# Patient Record
Sex: Male | Born: 1950 | Race: White | Hispanic: No | Marital: Single | Smoking: Former smoker
Health system: Southern US, Community
[De-identification: ages and names within clinical notes are randomized; demographics above are authoritative.]

## PROBLEM LIST (undated history)

## (undated) HISTORY — PX: WISDOM TOOTH EXTRACTION: SHX21

---

## 2015-04-24 ENCOUNTER — Ambulatory Visit (INDEPENDENT_AMBULATORY_CARE_PROVIDER_SITE_OTHER): Payer: Medicare Other

## 2015-04-24 ENCOUNTER — Ambulatory Visit (INDEPENDENT_AMBULATORY_CARE_PROVIDER_SITE_OTHER): Payer: Medicare Other | Admitting: Physician Assistant

## 2015-04-24 ENCOUNTER — Inpatient Hospital Stay (HOSPITAL_COMMUNITY)
Admission: EM | Admit: 2015-04-24 | Discharge: 2015-05-28 | DRG: 215 | Disposition: E | Payer: Medicare Other | Attending: Cardiology | Admitting: Cardiology

## 2015-04-24 ENCOUNTER — Encounter (HOSPITAL_COMMUNITY): Payer: Self-pay | Admitting: *Deleted

## 2015-04-24 ENCOUNTER — Encounter: Payer: Self-pay | Admitting: Physician Assistant

## 2015-04-24 ENCOUNTER — Emergency Department (HOSPITAL_COMMUNITY): Payer: Medicare Other

## 2015-04-24 VITALS — BP 121/79 | HR 73 | Ht 76.0 in | Wt 205.0 lb

## 2015-04-24 DIAGNOSIS — E039 Hypothyroidism, unspecified: Secondary | ICD-10-CM | POA: Diagnosis present

## 2015-04-24 DIAGNOSIS — R002 Palpitations: Secondary | ICD-10-CM

## 2015-04-24 DIAGNOSIS — E872 Acidosis, unspecified: Secondary | ICD-10-CM

## 2015-04-24 DIAGNOSIS — J9690 Respiratory failure, unspecified, unspecified whether with hypoxia or hypercapnia: Secondary | ICD-10-CM

## 2015-04-24 DIAGNOSIS — J9691 Respiratory failure, unspecified with hypoxia: Secondary | ICD-10-CM | POA: Diagnosis present

## 2015-04-24 DIAGNOSIS — Z452 Encounter for adjustment and management of vascular access device: Secondary | ICD-10-CM

## 2015-04-24 DIAGNOSIS — D649 Anemia, unspecified: Secondary | ICD-10-CM | POA: Diagnosis not present

## 2015-04-24 DIAGNOSIS — I480 Paroxysmal atrial fibrillation: Secondary | ICD-10-CM

## 2015-04-24 DIAGNOSIS — I5021 Acute systolic (congestive) heart failure: Secondary | ICD-10-CM | POA: Diagnosis not present

## 2015-04-24 DIAGNOSIS — K56609 Unspecified intestinal obstruction, unspecified as to partial versus complete obstruction: Secondary | ICD-10-CM

## 2015-04-24 DIAGNOSIS — E871 Hypo-osmolality and hyponatremia: Secondary | ICD-10-CM | POA: Diagnosis not present

## 2015-04-24 DIAGNOSIS — I248 Other forms of acute ischemic heart disease: Secondary | ICD-10-CM | POA: Diagnosis present

## 2015-04-24 DIAGNOSIS — Z87891 Personal history of nicotine dependence: Secondary | ICD-10-CM

## 2015-04-24 DIAGNOSIS — N189 Chronic kidney disease, unspecified: Secondary | ICD-10-CM | POA: Diagnosis present

## 2015-04-24 DIAGNOSIS — I4891 Unspecified atrial fibrillation: Secondary | ICD-10-CM | POA: Diagnosis present

## 2015-04-24 DIAGNOSIS — I481 Persistent atrial fibrillation: Secondary | ICD-10-CM

## 2015-04-24 DIAGNOSIS — Z66 Do not resuscitate: Secondary | ICD-10-CM | POA: Diagnosis not present

## 2015-04-24 DIAGNOSIS — Z79899 Other long term (current) drug therapy: Secondary | ICD-10-CM

## 2015-04-24 DIAGNOSIS — R52 Pain, unspecified: Secondary | ICD-10-CM

## 2015-04-24 DIAGNOSIS — J154 Pneumonia due to other streptococci: Secondary | ICD-10-CM | POA: Diagnosis not present

## 2015-04-24 DIAGNOSIS — Z4659 Encounter for fitting and adjustment of other gastrointestinal appliance and device: Secondary | ICD-10-CM

## 2015-04-24 DIAGNOSIS — I071 Rheumatic tricuspid insufficiency: Secondary | ICD-10-CM | POA: Diagnosis present

## 2015-04-24 DIAGNOSIS — R570 Cardiogenic shock: Secondary | ICD-10-CM | POA: Diagnosis present

## 2015-04-24 DIAGNOSIS — Z7901 Long term (current) use of anticoagulants: Secondary | ICD-10-CM

## 2015-04-24 DIAGNOSIS — J9811 Atelectasis: Secondary | ICD-10-CM | POA: Diagnosis present

## 2015-04-24 DIAGNOSIS — N170 Acute kidney failure with tubular necrosis: Secondary | ICD-10-CM | POA: Diagnosis present

## 2015-04-24 DIAGNOSIS — K7290 Hepatic failure, unspecified without coma: Secondary | ICD-10-CM

## 2015-04-24 DIAGNOSIS — Z9289 Personal history of other medical treatment: Secondary | ICD-10-CM

## 2015-04-24 DIAGNOSIS — A419 Sepsis, unspecified organism: Secondary | ICD-10-CM | POA: Diagnosis not present

## 2015-04-24 DIAGNOSIS — R739 Hyperglycemia, unspecified: Secondary | ICD-10-CM | POA: Diagnosis present

## 2015-04-24 DIAGNOSIS — R140 Abdominal distension (gaseous): Secondary | ICD-10-CM

## 2015-04-24 DIAGNOSIS — K7200 Acute and subacute hepatic failure without coma: Secondary | ICD-10-CM | POA: Diagnosis not present

## 2015-04-24 DIAGNOSIS — I4819 Other persistent atrial fibrillation: Secondary | ICD-10-CM

## 2015-04-24 DIAGNOSIS — I509 Heart failure, unspecified: Secondary | ICD-10-CM

## 2015-04-24 DIAGNOSIS — J9601 Acute respiratory failure with hypoxia: Secondary | ICD-10-CM

## 2015-04-24 DIAGNOSIS — K567 Ileus, unspecified: Secondary | ICD-10-CM | POA: Diagnosis not present

## 2015-04-24 DIAGNOSIS — E0781 Sick-euthyroid syndrome: Secondary | ICD-10-CM | POA: Diagnosis present

## 2015-04-24 DIAGNOSIS — R34 Anuria and oliguria: Secondary | ICD-10-CM | POA: Diagnosis not present

## 2015-04-24 DIAGNOSIS — N179 Acute kidney failure, unspecified: Secondary | ICD-10-CM

## 2015-04-24 DIAGNOSIS — I4901 Ventricular fibrillation: Secondary | ICD-10-CM | POA: Diagnosis not present

## 2015-04-24 DIAGNOSIS — Z8673 Personal history of transient ischemic attack (TIA), and cerebral infarction without residual deficits: Secondary | ICD-10-CM

## 2015-04-24 DIAGNOSIS — Z01818 Encounter for other preprocedural examination: Secondary | ICD-10-CM

## 2015-04-24 DIAGNOSIS — E8809 Other disorders of plasma-protein metabolism, not elsewhere classified: Secondary | ICD-10-CM | POA: Diagnosis present

## 2015-04-24 DIAGNOSIS — I130 Hypertensive heart and chronic kidney disease with heart failure and stage 1 through stage 4 chronic kidney disease, or unspecified chronic kidney disease: Secondary | ICD-10-CM | POA: Diagnosis present

## 2015-04-24 DIAGNOSIS — I340 Nonrheumatic mitral (valve) insufficiency: Secondary | ICD-10-CM | POA: Diagnosis present

## 2015-04-24 DIAGNOSIS — R0602 Shortness of breath: Secondary | ICD-10-CM | POA: Diagnosis not present

## 2015-04-24 DIAGNOSIS — R1310 Dysphagia, unspecified: Secondary | ICD-10-CM | POA: Diagnosis not present

## 2015-04-24 DIAGNOSIS — Z515 Encounter for palliative care: Secondary | ICD-10-CM | POA: Diagnosis not present

## 2015-04-24 DIAGNOSIS — D696 Thrombocytopenia, unspecified: Secondary | ICD-10-CM | POA: Diagnosis not present

## 2015-04-24 DIAGNOSIS — G9341 Metabolic encephalopathy: Secondary | ICD-10-CM | POA: Diagnosis not present

## 2015-04-24 DIAGNOSIS — I429 Cardiomyopathy, unspecified: Secondary | ICD-10-CM | POA: Diagnosis present

## 2015-04-24 LAB — CBC WITH DIFFERENTIAL/PLATELET
BASOS ABS: 0 10*3/uL (ref 0.0–0.1)
BASOS PCT: 0 % (ref 0–1)
Eosinophils Absolute: 0.1 10*3/uL (ref 0.0–0.7)
Eosinophils Relative: 1 % (ref 0–5)
HCT: 49.8 % (ref 39.0–52.0)
HEMOGLOBIN: 17.4 g/dL — AB (ref 13.0–17.0)
Lymphocytes Relative: 11 % — ABNORMAL LOW (ref 12–46)
Lymphs Abs: 1.2 10*3/uL (ref 0.7–4.0)
MCH: 32.7 pg (ref 26.0–34.0)
MCHC: 34.9 g/dL (ref 30.0–36.0)
MCV: 93.6 fL (ref 78.0–100.0)
MONOS PCT: 7 % (ref 3–12)
MPV: 10.9 fL (ref 8.6–12.4)
Monocytes Absolute: 0.8 10*3/uL (ref 0.1–1.0)
NEUTROS ABS: 8.8 10*3/uL — AB (ref 1.7–7.7)
NEUTROS PCT: 81 % — AB (ref 43–77)
Platelets: 268 10*3/uL (ref 150–400)
RBC: 5.32 MIL/uL (ref 4.22–5.81)
RDW: 14.5 % (ref 11.5–15.5)
WBC: 10.9 10*3/uL — ABNORMAL HIGH (ref 4.0–10.5)

## 2015-04-24 LAB — CBC
HCT: 52.6 % — ABNORMAL HIGH (ref 39.0–52.0)
Hemoglobin: 18.7 g/dL — ABNORMAL HIGH (ref 13.0–17.0)
MCH: 33 pg (ref 26.0–34.0)
MCHC: 35.6 g/dL (ref 30.0–36.0)
MCV: 92.8 fL (ref 78.0–100.0)
PLATELETS: 260 10*3/uL (ref 150–400)
RBC: 5.67 MIL/uL (ref 4.22–5.81)
RDW: 13.8 % (ref 11.5–15.5)
WBC: 12 10*3/uL — ABNORMAL HIGH (ref 4.0–10.5)

## 2015-04-24 LAB — COMPLETE METABOLIC PANEL WITH GFR
ALT: 84 U/L — AB (ref 9–46)
AST: 44 U/L — ABNORMAL HIGH (ref 10–35)
Albumin: 3.9 g/dL (ref 3.6–5.1)
Alkaline Phosphatase: 130 U/L — ABNORMAL HIGH (ref 40–115)
BILIRUBIN TOTAL: 2.1 mg/dL — AB (ref 0.2–1.2)
BUN: 34 mg/dL — ABNORMAL HIGH (ref 7–25)
CO2: 20 mmol/L (ref 20–31)
Calcium: 9.5 mg/dL (ref 8.6–10.3)
Chloride: 101 mmol/L (ref 98–110)
Creat: 1.73 mg/dL — ABNORMAL HIGH (ref 0.70–1.25)
GFR, EST NON AFRICAN AMERICAN: 41 mL/min — AB (ref >=60)
GFR, Est African American: 47 mL/min — ABNORMAL LOW (ref >=60)
GLUCOSE: 146 mg/dL — AB (ref 65–99)
Potassium: 4.6 mmol/L (ref 3.5–5.3)
SODIUM: 136 mmol/L (ref 135–146)
TOTAL PROTEIN: 7.2 g/dL (ref 6.1–8.1)

## 2015-04-24 LAB — I-STAT TROPONIN, ED: TROPONIN I, POC: 0.08 ng/mL (ref 0.00–0.08)

## 2015-04-24 LAB — BASIC METABOLIC PANEL
Anion gap: 17 — ABNORMAL HIGH (ref 5–15)
BUN: 42 mg/dL — AB (ref 6–20)
CALCIUM: 9.2 mg/dL (ref 8.9–10.3)
CO2: 16 mmol/L — AB (ref 22–32)
CREATININE: 2.21 mg/dL — AB (ref 0.61–1.24)
Chloride: 101 mmol/L (ref 101–111)
GFR calc non Af Amer: 30 mL/min — ABNORMAL LOW (ref >60)
GFR, EST AFRICAN AMERICAN: 34 mL/min — AB (ref >60)
GLUCOSE: 229 mg/dL — AB (ref 65–99)
Potassium: 5.1 mmol/L (ref 3.5–5.1)
Sodium: 134 mmol/L — ABNORMAL LOW (ref 135–145)

## 2015-04-24 LAB — BRAIN NATRIURETIC PEPTIDE: BRAIN NATRIURETIC PEPTIDE: 549.1 pg/mL — AB (ref <100)

## 2015-04-24 LAB — CK TOTAL AND CKMB (NOT AT ARMC)
CK, MB: 11.2 ng/mL — ABNORMAL HIGH (ref 0.0–5.0)
RELATIVE INDEX: 3 (ref 0.0–4.0)
Total CK: 372 U/L — ABNORMAL HIGH (ref 7–232)

## 2015-04-24 LAB — TROPONIN I: TROPONIN I: 0.06 ng/mL — AB (ref <=0.05)

## 2015-04-24 LAB — TSH: TSH: 6 m[IU]/L — AB (ref 0.40–4.50)

## 2015-04-24 MED ORDER — METOPROLOL TARTRATE 50 MG PO TABS: 50.0000 mg | ORAL_TABLET | Freq: Two times a day (BID) | ORAL | Status: AC

## 2015-04-24 MED ORDER — FUROSEMIDE 10 MG/ML IJ SOLN
80.0000 mg | Freq: Once | INTRAMUSCULAR | Status: AC
  Administered 2015-04-25: 80 mg via INTRAVENOUS
  Filled 2015-04-24: qty 8

## 2015-04-24 MED ORDER — APIXABAN 5 MG PO TABS: 5.0000 mg | ORAL_TABLET | Freq: Two times a day (BID) | ORAL | Status: AC

## 2015-04-24 NOTE — ED Provider Notes (Addendum)
CSN: [id number]     Arrival date & time 05/10/15  2207 History   By signing my name below, I, Wesley Tyler, attest that this documentation has been prepared under the direction and in the presence of Wesley Crease, MD.  Electronically Signed: Arlan Tyler, ED Scribe. 05/10/2015. 11:33 PM.   Chief Complaint  Patient presents with  . Shortness of Breath  . Abnormal Lab   The history is provided by the patient. No language interpreter was used.    HPI Comments: Wesley Tyler is a 65 y.o. male with a PMHx of stroke, HTN, and heart attack who presents to the Emergency Department complaining of constant, ongoing shortness of breath x 2.5 weeks; worsened in last few days. Shortness of breath is exacerbated when flat and with exertion. No alleviating factors at this time. Pt also reports new onset lower extremity edema. No OTC/prescribed medications or home remedies attempted prior to arrival. No recent fever, chills, nausea, vomiting, diarrhea, or chest pain. Mr. Wesley Tyler was evaluated at [facility] earlier today and was advised to come to the Emergency Department for further evaluation after abnormal labs came back.  PCP: Wesley Gaw, PA-C    History reviewed. No pertinent past medical history. History reviewed. No pertinent past surgical history. Family History  Problem Relation Age of Onset  . Stroke Mother   . ALS Father   . Heart attack Maternal Aunt   . Hypertension Maternal Aunt    Social History  Substance Use Topics  . Smoking status: Former Games developer  . Smokeless tobacco: None  . Alcohol Use: 4.2 - 8.4 oz/week    7-14 Cans of beer per week     Comment: pt states 1-2 beers per night    Review of Systems  Constitutional: Negative for fever and chills.  Respiratory: Positive for shortness of breath.   Cardiovascular: Positive for leg swelling. Negative for chest pain.  Gastrointestinal: Negative for nausea, vomiting and abdominal pain.  Neurological:  Negative for numbness.  Psychiatric/Behavioral: Negative for confusion.  All other systems reviewed and are negative.     Allergies  Review of patient's allergies indicates no known allergies.  Home Medications   Prior to Admission medications   Medication Sig Start Date End Date Taking? Authorizing Provider  apixaban (ELIQUIS) 5 MG TABS tablet Take 1 tablet (5 mg total) by mouth 2 (two) times daily. May 10, 2015  Yes Jade L Breeback, PA-C  metoprolol (LOPRESSOR) 50 MG tablet Take 1 tablet (50 mg total) by mouth 2 (two) times daily. 05/10/15  Yes Jade L Breeback, PA-C  Multiple Vitamins-Minerals (CENTRUM ADULTS PO) Take 1 tablet by mouth daily.   Yes Historical Provider, MD  Multiple Vitamins-Minerals (EMERGEN-C IMMUNE PO) Take 1 each by mouth daily.   Yes Historical Provider, MD   Triage Vitals: BP 104/76 mmHg  Pulse 101  Temp(Src) 99.3 F (37.4 C) (Core (Comment))  Resp 24  SpO2 96%   Physical Exam  Constitutional: He is oriented to person, place, and time. He appears well-developed and well-nourished. No distress.  HENT:  Head: Normocephalic and atraumatic.  Right Ear: Hearing normal.  Left Ear: Hearing normal.  Nose: Nose normal.  Mouth/Throat: Oropharynx is clear and moist and mucous membranes are normal.  Eyes: Conjunctivae and EOM are normal. Pupils are equal, round, and reactive to light.  Neck: Normal range of motion. Neck supple. JVD present.  Cardiovascular: S1 normal and S2 normal.  An irregular rhythm present. Exam reveals no gallop and no friction rub.  No murmur heard. Pulmonary/Chest: No respiratory distress. He exhibits no tenderness.  Breath sounds are diminished with bilateral crackles at the bases   Abdominal: Soft. Normal appearance and bowel sounds are normal. There is no hepatosplenomegaly. There is no tenderness. There is no rebound, no guarding, no tenderness at McBurney's point and negative Murphy's sign. No hernia.  Musculoskeletal: Normal range of  motion. He exhibits edema.  3+ lower extremity edema noted   Neurological: He is alert and oriented to person, place, and time. He has normal strength. No cranial nerve deficit or sensory deficit. Coordination normal. GCS eye subscore is 4. GCS verbal subscore is 5. GCS motor subscore is 6.  Skin: Skin is warm, dry and intact. No rash noted. No cyanosis.  Psychiatric: He has a normal mood and affect. His speech is normal and behavior is normal. Thought content normal.  Nursing note and vitals reviewed.   ED Course  .Central Line Date/Time: 04/09/2015 5:45 AM Performed by: Wesley CreasePOLLINA, CHRISTOPHER J Authorized by: Wesley CreasePOLLINA, CHRISTOPHER J Consent: Verbal consent obtained. Written consent obtained. Risks and benefits: risks, benefits and alternatives were discussed Consent given by: patient Patient understanding: patient states understanding of the procedure being performed Patient consent: the patient's understanding of the procedure matches consent given Procedure consent: procedure consent matches procedure scheduled Relevant documents: relevant documents present and verified Test results: test results available and properly labeled Site marked: the operative site was marked Imaging studies: imaging studies available Required items: required blood products, implants, devices, and special equipment available Patient identity confirmed: verbally with patient and hospital-assigned identification number Time out: Immediately prior to procedure a "time out" was called to verify the correct patient, procedure, equipment, support staff and site/side marked as required. Indications: vascular access and central pressure monitoring Anesthesia: local infiltration Local anesthetic: lidocaine 1% without epinephrine Anesthetic total: 3 ml Patient sedated: no Preparation: skin prepped with 2% chlorhexidine Skin prep agent dried: skin prep agent completely dried prior to procedure Sterile barriers: all  five maximum sterile barriers used - cap, mask, sterile gown, sterile gloves, and large sterile sheet Hand hygiene: hand hygiene performed prior to central venous catheter insertion Location details: right internal jugular Patient position: flat Catheter type: triple lumen Catheter size: 7.5 Fr Pre-procedure: landmarks identified Ultrasound guidance: yes Sterile ultrasound techniques: sterile gel and sterile probe covers were used Number of attempts: 1 Successful placement: yes Post-procedure: line sutured and dressing applied Assessment: blood return through all ports,  free fluid flow,  no pneumothorax on x-ray and placement verified by x-ray Patient tolerance: Patient tolerated the procedure well with no immediate complications   (including critical care time)  DIAGNOSTIC STUDIES: Oxygen Saturation is 100% on Oxygen, Normal by my interpretation.    COORDINATION OF CARE: 11:28 PM- Will order EKG, blood work, and CXR. Discussed treatment plan with pt at bedside and pt agreed to plan.     Labs Review Labs Reviewed  BASIC METABOLIC PANEL - Abnormal; Notable for the following:    Sodium 134 (*)    CO2 16 (*)    Glucose, Bld 229 (*)    BUN 42 (*)    Creatinine, Ser 2.21 (*)    GFR calc non Af Amer 30 (*)    GFR calc Af Amer 34 (*)    Anion gap 17 (*)    All other components within normal limits  CBC - Abnormal; Notable for the following:    WBC 12.0 (*)    Hemoglobin 18.7 (*)    HCT  52.6 (*)    All other components within normal limits  HEPATIC FUNCTION PANEL - Abnormal; Notable for the following:    AST 189 (*)    ALT 151 (*)    Alkaline Phosphatase 186 (*)    Total Bilirubin 3.9 (*)    Bilirubin, Direct 1.4 (*)    Indirect Bilirubin 2.5 (*)    All other components within normal limits  URINALYSIS, ROUTINE W REFLEX MICROSCOPIC (NOT AT [facility]) - Abnormal; Notable for the following:    Color, Urine AMBER (*)    APPearance CLOUDY (*)    Hgb urine dipstick LARGE (*)     Bilirubin Urine MODERATE (*)    Protein, ur 100 (*)    Leukocytes, UA SMALL (*)    All other components within normal limits  BRAIN NATRIURETIC PEPTIDE - Abnormal; Notable for the following:    B Natriuretic Peptide 612.8 (*)    All other components within normal limits  URINE MICROSCOPIC-ADD ON - Abnormal; Notable for the following:    Squamous Epithelial / LPF 0-5 (*)    Bacteria, UA MANY (*)    Casts HYALINE CASTS (*)    All other components within normal limits  BLOOD GAS, ARTERIAL - Abnormal; Notable for the following:    pH, Arterial 7.459 (*)    pCO2 arterial 17.1 (*)    pO2, Arterial 457 (*)    Bicarbonate 12.1 (*)    Acid-base deficit 8.9 (*)    All other components within normal limits  TROPONIN I - Abnormal; Notable for the following:    Troponin I 0.10 (*)    All other components within normal limits  LACTIC ACID, PLASMA - Abnormal; Notable for the following:    Lactic Acid, Venous 2.7 (*)    All other components within normal limits  I-STAT CG4 LACTIC ACID, ED - Abnormal; Notable for the following:    Lactic Acid, Venous 4.32 (*)    All other components within normal limits  I-STAT CG4 LACTIC ACID, ED - Abnormal; Notable for the following:    Lactic Acid, Venous 4.53 (*)    All other components within normal limits  CBG MONITORING, ED - Abnormal; Notable for the following:    Glucose-Capillary 146 (*)    All other components within normal limits  PROCALCITONIN  T4, FREE  TROPONIN I  LACTIC ACID, PLASMA  HEMOGLOBIN A1C  HEPARIN LEVEL (UNFRACTIONATED)  APTT  CARBOXYHEMOGLOBIN  I-STAT TROPOININ, ED    Imaging Review Dg Chest 2 View  04/12/2015  CLINICAL DATA:  Shortness of breath, decreased oxygen saturation, atrial fibrillation. Former smoker. EXAM: CHEST  2 VIEW COMPARISON:  None in PACs FINDINGS: The lungs are adequately inflated. There small bilateral pleural effusions. The pulmonary interstitial markings are increased. The heart is top-normal in size. The  central pulmonary vascularity is prominent. There is mild loss of height of the body of approximately T8. IMPRESSION: Small bilateral pleural effusions with mild interstitial prominence suggests low-grade CHF. Chronic interstitial prominence could also be secondary to the patient's smoking history or to early interstitial pneumonia. Without previous studies it is difficult to be more precise. Electronically Signed   By: David  [place] M.D.   On: 04/05/2015 16:56   Dg Chest Portable 1 View  04/27/2015  CLINICAL DATA:  Central line placement.  Initial encounter. EXAM: PORTABLE CHEST 1 VIEW COMPARISON:  Chest radiograph 03/27/2015 FINDINGS: The patient's right IJ line is seen ending about the distal SVC. A small right pleural effusion is noted,  with associated right basilar airspace opacity likely reflecting atelectasis. There is mild elevation of the left hemidiaphragm. No pneumothorax is seen. The cardiomediastinal silhouette is borderline enlarged. No acute osseous abnormalities are identified. IMPRESSION: 1. Right IJ line noted ending about the distal SVC. 2. Small right pleural effusion, with associated right basilar airspace opacity likely reflecting atelectasis. Mild elevation of the left hemidiaphragm. 3. Borderline cardiomegaly. Electronically Signed   By: Roanna Raider M.D.   On: 2015-05-04 05:51   Dg Chest Port 1 View  04/02/2015  CLINICAL DATA:  Acute onset of hypotension and hypothermia. Atrial fibrillation. Initial encounter. EXAM: PORTABLE CHEST 1 VIEW COMPARISON:  Chest radiograph performed earlier today at 4:58 p.m. FINDINGS: The lungs are well-aerated. Mild bibasilar opacities likely reflect atelectasis. There is no evidence of pleural effusion or pneumothorax. The cardiomediastinal silhouette is borderline normal in size. No acute osseous abnormalities are seen. IMPRESSION: Mild bibasilar airspace opacities likely reflect atelectasis. Lungs otherwise clear. Electronically Signed   By: Roanna Raider M.D.   On: 04/01/2015 23:51   I have personally reviewed and evaluated these images and lab results as part of my medical decision-making.   EKG Interpretation   Date/Time:  Tuesday April 24 2015 22:25:02 EDT Ventricular Rate:  89 PR Interval:    QRS Duration: 177 QT Interval:  477 QTC Calculation: 580 R Axis:   -50 Text Interpretation:  Atrial fibrillation Left bundle branch block  Baseline wander in lead(s) V3 No previous tracing Confirmed by POLLINA   MD, CHRISTOPHER [phone]) on May 04, 2015 12:45:03 AM      MDM   Final diagnoses:  Acute congestive heart failure, unspecified congestive heart failure type (HCC)  AKI (acute kidney injury) (HCC)  Persistent atrial fibrillation (HCC)  Acute respiratory failure with hypoxia (HCC)  Lactic acidosis   Patient referred to the ER from urgent care. Patient reports that he has been experiencing heart palpitations and shortness of breath for 2-1/2 weeks. He has noticed progressive swelling of his legs over this period of time. He does not have any cardiac history. He visited urgent care and was found to be in atrial fibrillation with rapid ventricular response. He was started on Lopressor and Eliquis, arrangements were made for him to follow with cardiology. Patient had blood work drawn at the urgent care, however, and results showed elevation of troponin. Patient was called at home and told to go to the ER.  Upon arrival, patient appears acutely ill. He is dyspneic and exhibiting signs of significant right heart failure. He was administered IV Lasix upon initial evaluation.  Bloodwork reveals anion gap acidosis. Lactate returned at 4.3. He has an elevated BNP. There is elevated LFTs and acute kidney injury as well.  Discussed with Dr. Arsenio Loader, critical care. Patient will be seen in the ER for further management, but we discussed initiating pressures. We agreed on dobutamine because the patient appears to be exhibiting acute congestive  heart failure. A central line was placed by myself without difficulty.  CRITICAL CARE Performed by: Wesley Tyler   Total critical care time: 60 minutes  Critical care time was exclusive of separately billable procedures and treating other patients.  Critical care was necessary to treat or prevent imminent or life-threatening deterioration.  Critical care was time spent personally by me on the following activities: development of treatment plan with patient and/or surrogate as well as nursing, discussions with consultants, evaluation of patient's response to treatment, examination of patient, obtaining history from patient or surrogate, ordering and  performing treatments and interventions, ordering and review of laboratory studies, ordering and review of radiographic studies, pulse oximetry and re-evaluation of patient's condition.    I personally performed the services described in this documentation, which was scribed in my presence. The recorded information has been reviewed and is accurate.   Wesley Crease, MD 04/06/2015 [id number]  Wesley Crease, MD 04/12/2015 [phone]

## 2015-04-24 NOTE — ED Notes (Signed)
Pt reports that he has been short of breath x 2 weeks that has been getting progressively worse; pt states that the shortness of breath is worse with exertion; pt states that he has not been seen by a physician in years and [facility]; pt states that the MD called and advised that pt needed to be seen tonight for abnormal blood work

## 2015-04-24 NOTE — Patient Instructions (Signed)

## 2015-04-24 NOTE — Progress Notes (Signed)
Subjective:    Patient ID: Wesley Tyler, male    DOB: 06/27/1950, 65 y.o.   MRN: [id number]  HPI Pt is a 65 yo male who presents to the clinic to establish care. He comes with his cousin who made him come today.   He denies any past medication history or taking any medications.   .. Family History  Problem Relation Age of Onset  . Stroke Mother   . ALS Father   . Heart attack Maternal Aunt   . Hypertension Maternal Aunt    . Social History   Social History  . Marital Status: Single    Spouse Name: N/A  . Number of Children: N/A  . Years of Education: N/A   Occupational History  . Not on file.   Social History Main Topics  . Smoking status: Former Games developermoker  . Smokeless tobacco: Not on file  . Alcohol Use: No  . Drug Use: No  . Sexual Activity: No   Other Topics Concern  . Not on file   Social History Narrative  . No narrative on file   He does not smoke and has been quit since 1977.  Pt has not seen health care provider since 20's. Pt reports 1 plus years of occasional palpations that are off and on. He reports they have progressively gotten worse over the past 6 months. For the past 2 weeks his heart has been continually beating "fast and hard". He usually can eat some cashews and helps but nothing has helped for the past 2 weeks. He is very SOB and notice that his ankles are swelling more. He has a cough that is mildly productive. No wheezing. Not taken anything to make better. No fever, body aches or chills. No CP.    Review of Systems    see HPI. Objective:   Physical Exam  Constitutional: He is oriented to person, place, and time.  Pale, diaphoretic, labored breathing.   HENT:  Head: Normocephalic and atraumatic.  Right Ear: External ear normal.  Left Ear: External ear normal.  Nose: Nose normal.  Mouth/Throat: Oropharynx is clear and moist. No oropharyngeal exudate.  Eyes: Conjunctivae are normal. Right eye exhibits no discharge. Left eye exhibits  no discharge.  Neck: Normal range of motion. Neck supple. No JVD present. No tracheal deviation present. No thyromegaly present.  Cardiovascular:  Tachycardia irregular irregular rhythm EKG confirms Atrial fibrillation.  Pulmonary/Chest: No stridor.  Decreased effort.  No wheezing or rhonchi. Base of right lung heard sparse crackles.  Lymphadenopathy:    He has no cervical adenopathy.  Neurological: He is alert and oriented to person, place, and time.  Skin: Skin is dry.  2 + pitting edema lower bilateral extremity.   Psychiatric: He has a normal mood and affect. His behavior is normal.          Assessment & Plan:  Paroxysmal atrial fibrillation likely converted to persistent atrial fibrillation in the past 2 weeks-  EKG confirmed irregular tachycardic HR at 132. QRS widening.  Pulse ox 94 percent. Started on metoprolol 50mg  bid.  Eliquis 5mg  bid. Coupon card given. Stat labs to check TSH, CBC, CMP, BNP, cardiac enzymes, Troponin. STAT CXR to rule out any complication factor such as pneumonia.  Pt has Cardiologist appt in [place] at 9am with Dr. Cliffton AstersWhite.  Current CHADs score is low at 1 but many variable we do not know since has not seen doctor since 20's.  Follow up with myself in 4 weeks.  Discussed with patient need to also consider lipid checking for better management of chronic conditions and risk assessment.

## 2015-04-25 ENCOUNTER — Encounter (HOSPITAL_COMMUNITY): Payer: Self-pay | Admitting: Cardiology

## 2015-04-25 ENCOUNTER — Inpatient Hospital Stay (HOSPITAL_COMMUNITY): Payer: Medicare Other

## 2015-04-25 ENCOUNTER — Other Ambulatory Visit (HOSPITAL_COMMUNITY): Payer: Medicare Other

## 2015-04-25 ENCOUNTER — Encounter (HOSPITAL_COMMUNITY): Admission: EM | Disposition: E | Payer: Self-pay | Source: Home / Self Care | Attending: Pulmonary Disease

## 2015-04-25 DIAGNOSIS — I509 Heart failure, unspecified: Secondary | ICD-10-CM

## 2015-04-25 DIAGNOSIS — R079 Chest pain, unspecified: Secondary | ICD-10-CM | POA: Diagnosis not present

## 2015-04-25 DIAGNOSIS — A419 Sepsis, unspecified organism: Secondary | ICD-10-CM | POA: Diagnosis not present

## 2015-04-25 DIAGNOSIS — Z515 Encounter for palliative care: Secondary | ICD-10-CM | POA: Diagnosis not present

## 2015-04-25 DIAGNOSIS — E8729 Other acidosis: Secondary | ICD-10-CM | POA: Insufficient documentation

## 2015-04-25 DIAGNOSIS — I5021 Acute systolic (congestive) heart failure: Secondary | ICD-10-CM | POA: Diagnosis present

## 2015-04-25 DIAGNOSIS — R0602 Shortness of breath: Secondary | ICD-10-CM | POA: Diagnosis present

## 2015-04-25 DIAGNOSIS — I482 Chronic atrial fibrillation: Secondary | ICD-10-CM | POA: Diagnosis not present

## 2015-04-25 DIAGNOSIS — E8809 Other disorders of plasma-protein metabolism, not elsewhere classified: Secondary | ICD-10-CM | POA: Diagnosis present

## 2015-04-25 DIAGNOSIS — N189 Chronic kidney disease, unspecified: Secondary | ICD-10-CM | POA: Diagnosis present

## 2015-04-25 DIAGNOSIS — E0781 Sick-euthyroid syndrome: Secondary | ICD-10-CM | POA: Diagnosis present

## 2015-04-25 DIAGNOSIS — E872 Acidosis: Secondary | ICD-10-CM

## 2015-04-25 DIAGNOSIS — I071 Rheumatic tricuspid insufficiency: Secondary | ICD-10-CM | POA: Diagnosis present

## 2015-04-25 DIAGNOSIS — J810 Acute pulmonary edema: Secondary | ICD-10-CM | POA: Diagnosis not present

## 2015-04-25 DIAGNOSIS — Z79899 Other long term (current) drug therapy: Secondary | ICD-10-CM | POA: Diagnosis not present

## 2015-04-25 DIAGNOSIS — G9341 Metabolic encephalopathy: Secondary | ICD-10-CM | POA: Diagnosis not present

## 2015-04-25 DIAGNOSIS — N179 Acute kidney failure, unspecified: Secondary | ICD-10-CM | POA: Insufficient documentation

## 2015-04-25 DIAGNOSIS — R1310 Dysphagia, unspecified: Secondary | ICD-10-CM | POA: Diagnosis not present

## 2015-04-25 DIAGNOSIS — J9601 Acute respiratory failure with hypoxia: Secondary | ICD-10-CM | POA: Diagnosis not present

## 2015-04-25 DIAGNOSIS — D696 Thrombocytopenia, unspecified: Secondary | ICD-10-CM | POA: Diagnosis not present

## 2015-04-25 DIAGNOSIS — I481 Persistent atrial fibrillation: Secondary | ICD-10-CM | POA: Diagnosis not present

## 2015-04-25 DIAGNOSIS — I4891 Unspecified atrial fibrillation: Secondary | ICD-10-CM | POA: Diagnosis present

## 2015-04-25 DIAGNOSIS — R34 Anuria and oliguria: Secondary | ICD-10-CM | POA: Diagnosis not present

## 2015-04-25 DIAGNOSIS — K7200 Acute and subacute hepatic failure without coma: Secondary | ICD-10-CM | POA: Diagnosis not present

## 2015-04-25 DIAGNOSIS — E039 Hypothyroidism, unspecified: Secondary | ICD-10-CM | POA: Diagnosis present

## 2015-04-25 DIAGNOSIS — I351 Nonrheumatic aortic (valve) insufficiency: Secondary | ICD-10-CM | POA: Diagnosis not present

## 2015-04-25 DIAGNOSIS — I480 Paroxysmal atrial fibrillation: Secondary | ICD-10-CM | POA: Diagnosis not present

## 2015-04-25 DIAGNOSIS — J154 Pneumonia due to other streptococci: Secondary | ICD-10-CM | POA: Diagnosis not present

## 2015-04-25 DIAGNOSIS — I248 Other forms of acute ischemic heart disease: Secondary | ICD-10-CM | POA: Diagnosis present

## 2015-04-25 DIAGNOSIS — D649 Anemia, unspecified: Secondary | ICD-10-CM | POA: Diagnosis not present

## 2015-04-25 DIAGNOSIS — I130 Hypertensive heart and chronic kidney disease with heart failure and stage 1 through stage 4 chronic kidney disease, or unspecified chronic kidney disease: Secondary | ICD-10-CM | POA: Diagnosis present

## 2015-04-25 DIAGNOSIS — R739 Hyperglycemia, unspecified: Secondary | ICD-10-CM | POA: Diagnosis present

## 2015-04-25 DIAGNOSIS — N170 Acute kidney failure with tubular necrosis: Secondary | ICD-10-CM | POA: Diagnosis present

## 2015-04-25 DIAGNOSIS — K567 Ileus, unspecified: Secondary | ICD-10-CM | POA: Diagnosis not present

## 2015-04-25 DIAGNOSIS — I361 Nonrheumatic tricuspid (valve) insufficiency: Secondary | ICD-10-CM | POA: Diagnosis not present

## 2015-04-25 DIAGNOSIS — G9340 Encephalopathy, unspecified: Secondary | ICD-10-CM | POA: Diagnosis not present

## 2015-04-25 DIAGNOSIS — R570 Cardiogenic shock: Secondary | ICD-10-CM

## 2015-04-25 DIAGNOSIS — I429 Cardiomyopathy, unspecified: Secondary | ICD-10-CM | POA: Diagnosis present

## 2015-04-25 DIAGNOSIS — Z8673 Personal history of transient ischemic attack (TIA), and cerebral infarction without residual deficits: Secondary | ICD-10-CM | POA: Diagnosis not present

## 2015-04-25 DIAGNOSIS — Z66 Do not resuscitate: Secondary | ICD-10-CM | POA: Diagnosis not present

## 2015-04-25 DIAGNOSIS — Z87891 Personal history of nicotine dependence: Secondary | ICD-10-CM | POA: Diagnosis not present

## 2015-04-25 DIAGNOSIS — I4901 Ventricular fibrillation: Secondary | ICD-10-CM | POA: Diagnosis not present

## 2015-04-25 DIAGNOSIS — J9691 Respiratory failure, unspecified with hypoxia: Secondary | ICD-10-CM | POA: Diagnosis present

## 2015-04-25 DIAGNOSIS — Z7901 Long term (current) use of anticoagulants: Secondary | ICD-10-CM | POA: Diagnosis not present

## 2015-04-25 DIAGNOSIS — J9811 Atelectasis: Secondary | ICD-10-CM | POA: Diagnosis present

## 2015-04-25 DIAGNOSIS — E871 Hypo-osmolality and hyponatremia: Secondary | ICD-10-CM | POA: Diagnosis not present

## 2015-04-25 DIAGNOSIS — I340 Nonrheumatic mitral (valve) insufficiency: Secondary | ICD-10-CM | POA: Diagnosis present

## 2015-04-25 HISTORY — PX: CARDIAC CATHETERIZATION: SHX172

## 2015-04-25 LAB — URINALYSIS, ROUTINE W REFLEX MICROSCOPIC
GLUCOSE, UA: NEGATIVE mg/dL
KETONES UR: NEGATIVE mg/dL
Nitrite: NEGATIVE
PROTEIN: 100 mg/dL — AB
Specific Gravity, Urine: 1.021 (ref 1.005–1.030)
pH: 5 (ref 5.0–8.0)

## 2015-04-25 LAB — POCT I-STAT 3, VENOUS BLOOD GAS (G3P V)
ACID-BASE DEFICIT: 6 mmol/L — AB (ref 0.0–2.0)
Acid-base deficit: 6 mmol/L — ABNORMAL HIGH (ref 0.0–2.0)
BICARBONATE: 19.9 meq/L — AB (ref 20.0–24.0)
BICARBONATE: 20 meq/L (ref 20.0–24.0)
O2 SAT: 65 %
O2 Saturation: 63 %
PCO2 VEN: 41 mmHg — AB (ref 45.0–50.0)
PO2 VEN: 37 mmHg (ref 31.0–45.0)
TCO2: 21 mmol/L (ref 0–100)
TCO2: 21 mmol/L (ref 0–100)
pCO2, Ven: 40.7 mmHg — ABNORMAL LOW (ref 45.0–50.0)
pH, Ven: 7.295 (ref 7.250–7.300)
pH, Ven: 7.299 (ref 7.250–7.300)
pO2, Ven: 36 mmHg (ref 31.0–45.0)

## 2015-04-25 LAB — CBC WITH DIFFERENTIAL/PLATELET
BASOS ABS: 0 10*3/uL (ref 0.0–0.1)
BASOS PCT: 0 %
Basophils Absolute: 0 10*3/uL (ref 0.0–0.1)
Basophils Relative: 0 %
EOS ABS: 0 10*3/uL (ref 0.0–0.7)
Eosinophils Absolute: 0 10*3/uL (ref 0.0–0.7)
Eosinophils Relative: 0 %
Eosinophils Relative: 0 %
HCT: 49.4 % (ref 39.0–52.0)
HCT: 44.7 % (ref 39.0–52.0)
HEMOGLOBIN: 17.2 g/dL — AB (ref 13.0–17.0)
Hemoglobin: 15.1 g/dL (ref 13.0–17.0)
LYMPHS ABS: 1.2 10*3/uL (ref 0.7–4.0)
LYMPHS PCT: 7 %
Lymphocytes Relative: 9 %
Lymphs Abs: 1.1 10*3/uL (ref 0.7–4.0)
MCH: 32.6 pg (ref 26.0–34.0)
MCH: 31.4 pg (ref 26.0–34.0)
MCHC: 34.8 g/dL (ref 30.0–36.0)
MCHC: 33.8 g/dL (ref 30.0–36.0)
MCV: 93.6 fL (ref 78.0–100.0)
MCV: 92.9 fL (ref 78.0–100.0)
MONO ABS: 0.8 10*3/uL (ref 0.1–1.0)
MONOS PCT: 6 %
Monocytes Absolute: 1.8 10*3/uL — ABNORMAL HIGH (ref 0.1–1.0)
Monocytes Relative: 11 %
NEUTROS PCT: 82 %
Neutro Abs: 13.1 10*3/uL — ABNORMAL HIGH (ref 1.7–7.7)
Neutro Abs: 10.6 10*3/uL — ABNORMAL HIGH (ref 1.7–7.7)
Neutrophils Relative %: 85 %
PLATELETS: 262 10*3/uL (ref 150–400)
PLATELETS: 217 10*3/uL (ref 150–400)
RBC: 5.28 MIL/uL (ref 4.22–5.81)
RBC: 4.81 MIL/uL (ref 4.22–5.81)
RDW: 13.8 % (ref 11.5–15.5)
RDW: 13.4 % (ref 11.5–15.5)
WBC: 16.1 10*3/uL — AB (ref 4.0–10.5)
WBC: 12.6 10*3/uL — ABNORMAL HIGH (ref 4.0–10.5)

## 2015-04-25 LAB — COMPREHENSIVE METABOLIC PANEL
ALBUMIN: 3 g/dL — AB (ref 3.5–5.0)
ALK PHOS: 136 U/L — AB (ref 38–126)
ALT: 765 U/L — ABNORMAL HIGH (ref 17–63)
ALT: 721 U/L — AB (ref 17–63)
AST: 1012 U/L — ABNORMAL HIGH (ref 15–41)
AST: 1177 U/L — ABNORMAL HIGH (ref 15–41)
Albumin: 3.3 g/dL — ABNORMAL LOW (ref 3.5–5.0)
Alkaline Phosphatase: 156 U/L — ABNORMAL HIGH (ref 38–126)
Anion gap: 13 (ref 5–15)
Anion gap: 15 (ref 5–15)
BILIRUBIN TOTAL: 2.4 mg/dL — AB (ref 0.3–1.2)
BILIRUBIN TOTAL: 2.5 mg/dL — AB (ref 0.3–1.2)
BUN: 50 mg/dL — ABNORMAL HIGH (ref 6–20)
BUN: 55 mg/dL — ABNORMAL HIGH (ref 6–20)
CALCIUM: 8.4 mg/dL — AB (ref 8.9–10.3)
CHLORIDE: 104 mmol/L (ref 101–111)
CO2: 19 mmol/L — ABNORMAL LOW (ref 22–32)
CO2: 18 mmol/L — AB (ref 22–32)
CREATININE: 2.81 mg/dL — AB (ref 0.61–1.24)
CREATININE: 2.93 mg/dL — AB (ref 0.61–1.24)
Calcium: 9 mg/dL (ref 8.9–10.3)
Chloride: 103 mmol/L (ref 101–111)
GFR calc non Af Amer: 21 mL/min — ABNORMAL LOW (ref >60)
GFR, EST AFRICAN AMERICAN: 26 mL/min — AB (ref >60)
GFR, EST AFRICAN AMERICAN: 24 mL/min — AB (ref >60)
GFR, EST NON AFRICAN AMERICAN: 22 mL/min — AB (ref >60)
GLUCOSE: 147 mg/dL — AB (ref 65–99)
Glucose, Bld: 142 mg/dL — ABNORMAL HIGH (ref 65–99)
POTASSIUM: 5.2 mmol/L — AB (ref 3.5–5.1)
Potassium: 4.6 mmol/L (ref 3.5–5.1)
SODIUM: 136 mmol/L (ref 135–145)
Sodium: 136 mmol/L (ref 135–145)
TOTAL PROTEIN: 6.3 g/dL — AB (ref 6.5–8.1)
TOTAL PROTEIN: 5.8 g/dL — AB (ref 6.5–8.1)

## 2015-04-25 LAB — I-STAT CG4 LACTIC ACID, ED
LACTIC ACID, VENOUS: 4.32 mmol/L — AB (ref 0.5–2.0)
Lactic Acid, Venous: 4.53 mmol/L (ref 0.5–2.0)

## 2015-04-25 LAB — HEPATIC FUNCTION PANEL
ALT: 151 U/L — ABNORMAL HIGH (ref 17–63)
AST: 189 U/L — ABNORMAL HIGH (ref 15–41)
Albumin: 4 g/dL (ref 3.5–5.0)
Alkaline Phosphatase: 186 U/L — ABNORMAL HIGH (ref 38–126)
BILIRUBIN DIRECT: 1.4 mg/dL — AB (ref 0.1–0.5)
BILIRUBIN TOTAL: 3.9 mg/dL — AB (ref 0.3–1.2)
Indirect Bilirubin: 2.5 mg/dL — ABNORMAL HIGH (ref 0.3–0.9)
Total Protein: 7.9 g/dL (ref 6.5–8.1)

## 2015-04-25 LAB — ECHOCARDIOGRAM LIMITED
HEIGHTINCHES: 76 in
Weight: 3559.11 oz

## 2015-04-25 LAB — BLOOD GAS, ARTERIAL
Acid-base deficit: 8.9 mmol/L — ABNORMAL HIGH (ref 0.0–2.0)
BICARBONATE: 12.1 meq/L — AB (ref 20.0–24.0)
FIO2: 1
O2 Content: 15 L/min
O2 SAT: 99.8 %
Patient temperature: 97.5
TCO2: 10 mmol/L (ref 0–100)
pCO2 arterial: 17.1 mmHg — CL (ref 35.0–45.0)
pH, Arterial: 7.459 — ABNORMAL HIGH (ref 7.350–7.450)
pO2, Arterial: 457 mmHg — ABNORMAL HIGH (ref 80.0–100.0)

## 2015-04-25 LAB — CARBOXYHEMOGLOBIN
CARBOXYHEMOGLOBIN: 0.7 % (ref 0.5–1.5)
CARBOXYHEMOGLOBIN: 0.6 % (ref 0.5–1.5)
METHEMOGLOBIN: 0.9 % (ref 0.0–1.5)
Methemoglobin: 0.8 % (ref 0.0–1.5)
O2 SAT: 35.7 %
O2 Saturation: 77.5 %
TOTAL HEMOGLOBIN: 16.2 g/dL (ref 13.5–18.0)
Total hemoglobin: 17.4 g/dL (ref 13.5–18.0)

## 2015-04-25 LAB — GLUCOSE, CAPILLARY: Glucose-Capillary: 133 mg/dL — ABNORMAL HIGH (ref 65–99)

## 2015-04-25 LAB — CBG MONITORING, ED
GLUCOSE-CAPILLARY: 146 mg/dL — AB (ref 65–99)
GLUCOSE-CAPILLARY: 149 mg/dL — AB (ref 65–99)

## 2015-04-25 LAB — PROCALCITONIN: Procalcitonin: 0.16 ng/mL

## 2015-04-25 LAB — POCT ACTIVATED CLOTTING TIME
ACTIVATED CLOTTING TIME: 219 s
ACTIVATED CLOTTING TIME: 142 s
Activated Clotting Time: 204 seconds
Activated Clotting Time: 157 seconds

## 2015-04-25 LAB — URINE MICROSCOPIC-ADD ON

## 2015-04-25 LAB — MAGNESIUM: MAGNESIUM: 2.8 mg/dL — AB (ref 1.7–2.4)

## 2015-04-25 LAB — TRIGLYCERIDES: Triglycerides: 13 mg/dL (ref <150)

## 2015-04-25 LAB — BRAIN NATRIURETIC PEPTIDE: B Natriuretic Peptide: 612.8 pg/mL — ABNORMAL HIGH (ref 0.0–100.0)

## 2015-04-25 LAB — T4, FREE: Free T4: 1.17 ng/dL — ABNORMAL HIGH (ref 0.61–1.12)

## 2015-04-25 LAB — MRSA PCR SCREENING: MRSA by PCR: NEGATIVE

## 2015-04-25 LAB — PHOSPHORUS: Phosphorus: 6.4 mg/dL — ABNORMAL HIGH (ref 2.5–4.6)

## 2015-04-25 LAB — TROPONIN I: Troponin I: 0.1 ng/mL — ABNORMAL HIGH (ref <0.031)

## 2015-04-25 LAB — LACTIC ACID, PLASMA: Lactic Acid, Venous: 2.7 mmol/L (ref 0.5–2.0)

## 2015-04-25 SURGERY — RIGHT HEART CATH

## 2015-04-25 MED ORDER — HEPARIN SODIUM (PORCINE) 1000 UNIT/ML IJ SOLN
INTRAMUSCULAR | Status: DC | PRN
  Administered 2015-04-25: 3000 [IU] via INTRAVENOUS
  Administered 2015-04-25: 6000 [IU] via INTRAVENOUS

## 2015-04-25 MED ORDER — AMIODARONE HCL IN DEXTROSE 360-4.14 MG/200ML-% IV SOLN
60.0000 mg/h | INTRAVENOUS | Status: AC
  Administered 2015-04-25: 60 mg/h via INTRAVENOUS

## 2015-04-25 MED ORDER — HEPARIN (PORCINE) IN NACL 2-0.9 UNIT/ML-% IJ SOLN
INTRAMUSCULAR | Status: AC
  Filled 2015-04-25: qty 500

## 2015-04-25 MED ORDER — METOPROLOL TARTRATE 1 MG/ML IV SOLN: 2.5000 mg | INTRAVENOUS | Status: DC | PRN

## 2015-04-25 MED ORDER — DOBUTAMINE IN D5W 4-5 MG/ML-% IV SOLN: 1.0000 ug/kg/min | INTRAVENOUS | Status: DC

## 2015-04-25 MED ORDER — SODIUM CHLORIDE 0.9 % IV SOLN: INTRAVENOUS | Status: DC

## 2015-04-25 MED ORDER — SODIUM CHLORIDE 0.9% FLUSH: 3.0000 mL | Freq: Two times a day (BID) | INTRAVENOUS | Status: DC

## 2015-04-25 MED ORDER — SODIUM CHLORIDE 0.9% FLUSH
3.0000 mL | Freq: Two times a day (BID) | INTRAVENOUS | Status: DC
  Administered 2015-04-29 – 2015-05-02 (×2): 3 mL via INTRAVENOUS

## 2015-04-25 MED ORDER — MIDAZOLAM HCL 2 MG/2ML IJ SOLN
INTRAMUSCULAR | Status: AC
  Filled 2015-04-25: qty 2

## 2015-04-25 MED ORDER — ONDANSETRON HCL 4 MG/2ML IJ SOLN: 4.0000 mg | Freq: Four times a day (QID) | INTRAMUSCULAR | Status: DC | PRN

## 2015-04-25 MED ORDER — LIDOCAINE HCL (PF) 1 % IJ SOLN
INTRAMUSCULAR | Status: DC | PRN
  Administered 2015-04-25: 5 mL
  Administered 2015-04-25 (×2): 20 mL

## 2015-04-25 MED ORDER — DOBUTAMINE IN D5W 4-5 MG/ML-% IV SOLN
2.5000 ug/kg/min | INTRAVENOUS | Status: DC
  Administered 2015-04-25: 2.5 ug/kg/min via INTRAVENOUS
  Filled 2015-04-25: qty 250

## 2015-04-25 MED ORDER — LIDOCAINE HCL (PF) 1 % IJ SOLN
INTRAMUSCULAR | Status: AC
  Filled 2015-04-25: qty 30

## 2015-04-25 MED ORDER — PANTOPRAZOLE SODIUM 40 MG IV SOLR
40.0000 mg | INTRAVENOUS | Status: DC
  Administered 2015-04-25 – 2015-04-29 (×5): 40 mg via INTRAVENOUS
  Filled 2015-04-25 (×5): qty 40

## 2015-04-25 MED ORDER — FENTANYL CITRATE (PF) 100 MCG/2ML IJ SOLN
INTRAMUSCULAR | Status: DC | PRN
  Administered 2015-04-25: 25 ug via INTRAVENOUS

## 2015-04-25 MED ORDER — SODIUM CHLORIDE 0.9 % IV SOLN
25.0000 ug/h | INTRAVENOUS | Status: DC
  Administered 2015-04-25: 25 ug/h via INTRAVENOUS
  Administered 2015-04-26: 150 ug/h via INTRAVENOUS
  Filled 2015-04-25 (×4): qty 50

## 2015-04-25 MED ORDER — MIDAZOLAM HCL 2 MG/2ML IJ SOLN
1.0000 mg | INTRAMUSCULAR | Status: DC | PRN
Start: 2015-04-25 — End: 2015-04-28
  Administered 2015-04-25: 2 mg via INTRAVENOUS
  Filled 2015-04-25: qty 2

## 2015-04-25 MED ORDER — IOPAMIDOL (ISOVUE-370) INJECTION 76%
INTRAVENOUS | Status: DC | PRN
  Administered 2015-04-25: 25 mL via INTRA_ARTERIAL

## 2015-04-25 MED ORDER — DEXTROSE 5 % IV SOLN
0.0000 ug/min | INTRAVENOUS | Status: DC
  Administered 2015-04-25: 20 ug/min via INTRAVENOUS
  Administered 2015-04-25: 10 ug/min via INTRAVENOUS
  Administered 2015-04-26: 25 ug/min via INTRAVENOUS
  Administered 2015-04-26: 50 ug/min via INTRAVENOUS
  Administered 2015-04-27: 44 ug/min via INTRAVENOUS
  Administered 2015-04-27: 42 ug/min via INTRAVENOUS
  Administered 2015-04-27: 46 ug/min via INTRAVENOUS
  Administered 2015-04-28 (×2): 45 ug/min via INTRAVENOUS
  Administered 2015-04-28: 38 ug/min via INTRAVENOUS
  Administered 2015-04-29: 36 ug/min via INTRAVENOUS
  Administered 2015-04-29: 28 ug/min via INTRAVENOUS
  Administered 2015-04-29: 27 ug/min via INTRAVENOUS
  Administered 2015-04-30 – 2015-05-01 (×2): 20 ug/min via INTRAVENOUS
  Administered 2015-05-01: 14 ug/min via INTRAVENOUS
  Administered 2015-05-03: 18 ug/min via INTRAVENOUS
  Administered 2015-05-03: 13 ug/min via INTRAVENOUS
  Administered 2015-05-04: 16 ug/min via INTRAVENOUS
  Administered 2015-05-05: 11 ug/min via INTRAVENOUS
  Administered 2015-05-06: 9 ug/min via INTRAVENOUS
  Filled 2015-04-25 (×27): qty 16

## 2015-04-25 MED ORDER — SODIUM CHLORIDE 0.9 % IV SOLN: 250.0000 mL | INTRAVENOUS | Status: DC | PRN

## 2015-04-25 MED ORDER — HEPARIN (PORCINE) IN NACL 100-0.45 UNIT/ML-% IJ SOLN
1600.0000 [IU]/h | INTRAMUSCULAR | Status: DC
  Administered 2015-04-25: 1600 [IU]/h via INTRAVENOUS
  Filled 2015-04-25: qty 250

## 2015-04-25 MED ORDER — PHENYLEPHRINE HCL 10 MG/ML IJ SOLN
30.0000 ug/min | INTRAMUSCULAR | Status: DC
  Administered 2015-04-25: 30 ug/min via INTRAVENOUS
  Filled 2015-04-25: qty 1

## 2015-04-25 MED ORDER — FUROSEMIDE 10 MG/ML IJ SOLN
80.0000 mg | Freq: Three times a day (TID) | INTRAMUSCULAR | Status: DC
  Administered 2015-04-25: 80 mg via INTRAVENOUS
  Filled 2015-04-25 (×2): qty 8

## 2015-04-25 MED ORDER — AMIODARONE HCL IN DEXTROSE 360-4.14 MG/200ML-% IV SOLN
30.0000 mg/h | INTRAVENOUS | Status: DC
  Administered 2015-04-26 – 2015-05-03 (×16): 30 mg/h via INTRAVENOUS
  Filled 2015-04-25 (×19): qty 200

## 2015-04-25 MED ORDER — MILRINONE IN DEXTROSE 20 MG/100ML IV SOLN
0.2500 ug/kg/min | INTRAVENOUS | Status: DC
  Administered 2015-04-25: 0.25 ug/kg/min via INTRAVENOUS
  Administered 2015-04-27 – 2015-04-29 (×3): 0.125 ug/kg/min via INTRAVENOUS
  Administered 2015-04-30 – 2015-05-04 (×8): 0.25 ug/kg/min via INTRAVENOUS
  Filled 2015-04-25 (×14): qty 100

## 2015-04-25 MED ORDER — SODIUM CHLORIDE 0.9% FLUSH
3.0000 mL | Freq: Two times a day (BID) | INTRAVENOUS | Status: DC
  Administered 2015-04-25 – 2015-04-27 (×3): 3 mL via INTRAVENOUS

## 2015-04-25 MED ORDER — SODIUM CHLORIDE 0.9 % IV SOLN
250.0000 mL | INTRAVENOUS | Status: DC | PRN
  Administered 2015-04-25: 10 mL via INTRAVENOUS
  Administered 2015-05-03: 250 mL via INTRAVENOUS

## 2015-04-25 MED ORDER — FUROSEMIDE 10 MG/ML IJ SOLN
40.0000 mg | Freq: Two times a day (BID) | INTRAMUSCULAR | Status: DC
  Administered 2015-04-25: 40 mg via INTRAVENOUS
  Filled 2015-04-25: qty 4

## 2015-04-25 MED ORDER — FENTANYL CITRATE (PF) 100 MCG/2ML IJ SOLN
INTRAMUSCULAR | Status: AC
  Administered 2015-04-25: 200 ug
  Filled 2015-04-25: qty 4

## 2015-04-25 MED ORDER — HEPARIN SODIUM (PORCINE) 1000 UNIT/ML IJ SOLN
INTRAMUSCULAR | Status: AC
  Filled 2015-04-25: qty 1

## 2015-04-25 MED ORDER — AMIODARONE LOAD VIA INFUSION
150.0000 mg | Freq: Once | INTRAVENOUS | Status: AC
  Administered 2015-04-25: 150 mg via INTRAVENOUS
  Filled 2015-04-25: qty 83.34

## 2015-04-25 MED ORDER — LEVOTHYROXINE SODIUM 100 MCG IV SOLR
12.5000 ug | Freq: Every day | INTRAVENOUS | Status: DC
  Filled 2015-04-25: qty 5

## 2015-04-25 MED ORDER — PROPOFOL 1000 MG/100ML IV EMUL
0.0000 ug/kg/min | INTRAVENOUS | Status: DC
  Administered 2015-04-25: 10 ug/kg/min via INTRAVENOUS
  Administered 2015-04-26: 8 ug/kg/min via INTRAVENOUS
  Administered 2015-04-27 – 2015-04-28 (×2): 5 ug/kg/min via INTRAVENOUS
  Filled 2015-04-25 (×3): qty 100

## 2015-04-25 MED ORDER — HEPARIN SODIUM (PORCINE) 5000 UNIT/ML IJ SOLN: 5000.0000 [IU] | Freq: Three times a day (TID) | INTRAMUSCULAR | Status: DC

## 2015-04-25 MED ORDER — ACETAMINOPHEN 325 MG PO TABS: 650.0000 mg | ORAL_TABLET | ORAL | Status: DC | PRN

## 2015-04-25 MED ORDER — MIDAZOLAM HCL 2 MG/2ML IJ SOLN
INTRAMUSCULAR | Status: DC | PRN
  Administered 2015-04-25: 4 mg
  Administered 2015-04-25: 1 mg via INTRAVENOUS

## 2015-04-25 MED ORDER — HEPARIN (PORCINE) IN NACL 100-0.45 UNIT/ML-% IJ SOLN
200.0000 [IU]/h | INTRAMUSCULAR | Status: DC
  Administered 2015-04-25: 100 [IU]/h via INTRAVENOUS
  Administered 2015-04-26: 1700 [IU]/h via INTRAVENOUS
  Administered 2015-04-27: 1600 [IU]/h via INTRAVENOUS
  Administered 2015-04-28: 1000 [IU]/h via INTRAVENOUS
  Administered 2015-04-29: 700 [IU]/h via INTRAVENOUS
  Administered 2015-05-01: 900 [IU]/h via INTRAVENOUS
  Administered 2015-05-02: 800 [IU]/h via INTRAVENOUS
  Filled 2015-04-25 (×7): qty 250

## 2015-04-25 MED ORDER — SODIUM CHLORIDE 0.9 % IV SOLN
INTRAVENOUS | Status: DC
  Administered 2015-04-25: 08:00:00 via INTRAVENOUS

## 2015-04-25 MED ORDER — DOBUTAMINE IN D5W 4-5 MG/ML-% IV SOLN
INTRAVENOUS | Status: AC
  Administered 2015-04-25: 2.5 ug/kg/min via INTRAVENOUS
  Filled 2015-04-25: qty 250

## 2015-04-25 MED ORDER — SODIUM CHLORIDE 0.9% FLUSH: 3.0000 mL | INTRAVENOUS | Status: DC | PRN

## 2015-04-25 MED ORDER — FENTANYL CITRATE (PF) 100 MCG/2ML IJ SOLN
INTRAMUSCULAR | Status: AC
  Filled 2015-04-25: qty 2

## 2015-04-25 MED ORDER — PROPOFOL 1000 MG/100ML IV EMUL
INTRAVENOUS | Status: AC
  Filled 2015-04-25: qty 100

## 2015-04-25 MED ORDER — MIDAZOLAM HCL 2 MG/2ML IJ SOLN
INTRAMUSCULAR | Status: AC
  Administered 2015-04-25: 4 mg
  Filled 2015-04-25: qty 4

## 2015-04-25 MED ORDER — HEPARIN SODIUM (PORCINE) 5000 UNIT/ML IJ SOLN
50000.0000 [IU] | INTRAVENOUS | Status: DC
  Administered 2015-04-25 – 2015-04-30 (×2): 50000 [IU]
  Filled 2015-04-25 (×5): qty 10

## 2015-04-25 MED ORDER — INSULIN ASPART 100 UNIT/ML ~~LOC~~ SOLN
0.0000 [IU] | SUBCUTANEOUS | Status: DC
  Administered 2015-04-25: 2 [IU] via SUBCUTANEOUS
  Administered 2015-04-25: 3 [IU] via SUBCUTANEOUS
  Administered 2015-04-26 – 2015-04-28 (×15): 2 [IU] via SUBCUTANEOUS
  Administered 2015-04-29 (×2): 3 [IU] via SUBCUTANEOUS
  Administered 2015-04-29: 2 [IU] via SUBCUTANEOUS
  Administered 2015-04-29: 5 [IU] via SUBCUTANEOUS
  Administered 2015-04-29: 3 [IU] via SUBCUTANEOUS
  Administered 2015-04-30 (×2): 2 [IU] via SUBCUTANEOUS
  Administered 2015-04-30: 3 [IU] via SUBCUTANEOUS
  Administered 2015-04-30 (×2): 2 [IU] via SUBCUTANEOUS
  Administered 2015-05-01: 3 [IU] via SUBCUTANEOUS
  Administered 2015-05-01: 2 [IU] via SUBCUTANEOUS
  Administered 2015-05-01 – 2015-05-02 (×5): 3 [IU] via SUBCUTANEOUS
  Administered 2015-05-02 – 2015-05-03 (×6): 2 [IU] via SUBCUTANEOUS
  Administered 2015-05-03: 3 [IU] via SUBCUTANEOUS
  Administered 2015-05-03: 2 [IU] via SUBCUTANEOUS
  Administered 2015-05-03 (×2): 3 [IU] via SUBCUTANEOUS
  Administered 2015-05-04: 2 [IU] via SUBCUTANEOUS
  Administered 2015-05-04: 5 [IU] via SUBCUTANEOUS
  Administered 2015-05-05: 2 [IU] via SUBCUTANEOUS
  Administered 2015-05-05: 3 [IU] via SUBCUTANEOUS
  Administered 2015-05-05: 2 [IU] via SUBCUTANEOUS
  Administered 2015-05-05: 3 [IU] via SUBCUTANEOUS
  Administered 2015-05-06: 5 [IU] via SUBCUTANEOUS
  Administered 2015-05-06 – 2015-05-07 (×2): 3 [IU] via SUBCUTANEOUS
  Administered 2015-05-07: 2 [IU] via SUBCUTANEOUS
  Filled 2015-04-25: qty 1

## 2015-04-25 SURGICAL SUPPLY — 15 items
CATH CROSS OVER TEMPO 5F (CATHETERS) ×3 IMPLANT
CATH INFINITI 5FR ANG PIGTAIL (CATHETERS) ×3 IMPLANT
CATH STRAIGHT 5FR 65CM (CATHETERS) ×6 IMPLANT
CATH SWAN GANZ VIP 7.5F (CATHETERS) ×3 IMPLANT
KIT HEART LEFT (KITS) ×3 IMPLANT
KIT HEART RIGHT NAMIC (KITS) ×3 IMPLANT
PACK CARDIAC CATHETERIZATION (CUSTOM PROCEDURE TRAY) ×3 IMPLANT
SET IMPELLA CP PUMP (CATHETERS) ×3 IMPLANT
SHEATH PINNACLE 5F 10CM (SHEATH) ×3 IMPLANT
SHEATH PINNACLE 8F 10CM (SHEATH) ×3 IMPLANT
SLEEVE REPOSITIONING LENGTH 30 (MISCELLANEOUS) ×3 IMPLANT
TRANSDUCER W/STOPCOCK (MISCELLANEOUS) ×6 IMPLANT
WIRE EMERALD 3MM-J .025X260CM (WIRE) ×6 IMPLANT
WIRE EMERALD 3MM-J .035X150CM (WIRE) ×3 IMPLANT
WIRE HI TORQ STEELCORE18 300CM (WIRE) ×3 IMPLANT

## 2015-04-25 NOTE — Progress Notes (Signed)
Advanced Heart Failure Rounding Note  PCP: None Primary Cardiologist: None  Reason for consult: New onset/Low output HF.   Subjective:    Pt has no known cardiac history, it has been > 40 years since he has seen an MD so unclear medical history.   Was seen by a PCP earlier this week after he was urged by his family and found to have Afib so started on eliquis and BB. Troponin came back mildly elevated on his labs so was told to come to ED.  Pertinent admission labs include K 4.6, creatinine 1.73, BNP 512.8, Troponin 0.08, Lactic acid 4.53, Pro-calcitonin 0.16, WBC 12.0, Hgb 18.7.  In ED he had worsening mentation. Central line was placed and seen by CCM. Started on Neo for hypotension. Bedside echo demonstrates EF of 10%.   Currently on dobutamine 2.5. Weaning neo.    States he has been feeling more SOB for at least 2 months.  He has progressed to SOB at rest that worsens with minimal activity.   Approx 2 months ago he also had an episode of tachypalpitations and Left sided weakness that spontaneously resolved without medical treatment. His feet have been dusky and cold to the touch for "some time". He is + for orthopnea and bendopnea. Per his cousin, his only remaining family member, he has been more confused over the past 6-8 weeks as well. He has a distant history of smoking (used a pipe in college), and actively drinks 2-3 beers per night x the past several years. Denies history of surgery, blood transfusions, IV drug use.  States his Maternal Aunt had an MI when she was 36 and his Father died from ALS complications at 53 (became symptomatic at age 33). He denies any other family history, although outpatient note states that his mother had a stroke.     Objective:   Weight Range: 222 lb 7.1 oz (100.9 kg) Body mass index is 27.09 kg/(m^2).   Vital Signs:   Temp:  [93.7 F (34.3 C)-99.3 F (37.4 C)] 98.2 F (36.8 C) (03/29 0745) Pulse Rate:  [80-101] 80 (03/29 0915) Resp:   [13-33] 24 (03/29 0915) BP: (86-129)/(54-99) 108/87 mmHg (03/29 0915) SpO2:  [95 %-100 %] 99 % (03/29 0915) Weight:  [222 lb 7.1 oz (100.9 kg)] 222 lb 7.1 oz (100.9 kg) (03/29 1000)    Weight change: Filed Weights   10-May-2015 1000  Weight: 222 lb 7.1 oz (100.9 kg)    Intake/Output:   Intake/Output Summary (Last 24 hours) at May 10, 2015 1050 Last data filed at 10-May-2015 0641  Gross per 24 hour  Intake      0 ml  Output     50 ml  Net    -50 ml     Physical Exam: General:  Appears older than state age and chronically ill HEENT: Left eyelid droop Neck: supple. JVP to ear. Carotids 2+ bilat; no bruits. No lymphadenopathy or thyromegaly appreciated. Cor: PMI nondisplaced. Irregularly irregular, distant heart sounds, No murmur appreciated, ? S3. Lungs: Basilar crackles Abdomen: soft, nontender, mild distention. No hepatosplenomegaly. No bruits or masses. +BS Extremities: Dusky and cold to touch to mid LLE. 2-3+ edema into thighs. Unable to palpate pedal pulses. Neuro: alert & orientedx3, cranial nerves grossly intact. moves all 4 extremities w/o difficulty. Affect flat  Telemetry: Reviewed, Afib 80-90s  Labs: CBC  Recent Labs  04/02/2015 1630 03/28/2015 2245  WBC 10.9* 12.0*  NEUTROABS 8.8*  --   HGB 17.4* 18.7*  HCT 49.8 52.6*  MCV 93.6 92.8  PLT 268 260   Basic Metabolic Panel  Recent Labs  August 09, 2015 1630 August 09, 2015 2245  NA 136 134*  K 4.6 5.1  CL 101 101  CO2 20 16*  GLUCOSE 146* 229*  BUN 34* 42*  CREATININE 1.73* 2.21*  CALCIUM 9.5 9.2   Liver Function Tests  Recent Labs  August 09, 2015 1630 August 09, 2015 2338  AST 44* 189*  ALT 84* 151*  ALKPHOS 130* 186*  BILITOT 2.1* 3.9*  PROT 7.2 7.9  ALBUMIN 3.9 4.0   No results for input(s): LIPASE, AMYLASE in the last 72 hours. Cardiac Enzymes  Recent Labs  August 09, 2015 1630 04/02/2015 0550  CKTOTAL 372*  --   CKMB 11.2*  --   TROPONINI 0.06* 0.10*    BNP: BNP (last 3 results)  Recent Labs  August 09, 2015 2338  BNP  612.8*    ProBNP (last 3 results) No results for input(s): PROBNP in the last 8760 hours.   D-Dimer No results for input(s): DDIMER in the last 72 hours. Hemoglobin A1C No results for input(s): HGBA1C in the last 72 hours. Fasting Lipid Panel No results for input(s): CHOL, HDL, LDLCALC, TRIG, CHOLHDL, LDLDIRECT in the last 72 hours. Thyroid Function Tests  Recent Labs  August 09, 2015 1630  TSH 6.00*    Other results:     Imaging/Studies:  Dg Chest 2 View  05/04/2015  CLINICAL DATA:  Shortness of breath, decreased oxygen saturation, atrial fibrillation. Former smoker. EXAM: CHEST  2 VIEW COMPARISON:  None in PACs FINDINGS: The lungs are adequately inflated. There small bilateral pleural effusions. The pulmonary interstitial markings are increased. The heart is top-normal in size. The central pulmonary vascularity is prominent. There is mild loss of height of the body of approximately T8. IMPRESSION: Small bilateral pleural effusions with mild interstitial prominence suggests low-grade CHF. Chronic interstitial prominence could also be secondary to the patient's smoking history or to early interstitial pneumonia. Without previous studies it is difficult to be more precise. Electronically Signed   By: David  SwazilandJordan M.D.   On: 004/07/2015 16:56   Dg Chest Portable 1 View  04/04/2015  CLINICAL DATA:  Central line placement.  Initial encounter. EXAM: PORTABLE CHEST 1 VIEW COMPARISON:  Chest radiograph 03/27/2015 FINDINGS: The patient's right IJ line is seen ending about the distal SVC. A small right pleural effusion is noted, with associated right basilar airspace opacity likely reflecting atelectasis. There is mild elevation of the left hemidiaphragm. No pneumothorax is seen. The cardiomediastinal silhouette is borderline enlarged. No acute osseous abnormalities are identified. IMPRESSION: 1. Right IJ line noted ending about the distal SVC. 2. Small right pleural effusion, with associated right  basilar airspace opacity likely reflecting atelectasis. Mild elevation of the left hemidiaphragm. 3. Borderline cardiomegaly. Electronically Signed   By: Roanna RaiderJeffery  Chang M.D.   On: 04/04/2015 05:51   Dg Chest Port 1 View  05/04/2015  CLINICAL DATA:  Acute onset of hypotension and hypothermia. Atrial fibrillation. Initial encounter. EXAM: PORTABLE CHEST 1 VIEW COMPARISON:  Chest radiograph performed earlier today at 4:58 p.m. FINDINGS: The lungs are well-aerated. Mild bibasilar opacities likely reflect atelectasis. There is no evidence of pleural effusion or pneumothorax. The cardiomediastinal silhouette is borderline normal in size. No acute osseous abnormalities are seen. IMPRESSION: Mild bibasilar airspace opacities likely reflect atelectasis. Lungs otherwise clear. Electronically Signed   By: Roanna RaiderJeffery  Chang M.D.   On: 004/07/2015 23:51    Latest Echo  Latest Cath   Medications:     Scheduled Medications: . insulin  aspart  0-15 Units Subcutaneous 6 times per day    Infusions: . sodium chloride Stopped (2015/05/17 1041)  . DOBUTamine 2.5 mcg/kg/min (05/17/15 1045)  . heparin 1,600 Units/hr (05-17-15 1610)  . phenylephrine (NEO-SYNEPHRINE) Adult infusion 10 mcg/min (05-17-2015 1020)    PRN Medications: sodium chloride   Assessment/Plan   Hensley Treat is a 65 y.o. male sent to [facility] after PCP labs came back abnormal with mildly elevated troponin.  Was found to be in florid HF and transferred to [facility] for further work up. He has no known medical history, as he has not seen an MD in > 40 years.  1. Acute systolic HF - Bedside Echo EF ~96%. - He is tenuous on dobutamine 2.5 mcg/kg/min. Will need diuresis, will decide on after RHC.  - Stop heparin. For urgent RHC NOW. May need mechanical support - Will need formal echo and LHC eventually.  2. Afib - Unclear chronicity, may have contributed to his CM - Will eventually need long-term anticoagulation. Hold for now with upcoming procedure.  -  This patients CHA2DS2-VASc Score is likely at least 3 but unclear with unknown history.  He has at least CHF and with ?TIA would be at least 3.  Suspect PAD (+1) with poor flow to feet.  3. ?TIA - From patient history sounds like he may have had a TIA several months ago that affected his left side with no lasting deficits.  4. AKI - Will need to follow closely with diuresis. Unclear baseline with no follow-up.  5. Elevated Blood Sugar - Elevated on BMET. Have placed order for HgbA1C in am.  6. UTI - Proteinuria with many bacteria. UCx will be sent. 7. Elevated TSH - Mild tsh elevation with slightly elevated free T4. Will need follow up.   Length of Stay: 0  Graciella Freer PA-C 2015-05-17, 10:50 AM  Advanced Heart Failure Team Pager [phone] (M-F; 7a - 4p)  Please contact [facility] for night-coverage after hours (4p -7a ) and weekends on [email or web address]  Patient seen with PA, agree with the above note.  1. Acute systolic CHF/cardiogenic shock: Steadily worsening dyspnea over the last month, no clear trigger.  Markedly worse yesterday, came to ER.  EF 10% on bedside echo.  Noted to be in atrial fibrillation and hypotensive, started initially on phenylephrine but has been transitioned over to dobutamine.  HR steady in 90s currently.  No chest pain, slight troponin elevation is likely explained by cardiogenic shock/volume overload.  Ddx for cardiomyopathy = coronary disease (?chronic) versus myocarditis versus tachy-mediated with atrial fibrillation of uncertain duration.  - [facility].  He is currently on dobutamine 2.5, will decide on Impella placement based on numbers.  Will leave swan in place.  - Will need diuresis, will decide dosing based on RHC.  - Will need eventual coronary angiography.  Not ACS so not urgent/emergent. Would like improved creatinine prior to giving contrast.  2. AKI: Suspect cardiorenal/related to low output HF and  hypotension.  Awaiting repeat BMET.  3. Atrial fibrillation: Of uncertain duration.  Noted at outpatient appointment earlier this week.  Currently controlled HR despite dobutamine. Of note, possible TIA several months ago.  Will need anticoagulation, likely start with heparin after cath.  4. Endo: Elevated TSH and mildly elevated free T4.  ?Sick euthyroid. Will follow.   45 minutes critical care time.   Marca Ancona 05/17/2015 11:41 AM

## 2015-04-25 NOTE — Progress Notes (Signed)
  Echocardiogram Echocardiogram Transesophageal has been performed.  Delcie RochENNINGTON, Federico Maiorino 04/02/2015, 6:21 PM

## 2015-04-25 NOTE — Progress Notes (Signed)
Echocardiogram 2D Echocardiogram has been performed.  Nolon RodBrown, Tony 04/15/2015, 2:31 PM

## 2015-04-25 NOTE — ED Notes (Signed)
[facility] here to pick up patient at this time, CBG 149, on-coming nurse aware that I did not give insulin for the CBG of 149

## 2015-04-25 NOTE — Progress Notes (Signed)
Chaplain called to sit with cousin of patient in waiting room.  Cherly HensenCousins are last of family..  very close.  She is very concerned about patient.  Prayer and spiritual conversation.  Presence.  Rev. Audie BoxJan Hill [phone]

## 2015-04-25 NOTE — ED Notes (Signed)
Writer notified EDP Pollina of abnormal I-stat lactic lab result.

## 2015-04-25 NOTE — Progress Notes (Signed)
Tube advance 2cm per MD order. Tube is 25cm lip. RN aware and at bedside

## 2015-04-25 NOTE — Progress Notes (Signed)
Called back by bedside RN, patient is decompensating from a respiratory standpoint.  I had a discussion with the patient.  After discussion, he is amenable to intubation short term only and does not wish for CPR but cardioversion is ok.  He is clearly in hypoxemic respiratory failure.  Will begin levo prior to intubation as I anticipate BP will deteriorate once intubated.  Discussed with Dr. Clarise CruzBenshimon, will order another echo, change dobutamine to milrinone, CXR and ABG post intubation and sedation orders entered.  The patient is critically ill with multiple organ systems failure and requires high complexity decision making for assessment and support, frequent evaluation and titration of therapies, application of advanced monitoring technologies and extensive interpretation of multiple databases.   Critical Care Time devoted to patient care services described in this note is  45  Minutes. This time reflects time of care of this signee Dr Koren BoundWesam Sadye Kiernan. This critical care time does not reflect procedure time, or teaching time or supervisory time of PA/NP/Med student/Med Resident etc but could involve care discussion time.  Alyson ReedyWesam G. Ziquan Fidel, M.D. [facility]. Pager: [phone]. After hours pager: [phone].

## 2015-04-25 NOTE — ED Notes (Signed)
First attempt made to call report, nurse unavailable.

## 2015-04-25 NOTE — Progress Notes (Signed)
Progressive agitation.  Will add diprivan.  Coralyn HellingVineet Danie Hannig, MD [facility] 2015/03/03, 8:45 PM Pager:  [phone] After 3pm call: [phone]

## 2015-04-25 NOTE — ED Notes (Signed)
Writer notified EDP of abnormal I-stat lactic result 

## 2015-04-25 NOTE — Progress Notes (Signed)
Orthopedic Tech Progress Note Patient Details:  Wesley DuffelJoseph Tyler 03/20/1950 [id number]  Ortho Devices Type of Ortho Device: Knee Immobilizer Ortho Device/Splint Location: delivered knii immobilizer to rn Ortho Device/Splint Interventions: Rich BraveOrdered   Aayat Hajjar J 04/24/2015, 7:53 PM

## 2015-04-25 NOTE — CV Procedure (Signed)
    TRANSESOPHAGEAL ECHOCARDIOGRAM   NAME:  Pamala DuffelJoseph Stonesifer   MRN: [id number] DOB:  08/19/1950   ADMIT DATE: January 22, 2016  INDICATIONS: Abnormal chest wall echo    PROCEDURE:   TEE done emergently due to shock. Emergent consent implied.   COMPLICATIONS:    There were no immediate complications.  FINDINGS:  LEFT VENTRICLE: EF = 5-10%. Diffuse severe HK. Impella catheter well positioned  RIGHT VENTRICLE: Dilated severe HK  LEFT ATRIUM: Dilated. No clot  LEFT ATRIAL APPENDAGE: No thrombus.   RIGHT ATRIUM: Dilated.   AORTIC VALVE:  Trileaflet. Moderate AI due to Impella  MITRAL VALVE:    Normal. Moderate MR  TRICUSPID VALVE: Normal. Severe TR  PULMONIC VALVE: Grossly normal.  INTERATRIAL SEPTUM: No PFO or ASD. Bows right to left  PERICARDIUM: No effusion  DESCENDING AORTA: Mild plaque   Multiple episodes of spontaneous bubbles on the left side but we could not reproduce it reliably with injection of saline through any of the IV ports. No ASD or VSD identified. Suspect peripheral shunt process.  Bensimhon, Daniel,MD 6:28 PM

## 2015-04-25 NOTE — H&P (Signed)
PULMONARY / CRITICAL CARE MEDICINE   Name: Aubra Pappalardo MRN: [id number] DOB: 06/28/1950    ADMISSION DATE:  05/04/15 CONSULTATION DATE:  04/03/2015  REFERRING MD:  EDP  CHIEF COMPLAINT:  SOB  HISTORY OF PRESENT ILLNESS:  Daylin Gruszka is a 65 y.o. male with no known PMH.  He had not seen a health care provider since his 7's and on 05-04-15, his cousin made him go to an urgent care center to establish care and for further evaluation of occasional palpitations that pt had been experiencing on and off.  Palpitations had apparently worsened over the past 6 months and for the past 2 weeks, his heart had been continually beating "fast and hard".  He had not had any exacerbating or alleviating factors.  He has had associated severe SOB that is worse with exertion, increase in LE edema, PND and orthopnea.  Per cousin, pt sleeps in a chair sitting upright.   He has also had mild intermittent cough that was non productive.  Denies any fevers/chills/sweats, headaches, lightheadedness, chest pain, N/V/D, abd pain, myalgias.  At urgent care he was found to be in A.fib with RVR and was treated with lopressor and eliquis.  Lab work showed elevated BNP and troponin so pt was instructed to come to ED for further evaluation.  In ED, he had AKI with SCr 1.79 > 2.21 (unknown baseline), lactic acidosis (lactate 4.3), transaminitis, TSH 6.  PCCM was called for admission.  PAST MEDICAL HISTORY :  He  has no past medical history on file.  PAST SURGICAL HISTORY: He  has no past surgical history on file.  No Known Allergies  No current facility-administered medications on file prior to encounter.   Current Outpatient Prescriptions on File Prior to Encounter  Medication Sig  . apixaban (ELIQUIS) 5 MG TABS tablet Take 1 tablet (5 mg total) by mouth 2 (two) times daily.  . metoprolol (LOPRESSOR) 50 MG tablet Take 1 tablet (50 mg total) by mouth 2 (two) times daily.    FAMILY HISTORY:  His has no family  status information on file.   SOCIAL HISTORY: He  reports that he has quit smoking. He does not have any smokeless tobacco history on file. He reports that he drinks about 4.2 - 8.4 oz of alcohol per week. He reports that he does not use illicit drugs.  REVIEW OF SYSTEMS:   All negative; except for those that are bolded, which indicate positives.  Constitutional: weight loss, weight gain, night sweats, fevers, chills, fatigue, weakness.  HEENT: headaches, sore throat, sneezing, nasal congestion, post nasal drip, difficulty swallowing, tooth/dental problems, visual complaints, visual changes, ear aches. Neuro: difficulty with speech, weakness, numbness, ataxia. CV:  chest pain, orthopnea, PND, swelling in lower extremities, dizziness, palpitations, syncope.  Resp: dry cough, hemoptysis, dyspnea, wheezing. GI  heartburn, indigestion, abdominal pain, nausea, vomiting, diarrhea, constipation, change in bowel habits, loss of appetite, hematemesis, melena, hematochezia.  GU: dysuria, change in color of urine, urgency or frequency, flank pain, hematuria. MSK: joint pain or swelling, decreased range of motion. Psych: change in mood or affect, depression, anxiety, suicidal ideations, homicidal ideations. Skin: rash, itching, bruising.   SUBJECTIVE:  Denies chest pain.  SOB improved, heart rate improved.  VITAL SIGNS: BP 102/73 mmHg  Pulse 95  Temp(Src) 93.7 F (34.3 C) (Core (Comment))  Resp 23  SpO2 95%  HEMODYNAMICS:    VENTILATOR SETTINGS:    INTAKE / OUTPUT:    PHYSICAL EXAMINATION: General: Caucasian male, in NAD. Neuro: A&O  x 3, non-focal.  HEENT: [place]/AT. PERRL, sclerae anicteric. Cardiovascular: IRIR, no M/R/G.  Lungs: Respirations even and unlabored.  CTA bilaterally, No W/R/R. Abdomen: BS x 4, soft, NT/ND.  Musculoskeletal: No gross deformities, 3+ edema to knees. Skin: Intact, cool, no rashes.  LABS:  BMET  Recent Labs Lab 05/05/15 1630 05/05/15 2245  NA 136  134*  K 4.6 5.1  CL 101 101  CO2 20 16*  BUN 34* 42*  CREATININE 1.73* 2.21*  GLUCOSE 146* 229*    Electrolytes  Recent Labs Lab 05-05-2015 1630 05-05-2015 2245  CALCIUM 9.5 9.2    CBC  Recent Labs Lab 05-May-2015 1630 05-May-2015 2245  WBC 10.9* 12.0*  HGB 17.4* 18.7*  HCT 49.8 52.6*  PLT 268 260    Coag's No results for input(s): APTT, INR in the last 168 hours.  Sepsis Markers  Recent Labs Lab 04/12/2015 0146 04/17/2015 0357  LATICACIDVEN 4.32* 4.53*    ABG  Recent Labs Lab 04/19/2015 0146  PHART 7.459*  PCO2ART 17.1*  PO2ART 457*    Liver Enzymes  Recent Labs Lab May 05, 2015 1630 05-05-2015 2338  AST 44* 189*  ALT 84* 151*  ALKPHOS 130* 186*  BILITOT 2.1* 3.9*  ALBUMIN 3.9 4.0    Cardiac Enzymes  Recent Labs Lab 05/05/15 1630  TROPONINI 0.06*    Glucose No results for input(s): GLUCAP in the last 168 hours.  Imaging Dg Chest 2 View  05-05-15  CLINICAL DATA:  Shortness of breath, decreased oxygen saturation, atrial fibrillation. Former smoker. EXAM: CHEST  2 VIEW COMPARISON:  None in PACs FINDINGS: The lungs are adequately inflated. There small bilateral pleural effusions. The pulmonary interstitial markings are increased. The heart is top-normal in size. The central pulmonary vascularity is prominent. There is mild loss of height of the body of approximately T8. IMPRESSION: Small bilateral pleural effusions with mild interstitial prominence suggests low-grade CHF. Chronic interstitial prominence could also be secondary to the patient's smoking history or to early interstitial pneumonia. Without previous studies it is difficult to be more precise. Electronically Signed   By: David  [place] M.D.   On: May 05, 2015 16:56   Dg Chest Port 1 View  May 05, 2015  CLINICAL DATA:  Acute onset of hypotension and hypothermia. Atrial fibrillation. Initial encounter. EXAM: PORTABLE CHEST 1 VIEW COMPARISON:  Chest radiograph performed earlier today at 4:58 p.m. FINDINGS:  The lungs are well-aerated. Mild bibasilar opacities likely reflect atelectasis. There is no evidence of pleural effusion or pneumothorax. The cardiomediastinal silhouette is borderline normal in size. No acute osseous abnormalities are seen. IMPRESSION: Mild bibasilar airspace opacities likely reflect atelectasis. Lungs otherwise clear. Electronically Signed   By: Roanna Raider M.D.   On: 05-05-2015 23:51     STUDIES:  CXR 03/28 > atx.  CULTURES: Blood 03/29 > Urine 03/29 > Sputum 03/29 >  ANTIBIOTICS: None.  SIGNIFICANT EVENTS: 03/29 > admitted with A.fib, AKI, AGMA, transaminitis, hypothyroidism.   LINES/TUBES: CVL pending by EDP 03/29 >  DISCUSSION: 65 y.o. M with unknown PMH, brought to [facility] ED 03/29 after he was found to have A.fib with RVR at urgent care earlier in the day.  In ED, he was found to have AKI, AGMA, transaminitis, and hypothyroidism.  PCCM was asked to admit.  ASSESSMENT / PLAN:  PULMONARY A: Acute hypoxic respiratory failure + dyspnea - likely related to A.fib. Atelectasis. Former smoker. P:   Continue supplemental O2 as needed to maintain SpO2 > 92%. Would perform ambulatory desaturation study prior to discharge to assess  home O2 needs. Incentive spirometry. CXR intermittently.  CARDIOVASCULAR A:  Intermittent hypotension - question sepsis but no hx to support. ? Cardiogenic from possible decompensated heart failure. A.fib with RVR - rate now controlled.  Was started on lopressor and eliquis at urgent care. Troponin leak - suspect demand ischemia. Elevated BNP + SOB + increasing LE edema - likely has undiagnosed heart failure. P:  Neo-synephrine as needed to maintain MAP > 65. Assess CVP, co-ox (may need additional inotropic support). Heparin gtt for now. Lopressor PRN. Trend troponin, lactate. Assess echo. Cardiology consulted, appreciate the assistance.  RENAL A:   AKI - unknown baseline. AGMA - lactate. P:   NS @ 50. BMP in  AM.  GASTROINTESTINAL A:   Transaminitis - ? Hepatic congestion from undiagnosed heart failure. Nutrition. P:   LFT's in AM. NPO.  HEMATOLOGIC A:   Hemoconcentration. VTE Prophylaxis. P:  SCD's / Heparin gtt. CBC in AM.  INFECTIOUS A:   Leukocytosis - likely acute phase reactant, no indication of infection. P:   Follow cultures as above. Assess PCT - if high, add empiric abx.  ENDOCRINE A:   Hyperglycemia - no hx DM. Hypothyroidism.  P:   SSI. Synthroid, start at 25 mg daily for now (convert to IV formulation). Assess Hgb A1c, TSH.  NEUROLOGIC A:   No acute issues. P:   No interventions required.   Family updated: Cousin updated at bedside.  Interdisciplinary Family Meeting v Palliative Care Meeting:  Due by: 05/01/15.  CC time: 40 minutes.  Rutherford Guysahul Desai, PA - [facility] [place] [facility] Pager: [phone]  or [phone]  Attending Note:  65 year old male with CHF and EF of 10-15% who presents urgent care with SOB.  Labs were drawn and patient was discharged.  Called back and asked to go to the ED when BNP came back severely elevated and his lactic acid level was 4.5.  In the ED, patient was noted to be very hypotensive and was taken to the cath lab for mechanical assist device placement and swan placement.  Patient was also placed on dobutamine and amiodarone drip via cards.  On exam, diffuse crackles.  I reviewed CXR myself, diffuse pulmonary edema noted.  Patient c/o some SOB but not getting tired.  Will hold in the ICU.  Maintain current pressor need.  Impella in place.  Monitor closely for airway protection.  The patient is critically ill with multiple organ systems failure and requires high complexity decision making for assessment and support, frequent evaluation and titration of therapies, application of advanced monitoring technologies and extensive interpretation of multiple databases.   Critical Care Time devoted to  patient care services described in this note is  35  Minutes. This time reflects time of care of this signee Dr Koren BoundWesam Gaetana Kawahara. This critical care time does not reflect procedure time, or teaching time or supervisory time of PA/NP/Med student/Med Resident etc but could involve care discussion time.  Alyson ReedyWesam G. Edword Cu, M.D. [facility]. Pager: [phone]. After hours pager: [phone].

## 2015-04-25 NOTE — Progress Notes (Signed)
ANTICOAGULATION CONSULT NOTE - Initial Consult    Currently on purge solution for Impella pump, running at 11.4 ml/hr per RN (= 570 units of heparin per hour).    ACT low at 142s.    Per protocol, increase total heparin provision by 100 units/hr >> start heparin gtt at 100 units/hr (heparin infusion + purge solution will provide 670 units of heparin per hour).  F/U with next ACT   Kennice Finnie D. Laney Potashang, PharmD, BCPS Pager:  [phone] 04/04/2015, 6:42 PM

## 2015-04-25 NOTE — Progress Notes (Signed)
eLink Physician-Brief Progress Note Patient Name: Wesley DuffelJoseph Tyler DOB: 06/25/1950 MRN: [id number]   Date of Service  04/27/2015  HPI/Events of Note  Post intubation check  eICU Interventions  ABG pending, advance ETT by 2 cm. PPI prophylaxis ordered.     Intervention Category Intermediate Interventions: Other:  [name] 04/06/2015, 8:13 PM

## 2015-04-25 NOTE — ED Notes (Signed)
Cardiologist states pts EF 10%. [facility]

## 2015-04-25 NOTE — Care Management Note (Signed)
Case Management Note  Patient Details  Name: Wesley DuffelJoseph Tyler MRN: [id number] Date of Birth: 05/07/1950  Subjective/Objective:       Adm w fld overload, resp distress             Action/Plan: lives w cousin   Expected Discharge Date:   (unknown)               Expected Discharge Plan:  Home w Home Health Services  In-House Referral:     Discharge planning Services     Post Acute Care Choice:    Choice offered to:     DME Arranged:    DME Agency:     HH Arranged:    HH Agency:     Status of Service:     Medicare Important Message Given:    Date Medicare IM Given:    Medicare IM give by:    Date Additional Medicare IM Given:    Additional Medicare Important Message give by:     If discussed at Long Length of Stay Meetings, dates discussed:    Additional Comments: ur review done  Hanley HaysDowell, Klyn Kroening T, RN 04/10/2015, 10:11 AM

## 2015-04-25 NOTE — Progress Notes (Signed)
ANTICOAGULATION CONSULT NOTE - Initial Consult  Pharmacy Consult for Heparin Indication: atrial fibrillation--bridge PTA apixaban  No Known Allergies  Patient Measurements:   Heparin Dosing Weight:   Vital Signs: Temp: 93.7 F (34.3 C) (03/29 0237) Temp Source: Core (Comment) (03/29 0459) BP: 111/85 mmHg (03/29 0459) Pulse Rate: 87 (03/29 0459)  Labs:  Recent Labs  04/12/2015 1630 04/15/2015 2245  HGB 17.4* 18.7*  HCT 49.8 52.6*  PLT 268 260  CREATININE 1.73* 2.21*  CKTOTAL 372*  --   CKMB 11.2*  --   TROPONINI 0.06*  --     Estimated Creatinine Clearance: 40.9 mL/min (by C-G formula based on Cr of 2.21).   Medical History: History reviewed. No pertinent past medical history.  Medications:   (Not in a hospital admission) Infusions:  . sodium chloride    . sodium chloride    . DOBUTamine    . heparin      Assessment: Patient with afib on chronic apixaban.  MD wants heparin for bridging while off aprixaban.  Last dose of 5mg  noted to be 3/28 at 1900.  PTT ordered with Heparin level until both correlate due to possible drug-lab interaction between oral anticoagulant (rivaroxaban, edoxaban, or apixaban) and anti-Xa level (aka heparin level)   Goal of Therapy:  Heparin level 0.3-0.7 units/ml aPTT 66-102 seconds Monitor platelets by anticoagulation protocol: Yes   Plan:  Heparin drip at 1600 units/hr Daily CBC Heparin level/PTT at 1500   [address]Myleka Moncure Jr, Jacquenette ShoneJulian Crowford 2016/01/05,5:39 AM

## 2015-04-25 NOTE — Procedures (Signed)
Intubation Procedure Note Wesley Tyler [id number] 10/25/1950  Procedure: Intubation Indications: Respiratory insufficiency  Procedure Details Consent: Risks of procedure as well as the alternatives and risks of each were explained to the (patient/caregiver).  Consent for procedure obtained. Time Out: Verified patient identification, verified procedure, site/side was marked, verified correct patient position, special equipment/implants available, medications/allergies/relevent history reviewed, required imaging and test results available.  Performed  Maximum sterile technique was used including gloves, hand hygiene and mask.  MAC    Evaluation Hemodynamic Status: BP stable throughout; O2 sats: stable throughout Patient's Current Condition: stable Complications: No apparent complications Patient did tolerate procedure well. Chest X-ray ordered to verify placement.  CXR: pending.   Wesley Tyler,Wesley Tyler 04/27/2015

## 2015-04-25 NOTE — Consult Note (Signed)
CARDIOLOGY CONSULT NOTE  Patient ID: Wesley DuffelJoseph Tyler MRN: [id number] DOB/AGE: 66/07/1950 66 y.o.  Admit date: 04/05/2015 Primary Physician None Primary Cardiologist None Chief Complaint  SOB Requesting  Dr. Blinda LeatherwoodPollina  HPI:  The patient has no past cardiac history.  He does not see MDs.  He has no past medical diagnosis.  He lives with his only remaining family member who is his cousin.  [place]  He reports that his SOB started about two weeks ago.  He had increasing DOE progressing to SOB at rest.  He has had to sleep while up in a chair recently.  He reports increasing lower extremity edema and a 10 lb weight gain over several days.   He was finally encouraged by his cousin to see her PCP.  He was found to have atrial fib and started on Eliquis and beta blocker two days ago and took one day of the beta blocker and one dose of the anticoagulant.  He was told to come to the ED when his troponin came back mildly elevated.   He denies any fever or chills.  He has had no chest pain.  He denies any palpitations. His cousin reports mildly worse mentation.  In the ED he had worsening mentation. A central line was placed and he was seen by CCM. Neo was started for falling blood pressure.   Bedside echo demonstrates an EF of 10%.    His troponin was 0.01.  BNP 612.  Lactic acid 4.32.    He has an elevated blood sugar.     PMH:  None  Past Surgical History  Procedure Laterality Date  . Wisdom tooth extraction      No Known Allergies  (Not in a hospital admission) Family History  Problem Relation Age of Onset  . Stroke Mother   . ALS Father   . Heart attack Maternal Aunt   . Hypertension Maternal Aunt     Social History   Social History  . Marital Status: Single    Spouse Name: N/A  . Number of Children: N/A  . Years of Education: N/A   Occupational History  . [employer] Retiredl    Social History Main Topics  . Smoking status: Former Games developermoker    . Smokeless tobacco: Never Used  . Alcohol Use: 4.2 - 8.4 oz/week    7-14 Cans of beer per week     Comment: pt states 1-2 beers per night  . Drug Use: No  . Sexual Activity: No   Other Topics Concern  . Not on file   Social History Narrative   Lives with cousin.   [place]      ROS:    As stated in the HPI and negative for all other systems.  Physical Exam: Blood pressure 104/76, pulse 101, temperature 99.3 F (37.4 C), temperature source Core (Comment), resp. rate 24, SpO2 96 %.  GENERAL:  Ill appearing HEENT:  Pupils equal round and reactive, fundi not visualized, oral mucosa unremarkable NECK:  Positive jugular venous distention to the jaw at 45 degrees, waveform within normal limits, carotid upstroke brisk and symmetric, no bruits, no thyromegaly LYMPHATICS:  No cervical, inguinal adenopathy LUNGS:  Clear to auscultation bilaterally BACK:  No CVA tenderness CHEST:  Unremarkable HEART:  PMI not displaced or sustained,S1 and S2 within normal limits, no S3, no S4, no clicks, no rubs, no murmurs, distant heart sounds.  ABD:  Flat, positive bowel sounds normal in frequency in pitch, no bruits, no rebound, no guarding, no midline pulsatile mass, positive hepatomegaly, no splenomegaly EXT:  2 plus pulses upper and absent DP/PT bilateral.  Blue mottled extremities.  Severe lower extremity edema, no clubbing SKIN:  No rashes no nodules NEURO:  Cranial nerves II through XII grossly intact, motor grossly intact throughout PSYCH:  Cognitively intact, oriented to person place and time, speech is slow.    Labs: Lab Results  Component Value Date   BUN 42* 05/21/2015   Lab Results  Component Value Date   CREATININE 2.21* 05-21-15   Lab Results  Component Value Date   NA 134* 05-21-2015   K 5.1 May 21, 2015   CL 101 May 21, 2015   CO2 16* May 21, 2015   Lab Results  Component Value Date   TROPONINI 0.10* 04/08/2015   Lab Results  Component Value  Date   WBC 12.0* 2015-05-21   HGB 18.7* May 21, 2015   HCT 52.6* 2015-05-21   MCV 92.8 2015-05-21   PLT 260 May 21, 2015   No results found for: CHOL, HDL, LDLCALC, LDLDIRECT, TRIG, CHOLHDL Lab Results  Component Value Date   ALT 151* 05/21/15   AST 189* 05-21-2015   ALKPHOS 186* 21-May-2015   BILITOT 3.9* 05-21-2015      Radiology:   CXR: The patient's right IJ line is seen ending about the distal SVC.  A small right pleural effusion is noted, with associated right basilar airspace opacity likely reflecting atelectasis. There is mild elevation of the left hemidiaphragm. No pneumothorax is seen.  The cardiomediastinal silhouette is borderline enlarged. No acute osseous abnormalities are identified.   EKG:  Atrial fib, rate 83, LBBB  ASSESSMENT AND PLAN:   CARDIOGENIC SHOCK:  New diagnosis in a patient without past medical history.  No recent viral prodrome.  No episode consistent with acute ischemia.  Drinks a few beers per week verified by his cousin.  He will be transferred to [facility] and I have contacted the advanced heart failure service.  Need to wean the Neo.  Check coox on transfer.  Inotropic agents if he will tolerate.  May need urgent advanced mechanical support if he continues to decline.  Check echocardiogram this AM.    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  ELEVATED LIVER ENZYMES:  Secondary to hepatic congestion.     UTI:  Proteinuria with many bacteria.  Check urine culture.    ELEVATED BLOOD SUGAR:  New finding.  Check A1C.      ELEVATED TSH:  Mild.  T4 ordered.  IV levothyroxine was ordered by CCM.  I will hold this as TSH was only 6.        SignedRollene Rotunda 04/15/2015, 7:25 AM

## 2015-04-25 NOTE — Progress Notes (Addendum)
  Patient deteriorated with worsening respiratory status and poor cardiac output on swan.  Very mottled and cool peripherally.  Dobutamine switched to milrinone due to SVR > 3,000.   Transthoracic echo done and showed adequate position of the Impella catheter but sitting posteriorly. I rotated it slightly to try to et it more anterior.  On echo there was severe LV dysfunction EF 5-10% and severe RV dysfunction. There was spontaneous appearance of bubbles in the LV and aorta and I was concerned for VSD.   Due to ongoing respiratory distress patient intubated emergently by Dr. Molli KnockYacoub. Developed profound hypotension and norepi started.   One stabilized TEE performed this showed multiple episodes of spontaneous bubbles on the left side but we could not reproduce it reliably with injection of saline through any of the IV ports. No ASD or VSD identified.   Will continue supportive care and diuresis. Prognosis quite poor.   Total critical care time > 60 minutes not including TEE time.   Bensimhon, Daniel,MD 6:22 PM

## 2015-04-25 NOTE — Progress Notes (Addendum)
ANTICOAGULATION CONSULT NOTE - Follow Up Consult  Pharmacy Consult for heparin  Indication: s/p impella pump/afib  No Known Allergies  Patient Measurements: Height: 6\' 4"  (193 cm) Weight: 222 lb 7.1 oz (100.9 kg) IBW/kg (Calculated) : 86.8   Vital Signs: Temp: 98.2 F (36.8 C) (03/29 0745) Temp Source: Core (Comment) (03/29 0625) BP: 106/78 mmHg (03/29 1402) Pulse Rate: 108 (03/29 1402)  Labs:  Recent Labs  04/02/2015 1630 04/23/2015 2245 12/08/2015 0550 12/08/2015 1051  HGB 17.4* 18.7*  --  17.2*  HCT 49.8 52.6*  --  49.4  PLT 268 260  --  262  CREATININE 1.73* 2.21*  --  2.81*  CKTOTAL 372*  --   --   --   CKMB 11.2*  --   --   --   TROPONINI 0.06*  --  0.10*  --     Estimated Creatinine Clearance: 32.2 mL/min (by C-G formula based on Cr of 2.81).   Medications:  Scheduled:  . amiodarone  150 mg Intravenous Once  . furosemide  40 mg Intravenous BID  . insulin aspart  0-15 Units Subcutaneous 6 times per day  . sodium chloride flush  3 mL Intravenous Q12H  . sodium chloride flush  3 mL Intravenous Q12H    Assessment: 65 yo male with cardiogenic shock s/p RHC. Cardiac index= 1.3 and he is now on an impella pump and to start a heparin purge solution with ACT monitoring  -purge rate 13.668ml/hr= 690 units/hr -initial ACT 1 hour after the purge was started: 157  Goal of Therapy:  ACT= 160-180 Monitor platelets by anticoagulation protocol: Yes   Plan:  -Will follow ACTs  -Begin systemic IV heparin when first ACT < 160 -Will recheck the ACT in hr and begin heparin if remains < 160 --Will follow ACTs and purge rates  Harland GermanAndrew Fitzroy Mikami, Pharm D 03-Oct-2015 3:05 PM

## 2015-04-25 NOTE — Addendum Note (Signed)
Addended by: Collie SiadICHARDSON, See Beharry M on: 04/21/2015 01:26 PM   Modules accepted: Orders

## 2015-04-25 NOTE — Procedures (Signed)
OGT Insertion By MD  NGT placed under direct laryngoscopy and verified audibly.  Alyson ReedyWesam G. [name], M.D. [facility]. Pager: [phone]. After hours pager: [phone].

## 2015-04-25 NOTE — Procedures (Signed)
Arterial Catheter Insertion Procedure Note Wesley Tyler [id number] 07/30/1950  Procedure: Insertion of Arterial Catheter  Indications: Blood pressure monitoring  Procedure Details Consent: Unable to obtain consent because of emergent medical necessity. Time Out: Verified patient identification, verified procedure, site/side was marked, verified correct patient position, special equipment/implants available, medications/allergies/relevent history reviewed, required imaging and test results available.  Performed  Maximum sterile technique was used including antiseptics, cap, gloves, gown, hand hygiene, mask and sheet. Skin prep: Chlorhexidine; local anesthetic administered 20 gauge catheter was inserted into right radial artery using the Seldinger technique.  Evaluation Blood flow good; BP tracing good. Complications: No apparent complications.   Wesley Tyler, Wesley Tyler 04/18/2015

## 2015-04-26 ENCOUNTER — Inpatient Hospital Stay (HOSPITAL_COMMUNITY): Payer: Medicare Other

## 2015-04-26 LAB — POCT ACTIVATED CLOTTING TIME
ACTIVATED CLOTTING TIME: 131 s
ACTIVATED CLOTTING TIME: 132 s
ACTIVATED CLOTTING TIME: 137 s
ACTIVATED CLOTTING TIME: 137 s
ACTIVATED CLOTTING TIME: 137 s
ACTIVATED CLOTTING TIME: 137 s
ACTIVATED CLOTTING TIME: 142 s
ACTIVATED CLOTTING TIME: 147 s
ACTIVATED CLOTTING TIME: 147 s
ACTIVATED CLOTTING TIME: 157 s
ACTIVATED CLOTTING TIME: 157 s
Activated Clotting Time: 131 seconds
Activated Clotting Time: 136 seconds
Activated Clotting Time: 142 seconds
Activated Clotting Time: 136 seconds
Activated Clotting Time: 147 seconds
Activated Clotting Time: 147 seconds
Activated Clotting Time: 147 seconds
Activated Clotting Time: 167 seconds
Activated Clotting Time: 173 seconds
Activated Clotting Time: 173 seconds

## 2015-04-26 LAB — CK: Total CK: 1041 U/L — ABNORMAL HIGH (ref 49–397)

## 2015-04-26 LAB — CBC
HEMATOCRIT: 40.9 % (ref 39.0–52.0)
HEMATOCRIT: 41.2 % (ref 39.0–52.0)
HEMOGLOBIN: 14.2 g/dL (ref 13.0–17.0)
HEMOGLOBIN: 14.3 g/dL (ref 13.0–17.0)
MCH: 31.3 pg (ref 26.0–34.0)
MCH: 31.4 pg (ref 26.0–34.0)
MCHC: 34.7 g/dL (ref 30.0–36.0)
MCHC: 34.7 g/dL (ref 30.0–36.0)
MCV: 90.1 fL (ref 78.0–100.0)
MCV: 90.5 fL (ref 78.0–100.0)
Platelets: 207 10*3/uL (ref 150–400)
Platelets: 209 10*3/uL (ref 150–400)
RBC: 4.54 MIL/uL (ref 4.22–5.81)
RBC: 4.55 MIL/uL (ref 4.22–5.81)
RDW: 13.1 % (ref 11.5–15.5)
RDW: 13.1 % (ref 11.5–15.5)
WBC: 17.8 10*3/uL — AB (ref 4.0–10.5)
WBC: 24.3 10*3/uL — ABNORMAL HIGH (ref 4.0–10.5)

## 2015-04-26 LAB — LIPID PANEL
CHOL/HDL RATIO: 3.9 ratio
Cholesterol: 101 mg/dL (ref 0–200)
HDL: 26 mg/dL — ABNORMAL LOW (ref >40)
LDL CALC: 65 mg/dL (ref 0–99)
Triglycerides: 52 mg/dL (ref <150)
VLDL: 10 mg/dL (ref 0–40)

## 2015-04-26 LAB — PHOSPHORUS: PHOSPHORUS: 5.1 mg/dL — AB (ref 2.5–4.6)

## 2015-04-26 LAB — COMPREHENSIVE METABOLIC PANEL
ALK PHOS: 114 U/L (ref 38–126)
ALT: 601 U/L — AB (ref 17–63)
AST: 1472 U/L — ABNORMAL HIGH (ref 15–41)
Albumin: 2.7 g/dL — ABNORMAL LOW (ref 3.5–5.0)
Anion gap: 13 (ref 5–15)
BILIRUBIN TOTAL: 5.4 mg/dL — AB (ref 0.3–1.2)
BUN: 69 mg/dL — ABNORMAL HIGH (ref 6–20)
CALCIUM: 8.1 mg/dL — AB (ref 8.9–10.3)
CO2: 17 mmol/L — AB (ref 22–32)
CREATININE: 3.56 mg/dL — AB (ref 0.61–1.24)
Chloride: 104 mmol/L (ref 101–111)
GFR, EST AFRICAN AMERICAN: 19 mL/min — AB (ref >60)
GFR, EST NON AFRICAN AMERICAN: 17 mL/min — AB (ref >60)
Glucose, Bld: 117 mg/dL — ABNORMAL HIGH (ref 65–99)
Potassium: 4 mmol/L (ref 3.5–5.1)
Sodium: 134 mmol/L — ABNORMAL LOW (ref 135–145)
TOTAL PROTEIN: 4.9 g/dL — AB (ref 6.5–8.1)

## 2015-04-26 LAB — POCT I-STAT 3, ART BLOOD GAS (G3+)
ACID-BASE DEFICIT: 2 mmol/L (ref 0.0–2.0)
BICARBONATE: 21.7 meq/L (ref 20.0–24.0)
BICARBONATE: 23.3 meq/L (ref 20.0–24.0)
O2 SAT: 100 %
O2 Saturation: 100 %
PCO2 ART: 33.2 mmHg — AB (ref 35.0–45.0)
PH ART: 7.412 (ref 7.350–7.450)
PO2 ART: 201 mmHg — AB (ref 80.0–100.0)
TCO2: 23 mmol/L (ref 0–100)
TCO2: 24 mmol/L (ref 0–100)
pCO2 arterial: 33.9 mmHg — ABNORMAL LOW (ref 35.0–45.0)
pH, Arterial: 7.453 — ABNORMAL HIGH (ref 7.350–7.450)
pO2, Arterial: 475 mmHg — ABNORMAL HIGH (ref 80.0–100.0)

## 2015-04-26 LAB — GLUCOSE, CAPILLARY
GLUCOSE-CAPILLARY: 114 mg/dL — AB (ref 65–99)
GLUCOSE-CAPILLARY: 136 mg/dL — AB (ref 65–99)
GLUCOSE-CAPILLARY: 144 mg/dL — AB (ref 65–99)
GLUCOSE-CAPILLARY: 141 mg/dL — AB (ref 65–99)
Glucose-Capillary: 117 mg/dL — ABNORMAL HIGH (ref 65–99)
Glucose-Capillary: 143 mg/dL — ABNORMAL HIGH (ref 65–99)
Glucose-Capillary: 162 mg/dL — ABNORMAL HIGH (ref 65–99)

## 2015-04-26 LAB — RENAL FUNCTION PANEL
ALBUMIN: 2.5 g/dL — AB (ref 3.5–5.0)
Anion gap: 18 — ABNORMAL HIGH (ref 5–15)
BUN: 77 mg/dL — AB (ref 6–20)
CHLORIDE: 103 mmol/L (ref 101–111)
CO2: 12 mmol/L — ABNORMAL LOW (ref 22–32)
CREATININE: 4.47 mg/dL — AB (ref 0.61–1.24)
Calcium: 7.9 mg/dL — ABNORMAL LOW (ref 8.9–10.3)
GFR, EST AFRICAN AMERICAN: 15 mL/min — AB (ref >60)
GFR, EST NON AFRICAN AMERICAN: 13 mL/min — AB (ref >60)
Glucose, Bld: 140 mg/dL — ABNORMAL HIGH (ref 65–99)
PHOSPHORUS: 5.2 mg/dL — AB (ref 2.5–4.6)
POTASSIUM: 5.1 mmol/L (ref 3.5–5.1)
Sodium: 133 mmol/L — ABNORMAL LOW (ref 135–145)

## 2015-04-26 LAB — DIFFERENTIAL
Basophils Absolute: 0.1 10*3/uL (ref 0.0–0.1)
Basophils Relative: 1 %
Eosinophils Absolute: 0.1 10*3/uL (ref 0.0–0.7)
Eosinophils Relative: 1 %
LYMPHS ABS: 1.3 10*3/uL (ref 0.7–4.0)
LYMPHS PCT: 5 %
MONO ABS: 2.6 10*3/uL — AB (ref 0.1–1.0)
MONOS PCT: 11 %
NEUTROS ABS: 20.1 10*3/uL — AB (ref 1.7–7.7)
Neutrophils Relative %: 82 %

## 2015-04-26 LAB — LACTATE DEHYDROGENASE: LDH: 2267 U/L — AB (ref 98–192)

## 2015-04-26 LAB — CARBOXYHEMOGLOBIN
Carboxyhemoglobin: 1.1 % (ref 0.5–1.5)
Methemoglobin: 1.3 % (ref 0.0–1.5)
O2 SAT: 76.9 %
TOTAL HEMOGLOBIN: 14.1 g/dL (ref 13.5–18.0)

## 2015-04-26 LAB — LACTIC ACID, PLASMA: LACTIC ACID, VENOUS: 3 mmol/L — AB (ref 0.5–2.0)

## 2015-04-26 LAB — HEMOGLOBIN A1C
Hgb A1c MFr Bld: 6.6 % — ABNORMAL HIGH (ref 4.8–5.6)
Mean Plasma Glucose: 143 mg/dL

## 2015-04-26 LAB — SODIUM, URINE, RANDOM: SODIUM UR: 64 mmol/L

## 2015-04-26 LAB — MAGNESIUM: MAGNESIUM: 2.5 mg/dL — AB (ref 1.7–2.4)

## 2015-04-26 LAB — CREATININE, URINE, RANDOM: Creatinine, Urine: 122.93 mg/dL

## 2015-04-26 MED ORDER — PRO-STAT SUGAR FREE PO LIQD
30.0000 mL | Freq: Three times a day (TID) | ORAL | Status: DC
  Administered 2015-04-28 – 2015-05-01 (×9): 30 mL
  Filled 2015-04-26 (×10): qty 30

## 2015-04-26 MED ORDER — HEPARIN SODIUM (PORCINE) 1000 UNIT/ML DIALYSIS
1000.0000 [IU] | INTRAMUSCULAR | Status: DC | PRN
Start: 2015-04-26 — End: 2015-05-07
  Filled 2015-04-26: qty 4
  Filled 2015-04-26: qty 6
  Filled 2015-04-26: qty 4

## 2015-04-26 MED ORDER — ANTISEPTIC ORAL RINSE SOLUTION (CORINZ)
7.0000 mL | OROMUCOSAL | Status: DC
  Administered 2015-04-26 – 2015-05-03 (×74): 7 mL via OROMUCOSAL

## 2015-04-26 MED ORDER — PIPERACILLIN-TAZOBACTAM 3.375 G IVPB
3.3750 g | Freq: Once | INTRAVENOUS | Status: AC
  Administered 2015-04-26: 3.375 g via INTRAVENOUS
  Filled 2015-04-26: qty 50

## 2015-04-26 MED ORDER — VITAL 1.5 CAL PO LIQD
1000.0000 mL | ORAL | Status: DC
  Administered 2015-04-28 – 2015-04-30 (×5): 1000 mL
  Filled 2015-04-26 (×9): qty 1000

## 2015-04-26 MED ORDER — CHLORHEXIDINE GLUCONATE 0.12% ORAL RINSE (MEDLINE KIT)
15.0000 mL | Freq: Two times a day (BID) | OROMUCOSAL | Status: DC
  Administered 2015-04-26 – 2015-05-03 (×15): 15 mL via OROMUCOSAL

## 2015-04-26 MED ORDER — "THROMBI-PAD 3""X3"" EX PADS"
1.0000 | MEDICATED_PAD | Freq: Once | CUTANEOUS | Status: AC
  Administered 2015-04-26: 1 via TOPICAL
  Filled 2015-04-26: qty 1

## 2015-04-26 MED ORDER — SODIUM CHLORIDE 0.9% FLUSH
10.0000 mL | Freq: Two times a day (BID) | INTRAVENOUS | Status: DC
  Administered 2015-04-26 – 2015-05-06 (×18): 10 mL

## 2015-04-26 MED ORDER — VITAL HIGH PROTEIN PO LIQD: 1000.0000 mL | ORAL | Status: DC

## 2015-04-26 MED ORDER — SODIUM CHLORIDE 0.9% FLUSH
10.0000 mL | INTRAVENOUS | Status: DC | PRN
  Administered 2015-04-29: 10 mL
  Filled 2015-04-26: qty 40

## 2015-04-26 MED ORDER — SODIUM CHLORIDE 0.9 % IV SOLN
0.0100 [IU]/min | INTRAVENOUS | Status: DC
  Administered 2015-04-26 – 2015-04-29 (×5): 0.03 [IU]/min via INTRAVENOUS
  Filled 2015-04-26 (×6): qty 2

## 2015-04-26 MED ORDER — HEPARIN 1000 UNIT/ML FOR PERITONEAL DIALYSIS
4000.0000 [IU] | Freq: Once | INTRAMUSCULAR | Status: DC
  Filled 2015-04-26 (×2): qty 4

## 2015-04-26 MED ORDER — PIPERACILLIN-TAZOBACTAM 3.375 G IVPB
3.3750 g | Freq: Four times a day (QID) | INTRAVENOUS | Status: DC
  Administered 2015-04-26 – 2015-04-30 (×14): 3.375 g via INTRAVENOUS
  Filled 2015-04-26 (×17): qty 50

## 2015-04-26 MED ORDER — PRISMASOL BGK 4/2.5 32-4-2.5 MEQ/L IV SOLN
INTRAVENOUS | Status: DC
  Administered 2015-04-26 – 2015-05-07 (×69): via INTRAVENOUS_CENTRAL
  Filled 2015-04-26 (×95): qty 5000

## 2015-04-26 MED ORDER — PRISMASOL BGK 4/2.5 32-4-2.5 MEQ/L IV SOLN
INTRAVENOUS | Status: DC
  Administered 2015-04-26 – 2015-05-06 (×20): via INTRAVENOUS_CENTRAL
  Filled 2015-04-26 (×27): qty 5000

## 2015-04-26 MED ORDER — VANCOMYCIN HCL IN DEXTROSE 1-5 GM/200ML-% IV SOLN
1000.0000 mg | INTRAVENOUS | Status: DC
Start: 2015-04-27 — End: 2015-04-30
  Administered 2015-04-27 – 2015-04-29 (×3): 1000 mg via INTRAVENOUS
  Filled 2015-04-26 (×4): qty 200

## 2015-04-26 MED ORDER — SODIUM CHLORIDE 0.9 % FOR CRRT
INTRAVENOUS_CENTRAL | Status: DC | PRN
  Filled 2015-04-26: qty 1000

## 2015-04-26 MED ORDER — SODIUM CHLORIDE 0.9 % IJ SOLN: 250.0000 [IU]/h | INTRAMUSCULAR | Status: DC

## 2015-04-26 MED ORDER — EPINEPHRINE HCL 1 MG/ML IJ SOLN
0.5000 ug/min | INTRAVENOUS | Status: DC
  Administered 2015-04-26: 2 ug/min via INTRAVENOUS
  Filled 2015-04-26: qty 4

## 2015-04-26 MED ORDER — HEPARIN SODIUM (PORCINE) 1000 UNIT/ML IJ SOLN
4000.0000 [IU] | Freq: Once | INTRAMUSCULAR | Status: AC
  Administered 2015-04-26: 3000 [IU] via INTRAVENOUS

## 2015-04-26 MED ORDER — HEPARIN BOLUS VIA INFUSION (CRRT)
1000.0000 [IU] | INTRAVENOUS | Status: DC | PRN
  Filled 2015-04-26: qty 1000

## 2015-04-26 MED ORDER — EPINEPHRINE HCL 1 MG/ML IJ SOLN
0.5000 ug/min | INTRAVENOUS | Status: DC
  Filled 2015-04-26: qty 4

## 2015-04-26 MED ORDER — VANCOMYCIN HCL 10 G IV SOLR
2000.0000 mg | Freq: Once | INTRAVENOUS | Status: AC
  Administered 2015-04-26: 2000 mg via INTRAVENOUS
  Filled 2015-04-26: qty 2000

## 2015-04-26 MED ORDER — PRISMASOL BGK 4/2.5 32-4-2.5 MEQ/L IV SOLN
INTRAVENOUS | Status: DC
  Administered 2015-04-26 – 2015-05-07 (×35): via INTRAVENOUS_CENTRAL
  Filled 2015-04-26 (×40): qty 5000

## 2015-04-26 MED FILL — Heparin Sodium (Porcine) 2 Unit/ML in Sodium Chloride 0.9%: INTRAMUSCULAR | Qty: 500 | Status: AC

## 2015-04-26 NOTE — Progress Notes (Signed)
This RN observed pt's  Arterial pressure are ~7030mmhg lower than the cuff pressure and impella pressures. Arterial line flushed and rezeroed to verify proper placement with no change. Dr. Gala RomneyBensimhon made aware. Verbal orders given to treat off cuff pressures and impella pressures. Will continue to monitor and assess pt closely.

## 2015-04-26 NOTE — Progress Notes (Signed)
Nutrition Brief Note  RD consulted to begin enteral feeds. Ordered Adult Tube Feeding Protocol. Ordered Vital 1.5 according to tubefeeding recs in previous note. Consult RD for any other needs.  Thanks  Dionne AnoWilliam M. Sereen Schaff, MS, RD LDN After Hours/Weekend Pager [phone]

## 2015-04-26 NOTE — Progress Notes (Signed)
PULMONARY / CRITICAL CARE MEDICINE   Name: Wesley Tyler Mirkin MRN: [id number] DOB: 11/11/1950    ADMISSION DATE:  04/16/2015 CONSULTATION DATE:  04/09/2015  REFERRING MD:  EDP  CHIEF COMPLAINT:  SOB  HISTORY OF PRESENT ILLNESS:  Wesley Tyler Mcmahan is a 65 y.o. male with no known PMH.  He had not seen a health care provider since his 6520's and on 04/15/2015, his cousin made him go to an urgent care center to establish care and for further evaluation of occasional palpitations that pt had been experiencing on and off.  Palpitations had apparently worsened over the past 6 months and for the past 2 weeks, his heart had been continually beating "fast and hard".  He had not had any exacerbating or alleviating factors.  He has had associated severe SOB that is worse with exertion, increase in LE edema, PND and orthopnea.  Per cousin, pt sleeps in a chair sitting upright.   He has also had mild intermittent cough that was non productive.  Denies any fevers/chills/sweats, headaches, lightheadedness, chest pain, N/V/D, abd pain, myalgias.  At urgent care he was found to be in A.fib with RVR and was treated with lopressor and eliquis.  Lab work showed elevated BNP and troponin so pt was instructed to come to ED for further evaluation.  In ED, he had AKI with SCr 1.79 > 2.21 (unknown baseline), lactic acidosis (lactate 4.3), transaminitis, TSH 6.  PCCM was called for admission.   SUBJECTIVE:  Intubated for resp distress on 3/29.  On impella. On milrinone and levophed.  BP trending down this am >> started on vaso,epi drip. With low grade fever and dec SVR. abx started. Dec in u.o.   VITAL SIGNS: BP 111/84 mmHg  Pulse 115  Temp(Src) 100.2 F (37.9 C) (Core (Comment))  Resp 18  Ht 6\' 4"  (1.93 m)  Wt 212 lb 4.9 oz (96.3 kg)  BMI 25.85 kg/m2  SpO2 98%  HEMODYNAMICS: PAP: (30-107)/(16-87) 35/23 mmHg CVP:  [8 mmHg-26 mmHg] 10 mmHg PCWP:  [14 mmHg-32 mmHg] 16 mmHg CO:  [2 L/min-6.7 L/min] 6.7 L/min CI:  [0.8  L/min/m2-2.9 L/min/m2] 2.9 L/min/m2  VENTILATOR SETTINGS: Vent Mode:  [-] PRVC FiO2 (%):  [40 %-100 %] 40 % Set Rate:  [18 bmp] 18 bmp Vt Set:  [500 mL-620 mL] 620 mL PEEP:  [10 cmH20] 10 cmH20 Plateau Pressure:  [17 cmH20-20 cmH20] 20 cmH20  INTAKE / OUTPUT: I/O last 3 completed shifts: In: 1941.2 [I.V.:1716.2; Other:225] Out: 570 [Urine:570]  PHYSICAL EXAMINATION: General: Caucasian male, in NAD. Neuro: A&O x 3, CN grossly intact HEENT: [place]/AT. PERRL, sclerae anicteric. Cardiovascular: IRIR, no M/R/G.  Lungs: Respirations even and unlabored. Bibasilar crackles  Abdomen: BS x 4, soft, NT/ND.  Musculoskeletal: No gross deformities, 3+ edema to knees. Skin: Intact, cool, no rashes.  LABS:  BMET  Recent Labs Lab 04/05/2015 1051 04/08/2015 1530 04/26/15 0400  NA 136 136 134*  K 5.2* 4.6 4.0  CL 104 103 104  CO2 19* 18* 17*  BUN 50* 55* 69*  CREATININE 2.81* 2.93* 3.56*  GLUCOSE 142* 147* 117*    Electrolytes  Recent Labs Lab 04/04/2015 1051 04/12/2015 1530 04/26/15 0400  CALCIUM 9.0 8.4* 8.1*  MG 2.8*  --  2.5*  PHOS 6.4*  --  5.1*    CBC  Recent Labs Lab 04/27/2015 1051 04/13/2015 1530 04/26/15 0400  WBC 16.1* 12.6* 17.8*  HGB 17.2* 15.1 14.2  HCT 49.4 44.7 40.9  PLT 262 217 207    Coag's  No results for input(s): APTT, INR in the last 168 hours.  Sepsis Markers  Recent Labs Lab 05-03-2015 0146 05/03/15 0357 05-03-2015 0550  LATICACIDVEN 4.32* 4.53* 2.7*  PROCALCITON  --   --  0.16    ABG  Recent Labs Lab May 03, 2015 0146 May 03, 2015 2018 04/26/15 0405  PHART 7.459* 7.412 7.453*  PCO2ART 17.1* 33.9* 33.2*  PO2ART 457* 475.0* 201.0*    Liver Enzymes  Recent Labs Lab 05/03/15 1051 05/03/2015 1530 04/26/15 0400  AST 1012* 1177* 1472*  ALT 765* 721* 601*  ALKPHOS 156* 136* 114  BILITOT 2.4* 2.5* 5.4*  ALBUMIN 3.3* 3.0* 2.7*    Cardiac Enzymes  Recent Labs Lab 04/07/2015 1630 05-03-15 0550  TROPONINI 0.06* 0.10*    Glucose  Recent  Labs Lab 2015-05-03 0858 2015-05-03 1634 May 03, 2015 2141 04/26/15 0019 04/26/15 0411 04/26/15 0757  GLUCAP 149* 133* 162* 143* 117* 114*    Imaging Dg Chest Port 1 View  04/26/2015  CLINICAL DATA:  65 year old male with respiratory failure. Cardiogenic shock. Initial encounter. EXAM: PORTABLE CHEST 1 VIEW COMPARISON:  05-03-2015 and earlier. FINDINGS: Portable AP semi upright view at 0434 hours. Endotracheal tube tip in good position just below the clavicles. Stable right IJ approach Swan-Ganz catheter, tip at the right lower lobe pulmonary artery at the hilum. Impella device re- demonstrated over the cardiac apex. Pacer or resuscitation pads in place. Multiple EKG leads and wires overlie the chest. Stable cardiac size and mediastinal contours. Stable lung volumes in ventilation. Mild veiling opacity at both lung bases. Stable pulmonary vascularity without overt edema. No pneumothorax. No air bronchograms identified. IMPRESSION: 1.  Stable lines and tubes. 2. Stable ventilation with suspected bilateral pleural effusions, no overt edema. Electronically Signed   By: Odessa Fleming M.D.   On: 04/26/2015 08:29   Dg Chest Port 1 View  05-03-15  CLINICAL DATA:  Intubated EXAM: PORTABLE CHEST 1 VIEW COMPARISON:  Chest radiograph from earlier today. FINDINGS: Right internal jugular Swan-Ganz catheter terminates over the distal right pulmonary artery. Inferior approach Impella device terminates over the left ventricle. Endotracheal tube tip is 8 cm above the carina at the thoracic inlet. Stable cardiomediastinal silhouette with top-normal heart size. No pneumothorax. No pleural effusion. Mild pulmonary edema appears slightly decreased. IMPRESSION: 1. Endotracheal tube tip is 8 cm above the carina at the thoracic inlet, consider advancing 2-3 cm. 2. Mild pulmonary edema, slightly decreased. Electronically Signed   By: Delbert Phenix M.D.   On: May 03, 2015 19:31   Dg Chest Port 1 View  2015-05-03  CLINICAL DATA:   Swan-Ganz catheter placement, status post right heart catheterization EXAM: PORTABLE CHEST 1 VIEW COMPARISON:  2015/05/03 FINDINGS: Borderline cardiomegaly again noted. Elevation of the left hemidiaphragm again noted. Stable small right pleural effusion with bibasilar atelectasis. There is right IJ Swan-Ganz catheter with tip in the region of right main pulmonary artery. No pneumothorax. IMPRESSION: Right IJ Swan-Ganz catheter with tip in the region of right main pulmonary artery. Persistent small right pleural effusion with right basilar atelectasis. No pneumothorax. Electronically Signed   By: Natasha Mead M.D.   On: 03-May-2015 15:30     STUDIES:  CXR 03/28 > pulm edema TEE (3/29) EF 5-10%, diffuse hypokinesis, mod AI and mod MR, severe TR  CULTURES: Blood 03/29 > Urine 03/29 > Sputum 03/29 >  ANTIBIOTICS: None.  SIGNIFICANT EVENTS: 03/29 > admitted with A.fib, AKI, AGMA, transaminitis, hypothyroidism. Pt intubated for resp distress.EF 5-10%  LINES/TUBES: CVL pending by EDP 03/29 >  DISCUSSION: 64  y.o. M with unknown PMH, brought to [facility] ED 03/29 after he was found to have A.fib with RVR at urgent care earlier in the day.  In ED, he was found to have AKI, AGMA, transaminitis, and hypothyroidism.  PCCM was asked to admit.  ASSESSMENT / PLAN:  PULMONARY A: Acute hypoxic respiratory failure 2/2 acute CHF exacerbation/pulm edema; R/O HCAP Former smoker. P:   Cont vent support  See cards section Needs diuresis See ID section  CARDIOVASCULAR A:  Cardiogenic shock 2/2 Acute CHF exacerbation, EF 5-10%, CAD (diffuse hypokinesis) S/P Impella (3/29) A.fib with RVR   P:  Cardiology and CHF team following. Appreciate input. Pt with swan catheter. PCWP 16 Cont milrinone, levophed drip, vasopressin, epi drip Agree with holding off on diuresis 2/2 higher creat, PCWP 16, better CI Heparin gtt, amiodarone drip  RENAL A:   AKI - unknown baseline. Worsening renal fxn. AKI 2/2 ATN,  cardio renal syndrome AGMA - lactate.  P:   Holding off on diuresis as creat is higher May need CVVH Renal consulted  GASTROINTESTINAL A:   Transaminitis  2/2 Hepatic congestion fromCHF P:   LFT's in AM Start TF  HEMATOLOGIC A:   Hemoconcentration. VTE Prophylaxis. P:  SCD's / Heparin gtt. CBC in AM.  INFECTIOUS A:   Leukocytosis with fever, dec SVR P:   Follow cultures as above. Vanc/zosyn   ENDOCRINE A:   Hyperglycemia - no hx DM. Hypothyroidism.  P:   SSI. Synthroid - cont Assess Hgb A1c, TSH.  NEUROLOGIC A:   No acute issues. P:   On propofol and fentanyl drips  Code status (per RN and cards/CHF ) pt is no CPR.   Family updated: No family at bedside  Interdisciplinary Family Meeting v Palliative Care Meeting:  Due by: 05/01/15.  CC time: 35 minutes critical care time spent with this pt today.    Pollie Meyer, MD 04/26/2015, 12:11 PM [facility] Pager [phone] After 3 pm or if no answer, call [phone]

## 2015-04-26 NOTE — Progress Notes (Addendum)
Pharmacy Antibiotic Note  Pamala DuffelJoseph Wyke is a 65 y.o. male admitted on 04/23/2015 with cardiogenic shock requiring an impella. He is febrile this morning with leukocytosis. UA dirty. Pharmacy has been consulted for vancomycin and zosyn dosing. His renal function is also worsening and he is now not making any urine. Renal consulted and will likely start CRRT today.  Plan: 1) Vancomycin 2000mg  x 1 2) Zosyn 3.375g x 1 3) Follow up renal plan, CRRT orders to enter maintenance doses  Addendum: CRRT orders placed. Will schedule vancomycin 1000mg  IV q24 (to start 3/31) and zosyn 3.375g IV q6.  Height: 6\' 4"  (193 cm) Weight: 212 lb 4.9 oz (96.3 kg) IBW/kg (Calculated) : 86.8  Temp (24hrs), Avg:99.1 F (37.3 C), Min:97.5 F (36.4 C), Max:100.2 F (37.9 C)   Recent Labs Lab 04/02/2015 1630 04/07/2015 2245 04/07/2015 0146 04/13/2015 0357 04/15/2015 0550 04/24/2015 1051 04/24/2015 1530 04/26/15 0400  WBC 10.9* 12.0*  --   --   --  16.1* 12.6* 17.8*  CREATININE 1.73* 2.21*  --   --   --  2.81* 2.93* 3.56*  LATICACIDVEN  --   --  4.32* 4.53* 2.7*  --   --   --     Estimated Creatinine Clearance: 25.4 mL/min (by C-G formula based on Cr of 3.56).    No Known Allergies  Antimicrobials this admission: 3/30 Vancomycin >> 3/30 Zosyn >>  Dose adjustments this admission: n/a  Microbiology results: 3/30 urine >> 3/30 blood x2 >>  Thank you for allowing pharmacy to be a part of this patient's care.  Fredrik RiggerMarkle, Jennavieve Arrick Sue 04/26/2015 12:30 PM

## 2015-04-26 NOTE — Procedures (Signed)
Central Venous Catheter Insertion Procedure Note Wesley Tyler [id number] 12/01/1950  Procedure: Insertion of Central Venous Trailysis Catheter Indications: Dialysis  Procedure Details Consent: Risks of procedure as well as the alternatives and risks of each were explained to the (patient/caregiver).  Consent for procedure obtained. Time Out: Verified patient identification, verified procedure, site/side was marked, verified correct patient position, special equipment/implants available, medications/allergies/relevent history reviewed, required imaging and test results available.  Performed  Maximum sterile technique was used including antiseptics, cap, gloves, gown, hand hygiene, mask and sheet. Skin prep: Chlorhexidine; local anesthetic administered A antimicrobial bonded/coated triple lumen catheter was placed in the right femoral vein due to patient being a dialysis patient using the Seldinger technique.  Evaluation Blood flow good Complications: No apparent complications Patient did tolerate procedure well.   Wesley Tyler, Wesley Khokhar MD 04/26/2015, 12:44 PM

## 2015-04-26 NOTE — Progress Notes (Signed)
Advanced Heart Failure Rounding Note  PCP: Recently established with Iran Planas, PA-C - Family medicine (04/04/2015) Primary Cardiologist: New  Subjective:    He became critically worse yesterday with C.I. of 0.84 despite Impella and Dobutamine 1.  Dobutamine switched to milrinone as he was also getting amiodarone.  De-satted into 50% with profound respiratory fatigue and was intubated.   Remains intubated. Now on milrinone 0.375 and levophed 35. Impella at P8.  CVP 10-11. Morning lasix held.  Creatinine up to 3.5 with minimal UOP.  Cardiac output improved.   Remains in atrial fibrillation, HR in 100s on amiodarone.  TEE (3/29):  EF 5-10% diffuse hypokinesis, dilated RV with severely decreased systolic function, moderate AI, moderate MR, severe TR. Spontaneous left to right bubbles but unable to discover source (not ASD or VSD, ?pulmonary AVMs).   Intubated, sedated.   Swan numbers: CVP 11 PA 37/23 PCWP 16 CI 2.76 Co-ox 77%  Objective:   Weight Range: 212 lb 4.9 oz (96.3 kg) Body mass index is 25.85 kg/(m^2).   Vital Signs:   Temp:  [97.5 F (36.4 C)-99.7 F (37.6 C)] 99.7 F (37.6 C) (03/30 0700) Pulse Rate:  [0-113] 88 (03/29 1715) Resp:  [0-41] 18 (03/30 0700) BP: (65-191)/(41-166) 89/77 mmHg (03/30 0700) SpO2:  [0 %-100 %] 97 % (03/30 0700) Arterial Line BP: (93-141)/(61-91) 93/61 mmHg (03/30 0700) FiO2 (%):  [40 %-100 %] 40 % (03/30 0416) Weight:  [212 lb 4.9 oz (96.3 kg)-222 lb 7.1 oz (100.9 kg)] 212 lb 4.9 oz (96.3 kg) (03/30 0500)    Weight change: Filed Weights   04/07/2015 1000 04/26/15 0500  Weight: 222 lb 7.1 oz (100.9 kg) 212 lb 4.9 oz (96.3 kg)    Intake/Output:   Intake/Output Summary (Last 24 hours) at 04/26/15 0719 Last data filed at 04/26/15 0700  Gross per 24 hour  Intake 1942.23 ml  Output    520 ml  Net 1422.23 ml     Physical Exam: General:  Intubated, sedated. HEENT: normal Neck: supple. JVP 9-10. Carotids 2+ bilat; no bruits.  No thyromegaly or lymphadenopathy.  Cor: PMI displaced laterally. Irregular rate & rhythm. +S3 Lungs: Mechanical breath sounds Abdomen: soft, NT, ND, no HSM. No bruits or masses. +BS  Extremities: Dusky and cold to touch to mid LLE. 2-3+ edema into thighs. Unable to palpate pedal pulses. Neuro: Intubated, sedated.  Telemetry: Reviewed, Afib  Labs: CBC  Recent Labs  04/14/2015 1051 04/06/2015 1530 04/26/15 0400  WBC 16.1* 12.6* 17.8*  NEUTROABS 13.1* 10.6*  --   HGB 17.2* 15.1 14.2  HCT 49.4 44.7 40.9  MCV 93.6 92.9 90.1  PLT 262 217 817   Basic Metabolic Panel  Recent Labs  04/15/2015 1051 04/06/2015 1530 04/26/15 0400  NA 136 136 134*  K 5.2* 4.6 4.0  CL 104 103 104  CO2 19* 18* 17*  GLUCOSE 142* 147* 117*  BUN 50* 55* 69*  CREATININE 2.81* 2.93* 3.56*  CALCIUM 9.0 8.4* 8.1*  MG 2.8*  --  2.5*  PHOS 6.4*  --  5.1*   Liver Function Tests  Recent Labs  04/05/2015 1530 04/26/15 0400  AST 1177* 1472*  ALT 721* 601*  ALKPHOS 136* 114  BILITOT 2.5* 5.4*  PROT 5.8* 4.9*  ALBUMIN 3.0* 2.7*   No results for input(s): LIPASE, AMYLASE in the last 72 hours. Cardiac Enzymes  Recent Labs  04/26/2015 1630 04/26/2015 0550  CKTOTAL 372*  --   CKMB 11.2*  --   TROPONINI 0.06*  0.10*    BNP: BNP (last 3 results)  Recent Labs  04/16/2015 2338  BNP 612.8*    ProBNP (last 3 results) No results for input(s): PROBNP in the last 8760 hours.   D-Dimer No results for input(s): DDIMER in the last 72 hours. Hemoglobin A1C  Recent Labs  04/07/2015 0550  HGBA1C 6.6*   Fasting Lipid Panel  Recent Labs  04/26/15 0400  CHOL 101  HDL 26*  LDLCALC 65  TRIG 52  CHOLHDL 3.9   Thyroid Function Tests  Recent Labs  04/07/2015 1630  TSH 6.00*    Other results:     Imaging/Studies:  Dg Chest 2 View  04/10/2015  CLINICAL DATA:  Shortness of breath, decreased oxygen saturation, atrial fibrillation. Former smoker. EXAM: CHEST  2 VIEW COMPARISON:  None in PACs  FINDINGS: The lungs are adequately inflated. There small bilateral pleural effusions. The pulmonary interstitial markings are increased. The heart is top-normal in size. The central pulmonary vascularity is prominent. There is mild loss of height of the body of approximately T8. IMPRESSION: Small bilateral pleural effusions with mild interstitial prominence suggests low-grade CHF. Chronic interstitial prominence could also be secondary to the patient's smoking history or to early interstitial pneumonia. Without previous studies it is difficult to be more precise. Electronically Signed   By: David  Martinique M.D.   On: 04/09/2015 16:56   Dg Chest Port 1 View  03/28/2015  CLINICAL DATA:  Intubated EXAM: PORTABLE CHEST 1 VIEW COMPARISON:  Chest radiograph from earlier today. FINDINGS: Right internal jugular Swan-Ganz catheter terminates over the distal right pulmonary artery. Inferior approach Impella device terminates over the left ventricle. Endotracheal tube tip is 8 cm above the carina at the thoracic inlet. Stable cardiomediastinal silhouette with top-normal heart size. No pneumothorax. No pleural effusion. Mild pulmonary edema appears slightly decreased. IMPRESSION: 1. Endotracheal tube tip is 8 cm above the carina at the thoracic inlet, consider advancing 2-3 cm. 2. Mild pulmonary edema, slightly decreased. Electronically Signed   By: Ilona Sorrel M.D.   On: 04/01/2015 19:31   Dg Chest Port 1 View  04/21/2015  CLINICAL DATA:  Swan-Ganz catheter placement, status post right heart catheterization EXAM: PORTABLE CHEST 1 VIEW COMPARISON:  04/12/2015 FINDINGS: Borderline cardiomegaly again noted. Elevation of the left hemidiaphragm again noted. Stable small right pleural effusion with bibasilar atelectasis. There is right IJ Swan-Ganz catheter with tip in the region of right main pulmonary artery. No pneumothorax. IMPRESSION: Right IJ Swan-Ganz catheter with tip in the region of right main pulmonary artery.  Persistent small right pleural effusion with right basilar atelectasis. No pneumothorax. Electronically Signed   By: Lahoma Crocker M.D.   On: 04/13/2015 15:30   Dg Chest Portable 1 View  04/04/2015  CLINICAL DATA:  Central line placement.  Initial encounter. EXAM: PORTABLE CHEST 1 VIEW COMPARISON:  Chest radiograph 03/27/2015 FINDINGS: The patient's right IJ line is seen ending about the distal SVC. A small right pleural effusion is noted, with associated right basilar airspace opacity likely reflecting atelectasis. There is mild elevation of the left hemidiaphragm. No pneumothorax is seen. The cardiomediastinal silhouette is borderline enlarged. No acute osseous abnormalities are identified. IMPRESSION: 1. Right IJ line noted ending about the distal SVC. 2. Small right pleural effusion, with associated right basilar airspace opacity likely reflecting atelectasis. Mild elevation of the left hemidiaphragm. 3. Borderline cardiomegaly. Electronically Signed   By: Garald Balding M.D.   On: 04/27/2015 05:51   Dg Chest Port 1 View  04/23/2015  CLINICAL DATA:  Acute onset of hypotension and hypothermia. Atrial fibrillation. Initial encounter. EXAM: PORTABLE CHEST 1 VIEW COMPARISON:  Chest radiograph performed earlier today at 4:58 p.m. FINDINGS: The lungs are well-aerated. Mild bibasilar opacities likely reflect atelectasis. There is no evidence of pleural effusion or pneumothorax. The cardiomediastinal silhouette is borderline normal in size. No acute osseous abnormalities are seen. IMPRESSION: Mild bibasilar airspace opacities likely reflect atelectasis. Lungs otherwise clear. Electronically Signed   By: Garald Balding M.D.   On: 04/07/2015 23:51     Latest Echo  Latest Cath   Medications:     Scheduled Medications: . antiseptic oral rinse  7 mL Mouth Rinse 10 times per day  . chlorhexidine gluconate (SAGE KIT)  15 mL Mouth Rinse BID  . furosemide  80 mg Intravenous 3 times per day  . insulin aspart   0-15 Units Subcutaneous 6 times per day  . pantoprazole (PROTONIX) IV  40 mg Intravenous Q24H  . sodium chloride flush  3 mL Intravenous Q12H  . sodium chloride flush  3 mL Intravenous Q12H     Infusions: . sodium chloride Stopped (04/21/2015 1041)  . amiodarone 30 mg/hr (04/26/15 0304)  . fentaNYL infusion INTRAVENOUS 150 mcg/hr (04/26/15 0624)  . impella catheter heparin 50 unit/mL in dextrose 5%    . heparin 1,200 Units/hr (04/26/15 7517)  . milrinone Stopped (03/29/2015 2254)  . norepinephrine (LEVOPHED) Adult infusion 35 mcg/min (04/26/15 0600)  . propofol (DIPRIVAN) infusion 10 mcg/kg/min (04/19/2015 2051)     PRN Medications:  sodium chloride, sodium chloride, sodium chloride, acetaminophen, midazolam, ondansetron (ZOFRAN) IV, sodium chloride flush, sodium chloride flush   Assessment/Plan   Wesley Tyler is a 65 y.o. male sent to [facility] after PCP labs came back abnormal with mildly elevated troponin. Was found to be in florid HF and transferred to [facility] for further work up. He has no known medical history, as he has not seen an MD in > 40 years.  1. Acute systolic CHF/cardiogenic shock: Steadily worsening dyspnea over the last month, no clear trigger. Markedly worse 3/28, came to ER. EF 10% on bedside echo. Noted to be in atrial fibrillation and hypotensive, started initially on phenylephrine but transitioned over to dobutamine. No chest pain, slight troponin elevation is likely explained by cardiogenic shock/volume overload. Ddx for cardiomyopathy = coronary disease (?chronic) versus myocarditis versus tachy-mediated with atrial fibrillation of uncertain duration. Required Impella placement with low cardiac output on 3/29 (CI 1.3 by thermodilution on dobutamine).  He decompensated and was intubated, dobutamine transitioned to milrinone and norepinephrine eventually added with stabilization.  This am, CI 2.76 with co-ox 76%.  PCWP 16.  Poor urine output and creatinine rising.  -  Continue milrinone 0.375 mcg/kg/min and 35 of levophed. Coox 77% this am.  Good BP, should be able to titrate down norepinephrine.  - Hold lasix with PCWP 16 this morning in setting of rising creatinine. Will reassess in afternoon. - Continue Impella support at P2.  Good waveform, no alarms.  - If he recovers, will eventually need coronary angiography.  2. Acute hypoxic respiratory failure - Remains intubated. Settings per CCM 3. AKI: Suspect ATN, cardiorenal/related to low output HF and hypotension. Poor urine output currently, concerned that he is going to require CVVH.  - Renal consult.  4. Atrial fibrillation: On heparin and amiodarone gtt for rate control.  Uncertain duration of atrial fibrillation, cannot rule out tachy-mediated CMP.  5. Endo: Elevated TSH and mildly elevated free T4. ?Sick euthyroid. Will follow.  6. Elevated LFTs: Suspect shock liver.  LFTs rose prior to amiodarone initiation.    Length of Stay: 1  Shirley Friar PA-C 04/26/2015, 7:19 AM  Advanced Heart Failure Team Pager [phone] (M-F; 7a - 4p)  Please contact [facility] for night-coverage after hours (4p -7a ) and weekends on [email or web address]  Patient seen with PA, agree with the above note.  He remains critically ill, intubated on Impella support with milrinone and norepinephrine.  Suspect ATN in setting of cardiogenic shock with rising creatinine.   Good cardiac output this morning.  Can wean norepinephrine as BP tolerates. Continue current Impella support and milrinone.  Hold Lasix with rising creatinine and PCWP 16, reassess in afternoon.    Suspect he will need CVVH. Renal to see this morning.   40 minutes critical care time.   Loralie Champagne 04/26/2015 7:49 AM

## 2015-04-26 NOTE — Consult Note (Signed)
Reason for Consult:AKI Referring Physician: Dr. Reche Dixon Tyler is an 65 y.o. male.  HPI: 65 yr male recently moved here from [place]., apparently some edema there.  2 d ago because of swelling and DOB ,counsin convinced to go to Urgent Care.  Had Afib and given anticoag and meds to control rate.  Call back and told to go to ED because of labs with ^BNP, Trop.  In ED,low bps, resp issues.  Admitted and given inotropes but progressed with low bp , resp distress, so was entub, put on pressors, impellor placed.  Cr on admit 1.7 3/28, progressed to 2.8 on 3/28 and 3.56 today.  Bicarb 17.  Has ^ bili of 5.4, SGOT 1472, SGPT 601.Marland Kitchen  Discussed with cousin (nearest relative).  No known PMH.  No index of past renal function or meds. Review of systems not obtained due to patient factors. History reviewed. No pertinent past medical history.  Past Surgical History  Procedure Laterality Date  . Wisdom tooth extraction    . Cardiac catheterization N/A 04/15/2015    Procedure: Right Heart Cath;  Surgeon: Larey Dresser, MD;  Location: [place] [facility];  Service: Cardiovascular;  Laterality: N/A;    Family History  Problem Relation Age of Onset  . Stroke Mother   . ALS Father   . Heart attack Maternal Aunt   . Hypertension Maternal Aunt     Social History:  reports that he has quit smoking. He has never used smokeless tobacco. He reports that he drinks about 4.2 - 8.4 oz of alcohol per week. He reports that he does not use illicit drugs.  Allergies: No Known Allergies  Medications:  I have reviewed the patient's current medications. Prior to Admission:  Prescriptions prior to admission  Medication Sig Dispense Refill Last Dose  . apixaban (ELIQUIS) 5 MG TABS tablet Take 1 tablet (5 mg total) by mouth 2 (two) times daily. 60 tablet 1 04/14/2015 at 1900  . metoprolol (LOPRESSOR) 50 MG tablet Take 1 tablet (50 mg total) by mouth 2 (two) times daily. 60 tablet 1 03/28/2015 at 1900  . Multiple  Vitamins-Minerals (CENTRUM ADULTS PO) Take 1 tablet by mouth daily.   Past Week at Unknown time  . Multiple Vitamins-Minerals (EMERGEN-C IMMUNE PO) Take 1 each by mouth daily.   Past Week at Unknown time      Results for orders placed or performed during the hospital encounter of 04/15/2015 (from the past 48 hour(s))  Basic metabolic panel     Status: Abnormal   Collection Time: 03/30/2015 10:45 PM  Result Value Ref Range   Sodium 134 (L) 135 - 145 mmol/L   Potassium 5.1 3.5 - 5.1 mmol/L   Chloride 101 101 - 111 mmol/L   CO2 16 (L) 22 - 32 mmol/L   Glucose, Bld 229 (H) 65 - 99 mg/dL   BUN 42 (H) 6 - 20 mg/dL   Creatinine, Ser 2.21 (H) 0.61 - 1.24 mg/dL   Calcium 9.2 8.9 - 10.3 mg/dL   GFR calc non Af Amer 30 (L) >60 mL/min   GFR calc Af Amer 34 (L) >60 mL/min    Comment: (NOTE) The eGFR has been calculated using the CKD EPI equation. This calculation has not been validated in all clinical situations. eGFR's persistently <60 mL/min signify possible Chronic Kidney Disease.    Anion gap 17 (H) 5 - 15  CBC     Status: Abnormal   Collection Time: 04/07/2015 10:45 PM  Result  Value Ref Range   WBC 12.0 (H) 4.0 - 10.5 K/uL   RBC 5.67 4.22 - 5.81 MIL/uL   Hemoglobin 18.7 (H) 13.0 - 17.0 g/dL   HCT 52.6 (H) 39.0 - 52.0 %   MCV 92.8 78.0 - 100.0 fL   MCH 33.0 26.0 - 34.0 pg   MCHC 35.6 30.0 - 36.0 g/dL   RDW 13.8 11.5 - 15.5 %   Platelets 260 150 - 400 K/uL  I-stat troponin, ED (not at [facility])     Status: None   Collection Time: 04/16/2015 10:54 PM  Result Value Ref Range   Troponin i, poc 0.08 0.00 - 0.08 ng/mL   Comment 3            Comment: Due to the release kinetics of cTnI, a negative result within the first hours of the onset of symptoms does not rule out myocardial infarction with certainty. If myocardial infarction is still suspected, repeat the test at appropriate intervals.   Hepatic function panel     Status: Abnormal   Collection Time: 03/29/2015 11:38 PM  Result Value  Ref Range   Total Protein 7.9 6.5 - 8.1 g/dL   Albumin 4.0 3.5 - 5.0 g/dL   AST 189 (H) 15 - 41 U/L   ALT 151 (H) 17 - 63 U/L   Alkaline Phosphatase 186 (H) 38 - 126 U/L   Total Bilirubin 3.9 (H) 0.3 - 1.2 mg/dL   Bilirubin, Direct 1.4 (H) 0.1 - 0.5 mg/dL   Indirect Bilirubin 2.5 (H) 0.3 - 0.9 mg/dL  Brain natriuretic peptide     Status: Abnormal   Collection Time: 04/26/2015 11:38 PM  Result Value Ref Range   B Natriuretic Peptide 612.8 (H) 0.0 - 100.0 pg/mL  Urinalysis, Routine w reflex microscopic (not at [facility])     Status: Abnormal   Collection Time: 04/15/2015 12:24 AM  Result Value Ref Range   Color, Urine AMBER (A) YELLOW    Comment: BIOCHEMICALS MAY BE AFFECTED BY COLOR   APPearance CLOUDY (A) CLEAR   Specific Gravity, Urine 1.021 1.005 - 1.030   pH 5.0 5.0 - 8.0   Glucose, UA NEGATIVE NEGATIVE mg/dL   Hgb urine dipstick LARGE (A) NEGATIVE   Bilirubin Urine MODERATE (A) NEGATIVE   Ketones, ur NEGATIVE NEGATIVE mg/dL   Protein, ur 100 (A) NEGATIVE mg/dL   Nitrite NEGATIVE NEGATIVE   Leukocytes, UA SMALL (A) NEGATIVE  Urine microscopic-add on     Status: Abnormal   Collection Time: 04/07/2015 12:24 AM  Result Value Ref Range   Squamous Epithelial / LPF 0-5 (A) NONE SEEN   WBC, UA 6-30 0 - 5 WBC/hpf   RBC / HPF TOO NUMEROUS TO COUNT 0 - 5 RBC/hpf   Bacteria, UA MANY (A) NONE SEEN   Casts HYALINE CASTS (A) NEGATIVE  Blood gas, arterial ([facility] & [facility] ONLY)     Status: Abnormal   Collection Time: 04/03/2015  1:46 AM  Result Value Ref Range   FIO2 1.00    O2 Content 15.0 L/min   Delivery systems NON-REBREATHER OXYGEN MASK    pH, Arterial 7.459 (H) 7.350 - 7.450   pCO2 arterial 17.1 (LL) 35.0 - 45.0 mmHg    Comment: CRITICAL RESULT CALLED TO, READ BACK BY AND VERIFIED WITH: Riyansh Berkshire, MD AT [phone] ON 04/16/2015 BY DANIEL JONES, RRT, RCP    pO2, Arterial 457 (H) 80.0 - 100.0 mmHg   Bicarbonate 12.1 (L) 20.0 - 24.0 mEq/L   TCO2 10.0  0 - 100 mmol/L   Acid-base deficit 8.9 (H)  0.0 - 2.0 mmol/L   O2 Saturation 99.8 %   Patient temperature 97.5    Drawn by LEFT RADIAL    Sample type ARTERIAL DRAW    Allens test (pass/fail) PASS PASS  I-Stat CG4 Lactic Acid, ED     Status: Abnormal   Collection Time: 04/19/2015  1:46 AM  Result Value Ref Range   Lactic Acid, Venous 4.32 (HH) 0.5 - 2.0 mmol/L   Comment NOTIFIED PHYSICIAN   I-Stat CG4 Lactic Acid, ED     Status: Abnormal   Collection Time: 04/23/2015  3:57 AM  Result Value Ref Range   Lactic Acid, Venous 4.53 (HH) 0.5 - 2.0 mmol/L   Comment NOTIFIED PHYSICIAN   T4, free     Status: Abnormal   Collection Time: 03/29/2015  4:47 AM  Result Value Ref Range   Free T4 1.17 (H) 0.61 - 1.12 ng/dL    Comment: Performed at [facility]  Troponin I     Status: Abnormal   Collection Time: 04/17/2015  5:50 AM  Result Value Ref Range   Troponin I 0.10 (H) <0.031 ng/mL    Comment:        PERSISTENTLY INCREASED TROPONIN VALUES IN THE RANGE OF 0.04-0.49 ng/mL CAN BE SEEN IN:       -UNSTABLE ANGINA       -CONGESTIVE HEART FAILURE       -MYOCARDITIS       -CHEST TRAUMA       -ARRYHTHMIAS       -LATE PRESENTING MYOCARDIAL INFARCTION       -COPD   CLINICAL FOLLOW-UP RECOMMENDED.   Lactic acid, plasma     Status: Abnormal   Collection Time: 04/21/2015  5:50 AM  Result Value Ref Range   Lactic Acid, Venous 2.7 (HH) 0.5 - 2.0 mmol/L    Comment: CRITICAL RESULT CALLED TO, READ BACK BY AND VERIFIED WITH: NEILSON,T. RN AT [phone] 04/16/2015 MULLINS,T   Procalcitonin     Status: None   Collection Time: 04/10/2015  5:50 AM  Result Value Ref Range   Procalcitonin 0.16 ng/mL    Comment:        Interpretation: PCT (Procalcitonin) <= 0.5 ng/mL: Systemic infection (sepsis) is not likely. Local bacterial infection is possible. (NOTE)         ICU PCT Algorithm               Non ICU PCT Algorithm    ----------------------------     ------------------------------         PCT < 0.25 ng/mL                 PCT < 0.1 ng/mL     Stopping  of antibiotics            Stopping of antibiotics       strongly encouraged.               strongly encouraged.    ----------------------------     ------------------------------       PCT level decrease by               PCT < 0.25 ng/mL       >= 80% from peak PCT       OR PCT 0.25 - 0.5 ng/mL          Stopping of antibiotics  encouraged.     Stopping of antibiotics           encouraged.    ----------------------------     ------------------------------       PCT level decrease by              PCT >= 0.25 ng/mL       < 80% from peak PCT        AND PCT >= 0.5 ng/mL            Continuin g antibiotics                                              encouraged.       Continuing antibiotics            encouraged.    ----------------------------     ------------------------------     PCT level increase compared          PCT > 0.5 ng/mL         with peak PCT AND          PCT >= 0.5 ng/mL             Escalation of antibiotics                                          strongly encouraged.      Escalation of antibiotics        strongly encouraged.   Hemoglobin A1c     Status: Abnormal   Collection Time: 04/10/2015  5:50 AM  Result Value Ref Range   Hgb A1c MFr Bld 6.6 (H) 4.8 - 5.6 %    Comment: (NOTE)         Pre-diabetes: 5.7 - 6.4         Diabetes: >6.4         Glycemic control for adults with diabetes: <7.0    Mean Plasma Glucose 143 mg/dL    Comment: (NOTE) Performed At: [facility] [place], [place] [id number] Lindon Romp MD AC:[id number]   CBG monitoring, ED     Status: Abnormal   Collection Time: 04/20/2015  6:03 AM  Result Value Ref Range   Glucose-Capillary 146 (H) 65 - 99 mg/dL  CBG monitoring, ED     Status: Abnormal   Collection Time: 04/03/2015  8:58 AM  Result Value Ref Range   Glucose-Capillary 149 (H) 65 - 99 mg/dL  MRSA PCR Screening     Status: None   Collection Time: 03/29/2015 10:26 AM  Result  Value Ref Range   MRSA by PCR NEGATIVE NEGATIVE    Comment:        The GeneXpert MRSA Assay (FDA approved for NASAL specimens only), is one component of a comprehensive MRSA colonization surveillance program. It is not intended to diagnose MRSA infection nor to guide or monitor treatment for MRSA infections.   Carboxyhemoglobin     Status: None   Collection Time: 04/04/2015 10:30 AM  Result Value Ref Range   Total hemoglobin 17.4 13.5 - 18.0 g/dL   O2 Saturation 35.7 %   Carboxyhemoglobin 0.7 0.5 - 1.5 %   Methemoglobin 0.8 0.0 - 1.5 %  Comprehensive metabolic panel     Status: Abnormal  Collection Time: 04/05/2015 10:51 AM  Result Value Ref Range   Sodium 136 135 - 145 mmol/L   Potassium 5.2 (H) 3.5 - 5.1 mmol/L   Chloride 104 101 - 111 mmol/L   CO2 19 (L) 22 - 32 mmol/L   Glucose, Bld 142 (H) 65 - 99 mg/dL   BUN 50 (H) 6 - 20 mg/dL   Creatinine, Ser 2.81 (H) 0.61 - 1.24 mg/dL   Calcium 9.0 8.9 - 10.3 mg/dL   Total Protein 6.3 (L) 6.5 - 8.1 g/dL   Albumin 3.3 (L) 3.5 - 5.0 g/dL   AST 1012 (H) 15 - 41 U/L   ALT 765 (H) 17 - 63 U/L   Alkaline Phosphatase 156 (H) 38 - 126 U/L   Total Bilirubin 2.4 (H) 0.3 - 1.2 mg/dL   GFR calc non Af Amer 22 (L) >60 mL/min   GFR calc Af Amer 26 (L) >60 mL/min    Comment: (NOTE) The eGFR has been calculated using the CKD EPI equation. This calculation has not been validated in all clinical situations. eGFR's persistently <60 mL/min signify possible Chronic Kidney Disease.    Anion gap 13 5 - 15  CBC with Differential/Platelet     Status: Abnormal   Collection Time: 04/14/2015 10:51 AM  Result Value Ref Range   WBC 16.1 (H) 4.0 - 10.5 K/uL   RBC 5.28 4.22 - 5.81 MIL/uL   Hemoglobin 17.2 (H) 13.0 - 17.0 g/dL   HCT 49.4 39.0 - 52.0 %   MCV 93.6 78.0 - 100.0 fL   MCH 32.6 26.0 - 34.0 pg   MCHC 34.8 30.0 - 36.0 g/dL   RDW 13.8 11.5 - 15.5 %   Platelets 262 150 - 400 K/uL   Neutrophils Relative % 82 %   Neutro Abs 13.1 (H) 1.7 - 7.7 K/uL    Lymphocytes Relative 7 %   Lymphs Abs 1.2 0.7 - 4.0 K/uL   Monocytes Relative 11 %   Monocytes Absolute 1.8 (H) 0.1 - 1.0 K/uL   Eosinophils Relative 0 %   Eosinophils Absolute 0.0 0.0 - 0.7 K/uL   Basophils Relative 0 %   Basophils Absolute 0.0 0.0 - 0.1 K/uL  Magnesium     Status: Abnormal   Collection Time: 04/10/2015 10:51 AM  Result Value Ref Range   Magnesium 2.8 (H) 1.7 - 2.4 mg/dL  Phosphorus     Status: Abnormal   Collection Time: 03/29/2015 10:51 AM  Result Value Ref Range   Phosphorus 6.4 (H) 2.5 - 4.6 mg/dL  I-STAT 3, venous blood gas (G3P V)     Status: Abnormal   Collection Time: 04/04/2015 12:11 PM  Result Value Ref Range   pH, Ven 7.295 7.250 - 7.300   pCO2, Ven 41.0 (L) 45.0 - 50.0 mmHg   pO2, Ven 37.0 31.0 - 45.0 mmHg   Bicarbonate 19.9 (L) 20.0 - 24.0 mEq/L   TCO2 21 0 - 100 mmol/L   O2 Saturation 65.0 %   Acid-base deficit 6.0 (H) 0.0 - 2.0 mmol/L   Patient temperature HIDE    Sample type VENOUS   I-STAT 3, venous blood gas (G3P V)     Status: Abnormal   Collection Time: 04/01/2015 12:11 PM  Result Value Ref Range   pH, Ven 7.299 7.250 - 7.300   pCO2, Ven 40.7 (L) 45.0 - 50.0 mmHg   pO2, Ven 36.0 31.0 - 45.0 mmHg   Bicarbonate 20.0 20.0 - 24.0 mEq/L   TCO2 21 0 -  100 mmol/L   O2 Saturation 63.0 %   Acid-base deficit 6.0 (H) 0.0 - 2.0 mmol/L   Patient temperature HIDE    Sample type VENOUS   POCT Activated clotting time     Status: None   Collection Time: 04/13/2015 12:56 PM  Result Value Ref Range   Activated Clotting Time 204 seconds  POCT Activated clotting time     Status: None   Collection Time: 04/14/2015  1:29 PM  Result Value Ref Range   Activated Clotting Time 219 seconds  Comprehensive metabolic panel     Status: Abnormal   Collection Time: 04/12/2015  3:30 PM  Result Value Ref Range   Sodium 136 135 - 145 mmol/L   Potassium 4.6 3.5 - 5.1 mmol/L    Comment: HEMOLYSIS AT THIS LEVEL MAY AFFECT RESULT   Chloride 103 101 - 111 mmol/L   CO2 18 (L)  22 - 32 mmol/L   Glucose, Bld 147 (H) 65 - 99 mg/dL   BUN 55 (H) 6 - 20 mg/dL   Creatinine, Ser 2.93 (H) 0.61 - 1.24 mg/dL   Calcium 8.4 (L) 8.9 - 10.3 mg/dL   Total Protein 5.8 (L) 6.5 - 8.1 g/dL   Albumin 3.0 (L) 3.5 - 5.0 g/dL   AST 1177 (H) 15 - 41 U/L   ALT 721 (H) 17 - 63 U/L   Alkaline Phosphatase 136 (H) 38 - 126 U/L   Total Bilirubin 2.5 (H) 0.3 - 1.2 mg/dL   GFR calc non Af Amer 21 (L) >60 mL/min   GFR calc Af Amer 24 (L) >60 mL/min    Comment: (NOTE) The eGFR has been calculated using the CKD EPI equation. This calculation has not been validated in all clinical situations. eGFR's persistently <60 mL/min signify possible Chronic Kidney Disease.    Anion gap 15 5 - 15  CBC with Differential/Platelet     Status: Abnormal   Collection Time: 04/17/2015  3:30 PM  Result Value Ref Range   WBC 12.6 (H) 4.0 - 10.5 K/uL   RBC 4.81 4.22 - 5.81 MIL/uL   Hemoglobin 15.1 13.0 - 17.0 g/dL   HCT 44.7 39.0 - 52.0 %   MCV 92.9 78.0 - 100.0 fL   MCH 31.4 26.0 - 34.0 pg   MCHC 33.8 30.0 - 36.0 g/dL   RDW 13.4 11.5 - 15.5 %   Platelets 217 150 - 400 K/uL   Neutrophils Relative % 85 %   Neutro Abs 10.6 (H) 1.7 - 7.7 K/uL   Lymphocytes Relative 9 %   Lymphs Abs 1.1 0.7 - 4.0 K/uL   Monocytes Relative 6 %   Monocytes Absolute 0.8 0.1 - 1.0 K/uL   Eosinophils Relative 0 %   Eosinophils Absolute 0.0 0.0 - 0.7 K/uL   Basophils Relative 0 %   Basophils Absolute 0.0 0.0 - 0.1 K/uL  POCT Activated clotting time     Status: None   Collection Time: 04/19/2015  3:38 PM  Result Value Ref Range   Activated Clotting Time 157 seconds  Glucose, capillary     Status: Abnormal   Collection Time: 04/05/2015  4:34 PM  Result Value Ref Range   Glucose-Capillary 133 (H) 65 - 99 mg/dL   Comment 1 Venous Specimen   POCT Activated clotting time     Status: None   Collection Time: 04/14/2015  6:14 PM  Result Value Ref Range   Activated Clotting Time 142 seconds  Carboxyhemoglobin     Status: None  Collection Time: 03/28/2015  6:24 PM  Result Value Ref Range   Total hemoglobin 16.2 13.5 - 18.0 g/dL   O2 Saturation 77.5 %   Carboxyhemoglobin 0.6 0.5 - 1.5 %   Methemoglobin 0.9 0.0 - 1.5 %  POCT Activated clotting time     Status: None   Collection Time: 04/04/2015  7:56 PM  Result Value Ref Range   Activated Clotting Time 137 seconds  I-STAT 3, arterial blood gas (G3+)     Status: Abnormal   Collection Time: 04/04/2015  8:18 PM  Result Value Ref Range   pH, Arterial 7.412 7.350 - 7.450   pCO2 arterial 33.9 (L) 35.0 - 45.0 mmHg   pO2, Arterial 475.0 (H) 80.0 - 100.0 mmHg   Bicarbonate 21.7 20.0 - 24.0 mEq/L   TCO2 23 0 - 100 mmol/L   O2 Saturation 100.0 %   Acid-base deficit 2.0 0.0 - 2.0 mmol/L   Patient temperature 36.6 C    Sample type ARTERIAL   POCT Activated clotting time     Status: None   Collection Time: 04/19/2015  9:16 PM  Result Value Ref Range   Activated Clotting Time 131 seconds  Glucose, capillary     Status: Abnormal   Collection Time: 04/20/2015  9:41 PM  Result Value Ref Range   Glucose-Capillary 162 (H) 65 - 99 mg/dL  POCT Activated clotting time     Status: None   Collection Time: 04/26/2015 10:16 PM  Result Value Ref Range   Activated Clotting Time 132 seconds  Triglycerides     Status: None   Collection Time: 04/10/2015 10:28 PM  Result Value Ref Range   Triglycerides 13 <150 mg/dL  POCT Activated clotting time     Status: None   Collection Time: 04/04/2015 11:11 PM  Result Value Ref Range   Activated Clotting Time 137 seconds  Glucose, capillary     Status: Abnormal   Collection Time: 04/26/15 12:19 AM  Result Value Ref Range   Glucose-Capillary 143 (H) 65 - 99 mg/dL   Comment 1 Arterial Specimen   POCT Activated clotting time     Status: None   Collection Time: 04/26/15 12:21 AM  Result Value Ref Range   Activated Clotting Time 137 seconds  POCT Activated clotting time     Status: None   Collection Time: 04/26/15  1:17 AM  Result Value Ref Range    Activated Clotting Time 131 seconds  POCT Activated clotting time     Status: None   Collection Time: 04/26/15  2:18 AM  Result Value Ref Range   Activated Clotting Time 137 seconds  POCT Activated clotting time     Status: None   Collection Time: 04/26/15  3:14 AM  Result Value Ref Range   Activated Clotting Time 136 seconds  CBC     Status: Abnormal   Collection Time: 04/26/15  4:00 AM  Result Value Ref Range   WBC 17.8 (H) 4.0 - 10.5 K/uL   RBC 4.54 4.22 - 5.81 MIL/uL   Hemoglobin 14.2 13.0 - 17.0 g/dL   HCT 40.9 39.0 - 52.0 %   MCV 90.1 78.0 - 100.0 fL   MCH 31.3 26.0 - 34.0 pg   MCHC 34.7 30.0 - 36.0 g/dL   RDW 13.1 11.5 - 15.5 %   Platelets 207 150 - 400 K/uL  Lipid panel     Status: Abnormal   Collection Time: 04/26/15  4:00 AM  Result Value Ref Range   Cholesterol 101  0 - 200 mg/dL   Triglycerides 52 <150 mg/dL   HDL 26 (L) >40 mg/dL   Total CHOL/HDL Ratio 3.9 RATIO   VLDL 10 0 - 40 mg/dL   LDL Cholesterol 65 0 - 99 mg/dL    Comment:        Total Cholesterol/HDL:CHD Risk Coronary Heart Disease Risk Table                     Men   Women  1/2 Average Risk   3.4   3.3  Average Risk       5.0   4.4  2 X Average Risk   9.6   7.1  3 X Average Risk  23.4   11.0        Use the calculated Patient Ratio above and the CHD Risk Table to determine the patient's CHD Risk.        ATP III CLASSIFICATION (LDL):  <100     mg/dL   Optimal  100-129  mg/dL   Near or Above                    Optimal  130-159  mg/dL   Borderline  160-189  mg/dL   High  >190     mg/dL   Very High   Comprehensive metabolic panel     Status: Abnormal   Collection Time: 04/26/15  4:00 AM  Result Value Ref Range   Sodium 134 (L) 135 - 145 mmol/L   Potassium 4.0 3.5 - 5.1 mmol/L   Chloride 104 101 - 111 mmol/L   CO2 17 (L) 22 - 32 mmol/L   Glucose, Bld 117 (H) 65 - 99 mg/dL   BUN 69 (H) 6 - 20 mg/dL   Creatinine, Ser 3.56 (H) 0.61 - 1.24 mg/dL   Calcium 8.1 (L) 8.9 - 10.3 mg/dL   Total  Protein 4.9 (L) 6.5 - 8.1 g/dL   Albumin 2.7 (L) 3.5 - 5.0 g/dL   AST 1472 (H) 15 - 41 U/L   ALT 601 (H) 17 - 63 U/L   Alkaline Phosphatase 114 38 - 126 U/L   Total Bilirubin 5.4 (H) 0.3 - 1.2 mg/dL   GFR calc non Af Amer 17 (L) >60 mL/min   GFR calc Af Amer 19 (L) >60 mL/min    Comment: (NOTE) The eGFR has been calculated using the CKD EPI equation. This calculation has not been validated in all clinical situations. eGFR's persistently <60 mL/min signify possible Chronic Kidney Disease.    Anion gap 13 5 - 15  Magnesium     Status: Abnormal   Collection Time: 04/26/15  4:00 AM  Result Value Ref Range   Magnesium 2.5 (H) 1.7 - 2.4 mg/dL  Phosphorus     Status: Abnormal   Collection Time: 04/26/15  4:00 AM  Result Value Ref Range   Phosphorus 5.1 (H) 2.5 - 4.6 mg/dL  Lactate dehydrogenase     Status: Abnormal   Collection Time: 04/26/15  4:00 AM  Result Value Ref Range   LDH 2267 (H) 98 - 192 U/L  I-STAT 3, arterial blood gas (G3+)     Status: Abnormal   Collection Time: 04/26/15  4:05 AM  Result Value Ref Range   pH, Arterial 7.453 (H) 7.350 - 7.450   pCO2 arterial 33.2 (L) 35.0 - 45.0 mmHg   pO2, Arterial 201.0 (H) 80.0 - 100.0 mmHg   Bicarbonate 23.3 20.0 - 24.0 mEq/L  TCO2 24 0 - 100 mmol/L   O2 Saturation 100.0 %   Patient temperature 37.0 C    Sample type ARTERIAL   POCT Activated clotting time     Status: None   Collection Time: 04/26/15  4:10 AM  Result Value Ref Range   Activated Clotting Time 142 seconds  Glucose, capillary     Status: Abnormal   Collection Time: 04/26/15  4:11 AM  Result Value Ref Range   Glucose-Capillary 117 (H) 65 - 99 mg/dL   Comment 1 Arterial Specimen   Carboxyhemoglobin     Status: None   Collection Time: 04/26/15  4:50 AM  Result Value Ref Range   Total hemoglobin 14.1 13.5 - 18.0 g/dL   O2 Saturation 76.9 %   Carboxyhemoglobin 1.1 0.5 - 1.5 %   Methemoglobin 1.3 0.0 - 1.5 %  POCT Activated clotting time     Status: None    Collection Time: 04/26/15  5:13 AM  Result Value Ref Range   Activated Clotting Time 136 seconds  POCT Activated clotting time     Status: None   Collection Time: 04/26/15  6:08 AM  Result Value Ref Range   Activated Clotting Time 142 seconds  POCT Activated clotting time     Status: None   Collection Time: 04/26/15  7:02 AM  Result Value Ref Range   Activated Clotting Time 147 seconds  POCT Activated clotting time     Status: None   Collection Time: 04/26/15  7:53 AM  Result Value Ref Range   Activated Clotting Time 147 seconds  Glucose, capillary     Status: Abnormal   Collection Time: 04/26/15  7:57 AM  Result Value Ref Range   Glucose-Capillary 114 (H) 65 - 99 mg/dL  POCT Activated clotting time     Status: None   Collection Time: 04/26/15  9:06 AM  Result Value Ref Range   Activated Clotting Time 147 seconds  POCT Activated clotting time     Status: None   Collection Time: 04/26/15 10:26 AM  Result Value Ref Range   Activated Clotting Time 147 seconds    Dg Chest 2 View  04/13/2015  CLINICAL DATA:  Shortness of breath, decreased oxygen saturation, atrial fibrillation. Former smoker. EXAM: CHEST  2 VIEW COMPARISON:  None in PACs FINDINGS: The lungs are adequately inflated. There small bilateral pleural effusions. The pulmonary interstitial markings are increased. The heart is top-normal in size. The central pulmonary vascularity is prominent. There is mild loss of height of the body of approximately T8. IMPRESSION: Small bilateral pleural effusions with mild interstitial prominence suggests low-grade CHF. Chronic interstitial prominence could also be secondary to the patient's smoking history or to early interstitial pneumonia. Without previous studies it is difficult to be more precise. Electronically Signed   By: David  Martinique M.D.   On: 04/13/2015 16:56   Dg Chest Port 1 View  04/26/2015  CLINICAL DATA:  66 year old male with respiratory failure. Cardiogenic shock. Initial  encounter. EXAM: PORTABLE CHEST 1 VIEW COMPARISON:  04/01/2015 and earlier. FINDINGS: Portable AP semi upright view at 0434 hours. Endotracheal tube tip in good position just below the clavicles. Stable right IJ approach Swan-Ganz catheter, tip at the right lower lobe pulmonary artery at the hilum. Impella device re- demonstrated over the cardiac apex. Pacer or resuscitation pads in place. Multiple EKG leads and wires overlie the chest. Stable cardiac size and mediastinal contours. Stable lung volumes in ventilation. Mild veiling opacity at both lung bases. Stable  pulmonary vascularity without overt edema. No pneumothorax. No air bronchograms identified. IMPRESSION: 1.  Stable lines and tubes. 2. Stable ventilation with suspected bilateral pleural effusions, no overt edema. Electronically Signed   By: Genevie Ann M.D.   On: 04/26/2015 08:29   Dg Chest Port 1 View  04/09/2015  CLINICAL DATA:  Intubated EXAM: PORTABLE CHEST 1 VIEW COMPARISON:  Chest radiograph from earlier today. FINDINGS: Right internal jugular Swan-Ganz catheter terminates over the distal right pulmonary artery. Inferior approach Impella device terminates over the left ventricle. Endotracheal tube tip is 8 cm above the carina at the thoracic inlet. Stable cardiomediastinal silhouette with top-normal heart size. No pneumothorax. No pleural effusion. Mild pulmonary edema appears slightly decreased. IMPRESSION: 1. Endotracheal tube tip is 8 cm above the carina at the thoracic inlet, consider advancing 2-3 cm. 2. Mild pulmonary edema, slightly decreased. Electronically Signed   By: Ilona Sorrel M.D.   On: 04/01/2015 19:31   Dg Chest Port 1 View  04/27/2015  CLINICAL DATA:  Swan-Ganz catheter placement, status post right heart catheterization EXAM: PORTABLE CHEST 1 VIEW COMPARISON:  04/01/2015 FINDINGS: Borderline cardiomegaly again noted. Elevation of the left hemidiaphragm again noted. Stable small right pleural effusion with bibasilar atelectasis.  There is right IJ Swan-Ganz catheter with tip in the region of right main pulmonary artery. No pneumothorax. IMPRESSION: Right IJ Swan-Ganz catheter with tip in the region of right main pulmonary artery. Persistent small right pleural effusion with right basilar atelectasis. No pneumothorax. Electronically Signed   By: Lahoma Crocker M.D.   On: 04/18/2015 15:30   Dg Chest Portable 1 View  04/01/2015  CLINICAL DATA:  Central line placement.  Initial encounter. EXAM: PORTABLE CHEST 1 VIEW COMPARISON:  Chest radiograph 03/27/2015 FINDINGS: The patient's right IJ line is seen ending about the distal SVC. A small right pleural effusion is noted, with associated right basilar airspace opacity likely reflecting atelectasis. There is mild elevation of the left hemidiaphragm. No pneumothorax is seen. The cardiomediastinal silhouette is borderline enlarged. No acute osseous abnormalities are identified. IMPRESSION: 1. Right IJ line noted ending about the distal SVC. 2. Small right pleural effusion, with associated right basilar airspace opacity likely reflecting atelectasis. Mild elevation of the left hemidiaphragm. 3. Borderline cardiomegaly. Electronically Signed   By: Garald Balding M.D.   On: 04/17/2015 05:51   Dg Chest Port 1 View  04/17/2015  CLINICAL DATA:  Acute onset of hypotension and hypothermia. Atrial fibrillation. Initial encounter. EXAM: PORTABLE CHEST 1 VIEW COMPARISON:  Chest radiograph performed earlier today at 4:58 p.m. FINDINGS: The lungs are well-aerated. Mild bibasilar opacities likely reflect atelectasis. There is no evidence of pleural effusion or pneumothorax. The cardiomediastinal silhouette is borderline normal in size. No acute osseous abnormalities are seen. IMPRESSION: Mild bibasilar airspace opacities likely reflect atelectasis. Lungs otherwise clear. Electronically Signed   By: Garald Balding M.D.   On: 03/28/2015 23:51    ROS Blood pressure 104/75, pulse 115, temperature 99.9 F (37.7  C), temperature source Core (Comment), resp. rate 18, height 6' 4"  (1.93 m), weight 96.3 kg (212 lb 4.9 oz), SpO2 96 %. Physical Exam Physical Examination: General appearance - pale,entub Mental status - opens eyes but no coop Eyes - pupils equal and reactive, extraocular eye movements intact Mouth - mucous membranes moist, pharynx normal without lesions Neck - adenopathy noted PCL Lymphatics - posterior cervical nodes Chest - rales noted bilat, rhonchi noted bilat Heart - impella heard,  Abdomen - no bs, distended, liver down 6 cm  Extremities - pedal edema 3-4 +, blue toes,no DP Skin - blue distally  Assessment/Plan: 1 AKI most likely all hemodynamic with cardiac etiology.  Cause is unclear.  Does have pyuria.  Need to control solute/acid/base. Not candidate for fluid of with low bps, pressors and adequate O2 at this time 2 Systolic heart failure with EF 5-10%  On max support 3 Schock liver 4. Massive fluid retention assumption this is all cardiac  P CRRT, fem cath, check U/S, urine chem  Wesley Tyler L 04/26/2015, 12:57 PM      ,

## 2015-04-26 NOTE — Progress Notes (Signed)
  Patient with progressive hypotension despite max dose norepi, vasopressin and IVF. Started epinephrine drip.   Evidence of hemolysis. Catheter repositioned and pump speed turned down to 7 to try and reduce.   Now febrile. Cultures drawn. Started vanc and zosyn.  Trialysis catheter placed for CVVHD.   Discussed with CCM and Nephrology.  Family updated on condition and poor prognosis.   The patient is critically ill with multiple organ systems failure and requires high complexity decision making for assessment and support, frequent evaluation and titration of therapies, application of advanced monitoring technologies and extensive interpretation of multiple databases.   Critical Care Time devoted to patient care services described in this note is 35 Minutes.

## 2015-04-26 NOTE — Progress Notes (Signed)
RN unable to place OG tube, CCMD aware. CCMD suggested to place cortrak in the morning. Will continue to monitor pt.

## 2015-04-26 NOTE — Progress Notes (Signed)
Initial Nutrition Assessment  DOCUMENTATION CODES:   Not applicable  INTERVENTION:  -Recommend initiate Enteral Feeds via Cortrak tube (when placed) with Vital 1.5 @ 120mL/hr increase by 10 every 4 hours to goal rate of 9455mL/hr -Pro-Stat 30mL TID, each supplement provides 100 calories and 15g protein -Tubefeeding regimen  In addition to propofol provides 2376 calories (106% of needs), 134g protein, 993cc free water  NUTRITION DIAGNOSIS:   Inadequate oral intake related to inability to eat as evidenced by energy intake < or equal to 50% for > or equal to 5 days.  GOAL:   Patient will meet greater than or equal to 90% of their needs  MONITOR:   Labs, I & O's, Skin, Vent status, Weight trends  REASON FOR ASSESSMENT:   Ventilator    ASSESSMENT:   65 y.o. M with unknown PMH, [facility] after he was found to have A.fib with RVR at urgent care earlier in the day. In ED, he was found to have AKI, AGMA, transaminitis, and hypothyroidism. PCCM was asked to admit.  Patient is currently intubated on ventilator support MV: 11 L/min Temp (24hrs), Avg:98.8 F (37.1 C), Min:97.5 F (36.4 C), Max:100.2 F (37.9 C)  Propofol: 4.8 ml/hr  Pt currently suffering from cardiogenic shock. On Impella. De-satted into 50% yesterday, into ARF -> intubated. No family at bedside. He had an OG tube, but it was removed to perform a TEE.Tried to replace but were unable to. Per CCM note, recommends place cortrak tomorrow. His BP is becoming more stable. Likely will not come off propofol today, but its contribution to calories is minimal.  Labs: Na 134, CBG: 114 Medications: Vasopressin drip, Levo Drip, Milrinone drip  Diet Order:  Diet heart healthy/carb modified Room service appropriate?: Yes; Fluid consistency:: Thin  Skin:  Reviewed, no issues  Last BM:  PTA  Height:   Ht Readings from Last 1 Encounters:  08/14/15 6\' 4"  (1.93 m)    Weight:   Wt Readings from Last 1  Encounters:  04/26/15 212 lb 4.9 oz (96.3 kg)    Ideal Body Weight:  91.81 kg  BMI:  Body mass index is 25.85 kg/(m^2).  Estimated Nutritional Needs:   Kcal:  2234  Protein:  115-144 gm  Fluid:  >/= 2L  EDUCATION NEEDS:   No education needs identified at this time  Dionne AnoWilliam M. Francisco Ostrovsky, MS, RD LDN After Hours/Weekend Pager [phone]

## 2015-04-26 NOTE — Progress Notes (Signed)
This RN observed pt's effluent fluid was pink-red tinged when CRRT initiated. Per orders hemo filter changed immediately. Effluent fluid color remained the same. Dr. Darrick Pennaeterding page and made aware. Verbal orders given to continue CRRT. It is expected with pt's condition.

## 2015-04-26 NOTE — Progress Notes (Signed)
eLink Physician-Brief Progress Note Patient Name: Pamala DuffelJoseph Byron DOB: 11/04/1950 MRN: [id number]   Date of Service  04/26/2015  HPI/Events of Note  Called by nurse d/t code status does not agree with what was discussed with the patient and his cousin who agreed to No CPR, no defibrillation. However, did want code drugs. The patient is already on mechanical ventilation.   eICU Interventions  Will amend code status to reflect the above.     Intervention Category Minor Interventions: Routine modifications to care plan (e.g. PRN medications for pain, fever)  Sommer,Steven Eugene 04/26/2015, 7:55 PM

## 2015-04-26 NOTE — Progress Notes (Addendum)
ANTICOAGULATION CONSULT NOTE - Follow Up Consult  Pharmacy Consult for heparin  Indication: s/p impella pump/afib  No Known Allergies  Patient Measurements: Height: _0  (193 cm) Weight: 212 lb 4.9 oz (96.3 kg) IBW/kg (Calculated) : 86.8   Vital Signs: Temp: 99.5 F (37.5 C) (03/30 1615) Temp Source: Core (Comment) (03/30 1100) BP: 66/48 mmHg (03/30 1710) Pulse Rate: 113 (03/30 1710)  Labs:  Recent Labs  04/18/2015 1630  04/04/2015 0550  04/10/2015 1530 04/26/15 0400 04/26/15 1405 04/26/15 1433 04/26/15 1436  HGB 17.4*  < >  --   < > 15.1 14.2 14.3  --   --   HCT 49.8  < >  --   < > 44.7 40.9 41.2  --   --   PLT 268  < >  --   < > 217 207 209  --   --   CREATININE 1.73*  < >  --   < > 2.93* 3.56*  --   --  4.47*  CKTOTAL 372*  --   --   --   --   --   --  1041*  --   CKMB 11.2*  --   --   --   --   --   --   --   --   TROPONINI 0.06*  --  0.10*  --   --   --   --   --   --   < > = values in this interval not displayed.  Estimated Creatinine Clearance: 20.2 mL/min (by C-G formula based on Cr of 4.47).   Medications:  Scheduled:  . antiseptic oral rinse  7 mL Mouth Rinse 10 times per day  . chlorhexidine gluconate (SAGE KIT)  15 mL Mouth Rinse BID  . feeding supplement (PRO-STAT SUGAR FREE 64)  30 mL Per Tube TID  . insulin aspart  0-15 Units Subcutaneous 6 times per day  . pantoprazole (PROTONIX) IV  40 mg Intravenous Q24H  . piperacillin-tazobactam (ZOSYN)  IV  3.375 g Intravenous Once  . piperacillin-tazobactam (ZOSYN)  IV  3.375 g Intravenous 4 times per day  . sodium chloride flush  10-40 mL Intracatheter Q12H  . sodium chloride flush  3 mL Intravenous Q12H  . sodium chloride flush  3 mL Intravenous Q12H  . [START ON 04/27/2015] vancomycin  1,000 mg Intravenous Q24H    Assessment: 65 yo male with cardiogenic shock s/p RHC. Cardiac index= 1.3 and he is now on an impella pump   Systemic heparin gtt restarted Pt is about to being CRRT right now First afternoon  ACT = 157 >> drip increased to 1500 units/hr Second afternoon ACT = 157 >> drip increased to 1600 units/hr Making slow but good progress  Goal of Therapy:  ACT= 160-180 Monitor platelets by anticoagulation protocol: Yes   Plan:  -Increase heparin to 1600 units/hr -Watch levels closely after initiation of CRRT -Will follow ACTs and purge rates    Harvel Quale  04/26/2015 5:45 PM   Addendum -CRRT initiated around 1900 -Pt has had two therapeutic ACTs -Heparin running at 1700 units/hr -Continue to monitor ACTs, after 3 therapeutic levels can move to q4hr ACT  Harvel Quale  04/26/2015 8:55 PM   Addendum -Achieved three therapeutic ACTs with heparin at 1700 units/hr -Continue current rate -Next ACT at 0100  Harvel Quale  04/26/2015 10:36 PM

## 2015-04-26 NOTE — Progress Notes (Signed)
ANTICOAGULATION CONSULT NOTE - Follow Up Consult  Pharmacy Consult for heparin  Indication: s/p impella pump/afib  No Known Allergies  Patient Measurements: Height: 6' 4"  (193 cm) Weight: 212 lb 4.9 oz (96.3 kg) IBW/kg (Calculated) : 86.8   Vital Signs: Temp: 99.7 F (37.6 C) (03/30 1400) Temp Source: Core (Comment) (03/30 1100) BP: 104/79 mmHg (03/30 1400) Pulse Rate: 123 (03/30 1306)  Labs:  Recent Labs  04/06/2015 1630  04/16/2015 0550 04/02/2015 1051 03/31/2015 1530 04/26/15 0400 04/26/15 1405  HGB 17.4*  < >  --  17.2* 15.1 14.2 14.3  HCT 49.8  < >  --  49.4 44.7 40.9 41.2  PLT 268  < >  --  262 217 207 209  CREATININE 1.73*  < >  --  2.81* 2.93* 3.56*  --   CKTOTAL 372*  --   --   --   --   --   --   CKMB 11.2*  --   --   --   --   --   --   TROPONINI 0.06*  --  0.10*  --   --   --   --   < > = values in this interval not displayed.  Estimated Creatinine Clearance: 25.4 mL/min (by C-G formula based on Cr of 3.56).   Medications:  Scheduled:  . antiseptic oral rinse  7 mL Mouth Rinse 10 times per day  . chlorhexidine gluconate (SAGE KIT)  15 mL Mouth Rinse BID  . insulin aspart  0-15 Units Subcutaneous 6 times per day  . pantoprazole (PROTONIX) IV  40 mg Intravenous Q24H  . piperacillin-tazobactam (ZOSYN)  IV  3.375 g Intravenous Once  . piperacillin-tazobactam (ZOSYN)  IV  3.375 g Intravenous 4 times per day  . sodium chloride flush  10-40 mL Intracatheter Q12H  . sodium chloride flush  3 mL Intravenous Q12H  . sodium chloride flush  3 mL Intravenous Q12H  . vancomycin  2,000 mg Intravenous Once  . [START ON 04/27/2015] vancomycin  1,000 mg Intravenous Q24H    Assessment: 65 yo male with cardiogenic shock s/p RHC. Cardiac index= 1.3 and he is now on an impella pump  Systemic Heparin was off for CRRT access and now restarted.  -last ACT= 147 -purge 11.9 (595 units/hr) and systemic at 1300/hr (total ~ 20 units/kg/hr)  Goal of Therapy:  ACT= 160-180 Monitor  platelets by anticoagulation protocol: Yes   Plan:  -Bolus heparin 150 units IV x1 and increase to 1400 units/hr -Will follow ACTs and purge rates  Hildred Laser, Pharm D 04/26/2015 3:19 PM

## 2015-04-27 ENCOUNTER — Inpatient Hospital Stay (HOSPITAL_COMMUNITY): Payer: Medicare Other

## 2015-04-27 LAB — BASIC METABOLIC PANEL
ANION GAP: 11 (ref 5–15)
BUN: 53 mg/dL — ABNORMAL HIGH (ref 6–20)
CALCIUM: 7.5 mg/dL — AB (ref 8.9–10.3)
CO2: 19 mmol/L — ABNORMAL LOW (ref 22–32)
Chloride: 103 mmol/L (ref 101–111)
Creatinine, Ser: 3.57 mg/dL — ABNORMAL HIGH (ref 0.61–1.24)
GFR, EST AFRICAN AMERICAN: 19 mL/min — AB (ref >60)
GFR, EST NON AFRICAN AMERICAN: 17 mL/min — AB (ref >60)
GLUCOSE: 141 mg/dL — AB (ref 65–99)
Potassium: 4.4 mmol/L (ref 3.5–5.1)
SODIUM: 133 mmol/L — AB (ref 135–145)

## 2015-04-27 LAB — CBC
HCT: 37.9 % — ABNORMAL LOW (ref 39.0–52.0)
HCT: 35.3 % — ABNORMAL LOW (ref 39.0–52.0)
HEMOGLOBIN: 12.1 g/dL — AB (ref 13.0–17.0)
Hemoglobin: 13.2 g/dL (ref 13.0–17.0)
MCH: 31.5 pg (ref 26.0–34.0)
MCH: 30.9 pg (ref 26.0–34.0)
MCHC: 34.8 g/dL (ref 30.0–36.0)
MCHC: 34.3 g/dL (ref 30.0–36.0)
MCV: 90.5 fL (ref 78.0–100.0)
MCV: 90.1 fL (ref 78.0–100.0)
PLATELETS: 177 10*3/uL (ref 150–400)
Platelets: 159 10*3/uL (ref 150–400)
RBC: 4.19 MIL/uL — ABNORMAL LOW (ref 4.22–5.81)
RBC: 3.92 MIL/uL — AB (ref 4.22–5.81)
RDW: 13.3 % (ref 11.5–15.5)
RDW: 13.3 % (ref 11.5–15.5)
WBC: 21.8 10*3/uL — AB (ref 4.0–10.5)
WBC: 21.8 10*3/uL — ABNORMAL HIGH (ref 4.0–10.5)

## 2015-04-27 LAB — CARBOXYHEMOGLOBIN
CARBOXYHEMOGLOBIN: 1.9 % — AB (ref 0.5–1.5)
CARBOXYHEMOGLOBIN: 1.5 % (ref 0.5–1.5)
METHEMOGLOBIN: 1.8 % — AB (ref 0.0–1.5)
Methemoglobin: 1.6 % — ABNORMAL HIGH (ref 0.0–1.5)
O2 SAT: 72.4 %
O2 Saturation: 73.4 %
TOTAL HEMOGLOBIN: 12.2 g/dL — AB (ref 13.5–18.0)
Total hemoglobin: 13.7 g/dL (ref 13.5–18.0)

## 2015-04-27 LAB — HEPATIC FUNCTION PANEL
ALT: 451 U/L — ABNORMAL HIGH (ref 17–63)
AST: 942 U/L — AB (ref 15–41)
Albumin: 2.3 g/dL — ABNORMAL LOW (ref 3.5–5.0)
Alkaline Phosphatase: 103 U/L (ref 38–126)
BILIRUBIN DIRECT: 5.2 mg/dL — AB (ref 0.1–0.5)
BILIRUBIN TOTAL: 10.8 mg/dL — AB (ref 0.3–1.2)
Indirect Bilirubin: 5.6 mg/dL — ABNORMAL HIGH (ref 0.3–0.9)
Total Protein: 5.1 g/dL — ABNORMAL LOW (ref 6.5–8.1)

## 2015-04-27 LAB — GLUCOSE, CAPILLARY
GLUCOSE-CAPILLARY: 111 mg/dL — AB (ref 65–99)
GLUCOSE-CAPILLARY: 124 mg/dL — AB (ref 65–99)
GLUCOSE-CAPILLARY: 135 mg/dL — AB (ref 65–99)
Glucose-Capillary: 132 mg/dL — ABNORMAL HIGH (ref 65–99)
Glucose-Capillary: 136 mg/dL — ABNORMAL HIGH (ref 65–99)
Glucose-Capillary: 139 mg/dL — ABNORMAL HIGH (ref 65–99)

## 2015-04-27 LAB — POCT ACTIVATED CLOTTING TIME
ACTIVATED CLOTTING TIME: 178 s
ACTIVATED CLOTTING TIME: 178 s
ACTIVATED CLOTTING TIME: 178 s
ACTIVATED CLOTTING TIME: 173 s
Activated Clotting Time: 183 seconds
Activated Clotting Time: 157 seconds
Activated Clotting Time: 162 seconds
Activated Clotting Time: 178 seconds

## 2015-04-27 LAB — RENAL FUNCTION PANEL
ANION GAP: 13 (ref 5–15)
Albumin: 2.1 g/dL — ABNORMAL LOW (ref 3.5–5.0)
BUN: 38 mg/dL — ABNORMAL HIGH (ref 6–20)
CALCIUM: 7.5 mg/dL — AB (ref 8.9–10.3)
CHLORIDE: 102 mmol/L (ref 101–111)
CO2: 20 mmol/L — AB (ref 22–32)
Creatinine, Ser: 2.81 mg/dL — ABNORMAL HIGH (ref 0.61–1.24)
GFR calc non Af Amer: 22 mL/min — ABNORMAL LOW (ref >60)
GFR, EST AFRICAN AMERICAN: 26 mL/min — AB (ref >60)
Glucose, Bld: 153 mg/dL — ABNORMAL HIGH (ref 65–99)
Phosphorus: 3.9 mg/dL (ref 2.5–4.6)
Potassium: 4.7 mmol/L (ref 3.5–5.1)
Sodium: 135 mmol/L (ref 135–145)

## 2015-04-27 LAB — HEPATITIS B SURFACE ANTIGEN: HEP B S AG: NEGATIVE

## 2015-04-27 LAB — HEMOGLOBIN A1C
Hgb A1c MFr Bld: 6.6 % — ABNORMAL HIGH (ref 4.8–5.6)
MEAN PLASMA GLUCOSE: 143 mg/dL

## 2015-04-27 LAB — APTT

## 2015-04-27 LAB — PHOSPHORUS: Phosphorus: 4.5 mg/dL (ref 2.5–4.6)

## 2015-04-27 LAB — URINE CULTURE: Culture: NO GROWTH

## 2015-04-27 LAB — MAGNESIUM
MAGNESIUM: 2.4 mg/dL (ref 1.7–2.4)
Magnesium: 2.2 mg/dL (ref 1.7–2.4)

## 2015-04-27 LAB — PARATHYROID HORMONE, INTACT (NO CA): PTH: 411 pg/mL — AB (ref 15–65)

## 2015-04-27 MED ORDER — CHLORHEXIDINE GLUCONATE 0.12 % MT SOLN
OROMUCOSAL | Status: AC
  Filled 2015-04-27: qty 15

## 2015-04-27 NOTE — Progress Notes (Signed)
Critical PTT received. Pt on heparin gtt. Value expected d/t heparin gtt.

## 2015-04-27 NOTE — Progress Notes (Signed)
Impella insertion site observed and assessed with night shift RN at 0730. Site level zero with small purple bruise coming down left hip which was soft. AM rxay's done at ~0745. Site reassessed and >3cm hematoma found. Manual pressure immediately applied. Dr. Shirlee LatchMcLean notified. Heparin gtt stopped. Manual pressure held for greater than 60mins. Fem stop orders given. Cath lab RN came to assist with fem stop placement. Site still bleeding with 50mmHg applied to fem stop. Dr. Shirlee LatchMclean back to bedside. Impella introducer adjusted. Bleeding slowed down. Dr. Gala RomneyBensimhon to bedside. Impella introducer adjusted again. Site secured. Pressure  Dressing applied. Bleeding stopped. Site level one with large bruising, but no hematoma palpated. Will continue to monitor and assess pt closely. VSS throughout this time. Heparin resumed ~2 hrs later. ACT still therapeutic.

## 2015-04-27 NOTE — Progress Notes (Signed)
Subjective: Interval History: on vent, 2 pressor PCW30 now.  Objective: Vital signs in last 24 hours: Temp:  [96.1 F (35.6 C)-100.2 F (37.9 C)] 96.1 F (35.6 C) (03/31 0700) Pulse Rate:  [109-123] 113 (03/30 1710) Resp:  [12-21] 18 (03/31 0700) BP: (66-130)/(44-95) 107/71 mmHg (03/31 0700) SpO2:  [89 %-99 %] 97 % (03/31 0530) Arterial Line BP: (69-114)/(52-72) 97/64 mmHg (03/31 0700) FiO2 (%):  [40 %] 40 % (03/31 0341) Weight:  [98.1 kg (216 lb 4.3 oz)] 98.1 kg (216 lb 4.3 oz) (03/31 0340) Weight change: -2.8 kg (-6 lb 2.8 oz)  Intake/Output from previous day: 03/30 0701 - 03/31 0700 In: 3866.6 [I.V.:2883.7; IV Piggyback:700] Out: 1705 [Urine:62] Intake/Output this shift:    General appearance: pale . sedated, on vent,  Resp: rales bibasilar and rhonchi bibasilar Cardio: S1, S2 normal and impella GI: mod distension ,bs very few Extremities: edema 3+  Lab Results:  Recent Labs  04/26/15 1405 04/27/15 0430  WBC 24.3* 21.8*  HGB 14.3 13.2  HCT 41.2 37.9*  PLT 209 177   BMET:  Recent Labs  04/26/15 1436 04/27/15 0430  NA 133* 133*  K 5.1 4.4  CL 103 103  CO2 12* 19*  GLUCOSE 140* 141*  BUN 77* 53*  CREATININE 4.47* 3.57*  CALCIUM 7.9* 7.5*   No results for input(s): PTH in the last 72 hours. Iron Studies: No results for input(s): IRON, TIBC, TRANSFERRIN, FERRITIN in the last 72 hours.  Studies/Results: Dg Abd 1 View  04/26/2015  CLINICAL DATA:  Enteric tube placement EXAM: ABDOMEN - 1 VIEW COMPARISON:  None. FINDINGS: Enteric tube terminates near the esophagogastric junction and should be advanced 5 cm. Left femoral approach Impella device terminates over the left ventricle. Right femoral approach central venous catheter terminates over the right common iliac vessel region. Right internal jugular Swan-Ganz catheter terminates over the right hilum. Dilated small bowel loops throughout the central abdomen. No evidence of pneumatosis or pneumoperitoneum.  IMPRESSION: Enteric tube terminates near the esophagogastric junction and should be advanced 5 cm. Dilated small bowel loops throughout the central abdomen, either adynamic ileus or distal small bowel obstruction. These results were called by telephone at the time of interpretation on 04/26/2015 at 7:08 pm to RN [facility] Center[facility]NA[facility] PADGETT, who verbally acknowledged these results. Electronically Signed   By: Delbert PhenixJason A Poff M.D.   On: 04/26/2015 19:10   [place] Renal Port  04/26/2015  CLINICAL DATA:  Acute kidney injury EXAM: RENAL / URINARY TRACT ULTRASOUND COMPLETE COMPARISON:  None. FINDINGS: Right Kidney: Length: 14.9 cm. Echogenicity within normal limits. No solid mass or hydronephrosis visualized. Multiple cysts. Largest 66 x 49 x 59 mm, exophytic from the inferior pole. Adjacent 42 x 41 x 35 mm and 34 x 32 x 33 mm cysts medially at the inferior pole. Left Kidney: Length: 12.7 cm. Echogenicity within normal limits. No solid mass or hydronephrosis visualized. Adjacent upper pole cysts 4 x 3.8 x 3.7 cm and 4.1 x 3.2 x 3.8 cm. Lower pole cyst 24 x 26 x 23 mm. Bladder: Decompressed by Foley catheter. There is a small amount of pelvic ascites noted. IMPRESSION: 1. Negative for hydronephrosis. 2. Bilateral renal cysts. 3. Small amount of pelvic ascites. Electronically Signed   By: Corlis Leak  Hassell M.D.   On: 04/26/2015 16:12   Dg Chest Port 1 View  04/26/2015  CLINICAL DATA:  Verify orogastric tube placement. EXAM: PORTABLE CHEST 1 VIEW COMPARISON:  04/26/2015 at 4:34 a.m. FINDINGS: Orogastric tube tip projects at or just  below the GE junction level. Recommend further inserting another 10-15 cm to allow the tip to more fully into the stomach. There is persistent hazy opacity at the lung bases consistent with pleural effusions. IMPRESSION: Orogastric tube tip projects at or just below the GE junction. Recommend further inserting 10-15 cm. Electronically Signed   By: Amie Portland M.D.   On: 04/26/2015 19:11   Dg Chest Port 1  View  04/26/2015  CLINICAL DATA:  65 year old male with respiratory failure. Cardiogenic shock. Initial encounter. EXAM: PORTABLE CHEST 1 VIEW COMPARISON:  04/15/2015 and earlier. FINDINGS: Portable AP semi upright view at 0434 hours. Endotracheal tube tip in good position just below the clavicles. Stable right IJ approach Swan-Ganz catheter, tip at the right lower lobe pulmonary artery at the hilum. Impella device re- demonstrated over the cardiac apex. Pacer or resuscitation pads in place. Multiple EKG leads and wires overlie the chest. Stable cardiac size and mediastinal contours. Stable lung volumes in ventilation. Mild veiling opacity at both lung bases. Stable pulmonary vascularity without overt edema. No pneumothorax. No air bronchograms identified. IMPRESSION: 1.  Stable lines and tubes. 2. Stable ventilation with suspected bilateral pleural effusions, no overt edema. Electronically Signed   By: Odessa Fleming M.D.   On: 04/26/2015 08:29   Dg Chest Port 1 View  04/05/2015  CLINICAL DATA:  Intubated EXAM: PORTABLE CHEST 1 VIEW COMPARISON:  Chest radiograph from earlier today. FINDINGS: Right internal jugular Swan-Ganz catheter terminates over the distal right pulmonary artery. Inferior approach Impella device terminates over the left ventricle. Endotracheal tube tip is 8 cm above the carina at the thoracic inlet. Stable cardiomediastinal silhouette with top-normal heart size. No pneumothorax. No pleural effusion. Mild pulmonary edema appears slightly decreased. IMPRESSION: 1. Endotracheal tube tip is 8 cm above the carina at the thoracic inlet, consider advancing 2-3 cm. 2. Mild pulmonary edema, slightly decreased. Electronically Signed   By: Delbert Phenix M.D.   On: 04/27/2015 19:31   Dg Chest Port 1 View  04/15/2015  CLINICAL DATA:  Swan-Ganz catheter placement, status post right heart catheterization EXAM: PORTABLE CHEST 1 VIEW COMPARISON:  04/26/2015 FINDINGS: Borderline cardiomegaly again noted. Elevation  of the left hemidiaphragm again noted. Stable small right pleural effusion with bibasilar atelectasis. There is right IJ Swan-Ganz catheter with tip in the region of right main pulmonary artery. No pneumothorax. IMPRESSION: Right IJ Swan-Ganz catheter with tip in the region of right main pulmonary artery. Persistent small right pleural effusion with right basilar atelectasis. No pneumothorax. Electronically Signed   By: Natasha Mead M.D.   On: 04/20/2015 15:30    I have reviewed the patient's current medications.  Assessment/Plan: 1 AKI vol xs will try for net neg if bp tol. Sat ok.  Acid/base ok.  Will try to lower solute.   2 Hemolysis impella 3 Cardiogenic schock. Per cards , pressors, inotropes 4 ^LFTs, lower 5 VDRf per CCM   O2 ok P ^ DFR, net neg 50, follow bp, pressors, inotropes, adjust meds per GFR    LOS: 2 days   Jordanny Waddington L 04/27/2015,7:39 AM

## 2015-04-27 NOTE — Progress Notes (Signed)
PULMONARY / CRITICAL CARE MEDICINE   Name: Wesley Tyler MRN: [id number] DOB: Jul 19, 1950    ADMISSION DATE:  04/15/2015 CONSULTATION DATE:  May 02, 2015  REFERRING MD:  EDP  CHIEF COMPLAINT:  SOB  HISTORY OF PRESENT ILLNESS:  Wesley Tyler is a 65 y.o. male with no known PMH.  He had not seen a health care provider since his 51's and on 04/16/2015, his cousin made him go to an urgent care center to establish care and for further evaluation of occasional palpitations that pt had been experiencing on and off.  Palpitations had apparently worsened over the past 6 months and for the past 2 weeks, his heart had been continually beating "fast and hard".  He had not had any exacerbating or alleviating factors.  He has had associated severe SOB that is worse with exertion, increase in LE edema, PND and orthopnea.  Per cousin, pt sleeps in a chair sitting upright.   He has also had mild intermittent cough that was non productive.  Denies any fevers/chills/sweats, headaches, lightheadedness, chest pain, N/V/D, abd pain, myalgias.  At urgent care he was found to be in A.fib with RVR and was treated with lopressor and eliquis.  Lab work showed elevated BNP and troponin so pt was instructed to come to ED for further evaluation.  In ED, he had AKI with SCr 1.79 > 2.21 (unknown baseline), lactic acidosis (lactate 4.3), transaminitis, TSH 6.  PCCM was called for admission.   SUBJECTIVE:  BP still low.  On impella. On milrinone and levophed.  Epi drip weaned off.on CVVH. Bleeding per groin lines > heparin drip stopped.   VITAL SIGNS: BP 96/65 mmHg  Pulse 104  Temp(Src) 96.1 F (35.6 C) (Oral)  Resp 18  Ht  (1.93 m)  Wt 216 lb 4.3 oz (98.1 kg)  BMI 26.34 kg/m2  SpO2 98%  HEMODYNAMICS: PAP: (33-60)/(21-37) 40/23 mmHg CVP:  [7 mmHg-32 mmHg] 22 mmHg PCWP:  [16 mmHg-25 mmHg] 25 mmHg CO:  [5.5 L/min-6.7 L/min] 5.5 L/min CI:  [2.4 L/min/m2-2.9 L/min/m2] 2.4 L/min/m2  VENTILATOR SETTINGS: Vent Mode:   [-] PRVC FiO2 (%):  [40 %-60 %] 60 % Set Rate:  [18 bmp] 18 bmp Vt Set:  [620 mL] 620 mL PEEP:  [8 cmH20] 8 cmH20 Plateau Pressure:  [11 cmH20-19 cmH20] 18 cmH20  INTAKE / OUTPUT: I/O last 3 completed shifts: In: 5008.1 [I.V.:3894.3; Other:413.8; IV Piggyback:700] Out: 1775 [Urine:132; Other:1643]  PHYSICAL EXAMINATION: General: Caucasian male, in NAD. sedated Neuro: A&O x 3, CN grossly intact HEENT: [place]/AT. PERRL, sclerae anicteric. Cardiovascular: IRIR, no M/R/G.  Lungs: Respirations even and unlabored. Bibasilar crackles > more crackles  Abdomen: BS x 4, soft, NT/ND.  Musculoskeletal: No gross deformities, 3+ edema to knees. Skin: Intact, cool, no rashes.  LABS:  BMET  Recent Labs Lab 04/26/15 0400 04/26/15 1436 04/27/15 0430  NA 134* 133* 133*  K 4.0 5.1 4.4  CL 104 103 103  CO2 17* 12* 19*  BUN 69* 77* 53*  CREATININE 3.56* 4.47* 3.57*  GLUCOSE 117* 140* 141*    Electrolytes  Recent Labs Lab 05-02-2015 1051  04/26/15 0400 04/26/15 1436 04/27/15 0430  CALCIUM 9.0  < > 8.1* 7.9* 7.5*  MG 2.8*  --  2.5*  --  2.2  PHOS 6.4*  --  5.1* 5.2* 4.5  < > = values in this interval not displayed.  CBC  Recent Labs Lab 04/26/15 0400 04/26/15 1405 04/27/15 0430  WBC 17.8* 24.3* 21.8*  HGB 14.2 14.3 13.2  HCT 40.9 41.2 37.9*  PLT 207 209 177    Coag's  Recent Labs Lab 04/27/15 0430  APTT >200*    Sepsis Markers  Recent Labs Lab 04/26/2015 0357 04/19/2015 0550 04/26/15 1433  LATICACIDVEN 4.53* 2.7* 3.0*  PROCALCITON  --  0.16  --     ABG  Recent Labs Lab 03/31/2015 0146 04/14/2015 2018 04/26/15 0405  PHART 7.459* 7.412 7.453*  PCO2ART 17.1* 33.9* 33.2*  PO2ART 457* 475.0* 201.0*    Liver Enzymes  Recent Labs Lab 04/13/2015 1530 04/26/15 0400 04/26/15 1436 04/27/15 0430  AST 1177* 1472*  --  942*  ALT 721* 601*  --  451*  ALKPHOS 136* 114  --  103  BILITOT 2.5* 5.4*  --  10.8*  ALBUMIN 3.0* 2.7* 2.5* 2.3*    Cardiac  Enzymes  Recent Labs Lab 04/21/2015 1630 04/12/2015 0550  TROPONINI 0.06* 0.10*    Glucose  Recent Labs Lab 04/26/15 0757 04/26/15 1305 04/26/15 1559 04/26/15 2001 04/26/15 2359 04/27/15 0326  GLUCAP 114* 136* 144* 141* 135* 111*    Imaging Dg Abd 1 View  04/26/2015  CLINICAL DATA:  Enteric tube placement EXAM: ABDOMEN - 1 VIEW COMPARISON:  None. FINDINGS: Enteric tube terminates near the esophagogastric junction and should be advanced 5 cm. Left femoral approach Impella device terminates over the left ventricle. Right femoral approach central venous catheter terminates over the right common iliac vessel region. Right internal jugular Swan-Ganz catheter terminates over the right hilum. Dilated small bowel loops throughout the central abdomen. No evidence of pneumatosis or pneumoperitoneum. IMPRESSION: Enteric tube terminates near the esophagogastric junction and should be advanced 5 cm. Dilated small bowel loops throughout the central abdomen, either adynamic ileus or distal small bowel obstruction. These results were called by telephone at the time of interpretation on 04/26/2015 at 7:08 pm to RN [facility], who verbally acknowledged these results. Electronically Signed   By: Delbert Phenix M.D.   On: 04/26/2015 19:10   US Renal Port  04/26/2015  CLINICAL DATA:  Acute kidney injury EXAM: RENAL / URINARY TRACT ULTRASOUND COMPLETE COMPARISON:  None. FINDINGS: Right Kidney: Length: 14.9 cm. Echogenicity within normal limits. No solid mass or hydronephrosis visualized. Multiple cysts. Largest 66 x 49 x 59 mm, exophytic from the inferior pole. Adjacent 42 x 41 x 35 mm and 34 x 32 x 33 mm cysts medially at the inferior pole. Left Kidney: Length: 12.7 cm. Echogenicity within normal limits. No solid mass or hydronephrosis visualized. Adjacent upper pole cysts 4 x 3.8 x 3.7 cm and 4.1 x 3.2 x 3.8 cm. Lower pole cyst 24 x 26 x 23 mm. Bladder: Decompressed by Foley catheter. There is a small amount of  pelvic ascites noted. IMPRESSION: 1. Negative for hydronephrosis. 2. Bilateral renal cysts. 3. Small amount of pelvic ascites. Electronically Signed   By: Corlis Leak M.D.   On: 04/26/2015 16:12   Dg Chest Port 1 View  04/27/2015  CLINICAL DATA:  Hypoxia EXAM: PORTABLE CHEST 1 VIEW COMPARISON:  April 26, 2015 FINDINGS: Endotracheal tube tip is 5.1 cm above the carina. Nasogastric tube tip and side port are in the proximal stomach. Swan-Ganz catheter tip is in the proximal right lower lobe pulmonary artery, unchanged. No pneumothorax. There are bilateral pleural effusions with patchy bibasilar atelectasis. There is no frank edema or consolidation. Heart is slightly enlarged with pulmonary vascularity within normal limits. No adenopathy evident. IMPRESSION: Tube and catheter positions as described without pneumothorax. Note that the Swan-Ganz catheter tip  is rather peripherally positioned in the proximal right lower lobe pulmonary artery. There are bilateral pleural effusions with bibasilar atelectasis. Stable cardiac prominence. No new opacity evident. Electronically Signed   By: Bretta BangWilliam  Woodruff III M.D.   On: 04/27/2015 07:55   Dg Chest Port 1 View  04/26/2015  CLINICAL DATA:  Verify orogastric tube placement. EXAM: PORTABLE CHEST 1 VIEW COMPARISON:  04/26/2015 at 4:34 a.m. FINDINGS: Orogastric tube tip projects at or just below the GE junction level. Recommend further inserting another 10-15 cm to allow the tip to more fully into the stomach. There is persistent hazy opacity at the lung bases consistent with pleural effusions. IMPRESSION: Orogastric tube tip projects at or just below the GE junction. Recommend further inserting 10-15 cm. Electronically Signed   By: Amie Portlandavid  Ormond M.D.   On: 04/26/2015 19:11   Dg Abd Portable 1v  04/27/2015  CLINICAL DATA:  Evaluate NG tube positioning EXAM: PORTABLE ABDOMEN - 1 VIEW COMPARISON:  04/26/2015; chest radiograph - 04/27/2015; 04/26/2015 FINDINGS: Interval  advancement of enteric tube with tip projected over the distal esophagus. There is persistent marked gas distention of multiple centralized loops of rather featureless appearing small bowel with index loop within the left mid hemi abdomen measuring approximately 5.4 cm in diameter. There is minimal gaseous distension of the cecum and splenic flexure of the colon. No supine evidence of pneumoperitoneum. No definite pneumatosis or portal venous gas. Limited visualization of lower thorax demonstrates an enlarged cardiac silhouette. Presumed pacer leads overlying cardiac apex. Grossly unchanged infrahilar heterogeneous/consolidative opacities. Suspected trace bilateral effusions. The tip of a right femoral approach vascular sheath overlies the cranial aspect of the right sacral ala. No acute osseus abnormalities. IMPRESSION: 1. Enteric tube tip side port projects over the distal esophagus. Advancement at least 9 cm is recommended. 2. Similar findings worrisome for at least partial small bowel obstruction. Continued attention on follow-up is recommended. Electronically Signed   By: Simonne ComeJohn  Watts M.D.   On: 04/27/2015 07:57     STUDIES:  CXR 03/28 > pulm edema TEE (3/29) EF 5-10%, diffuse hypokinesis, mod AI and mod MR, severe TR   CULTURES: Blood 03/29 > Urine 03/29 > Sputum 03/29 >  ANTIBIOTICS: Zosyn 3/30 > Vanc 3/30 >   SIGNIFICANT EVENTS: 03/29 > admitted with A.fib, AKI, AGMA, transaminitis, hypothyroidism. Pt intubated for resp distress.EF 5-10% 3/30 > cvvh started  LINES/TUBES: CVL pending by EDP 03/29 >  DISCUSSION: 65 y.o. M with unknown PMH, [facility] after he was found to have A.fib with RVR at urgent care earlier in the day.  In ED, he was found to have AKI, AGMA, transaminitis, and hypothyroidism.  PCCM was asked to admit.  ASSESSMENT / PLAN:  PULMONARY A: Acute hypoxic respiratory failure 2/2 acute CHF exacerbation/pulm edema; R/O HCAP Former smoker. P:   Cont  vent support. Not ready for wean.  See cards section See ID section  CARDIOVASCULAR A:  Cardiogenic shock 2/2 Acute CHF exacerbation, EF 5-10%, CAD (diffuse hypokinesis) S/P Impella (3/29) A.fib with RVR   P:  CHF team following. Appreciate input. Pt with swan catheter. PCWP 16 > 28 today; CI 2.5 Cont milrinone, levophed drip, vasopressin. Epi drip was weaned off Cont CVVH Heparin gtt, amiodarone drip > heparin drip held today 2/2 bleeding per groin  RENAL A:   AKI - unknown baseline. Worsening renal fxn. AKI 2/2 ATN, cardio renal syndrome AGMA - lactate.  P:   CVVH Appreciate renal input.   GASTROINTESTINAL  A:   Transaminitis  2/2 Hepatic congestion fromCHF Ileus vs SBO P:   LFT's better Hold TF 2/2 ileus/SBO > rpt KUB later  HEMATOLOGIC A:   Hemoconcentration. VTE Prophylaxis. P:  SCD's / Heparin gtt > heparin drip held 2/2 to groin bleeding CBC in AM.  INFECTIOUS A:   Leukocytosis with fever, dec SVR P:   Follow cultures as above. Vanc/zosyn   ENDOCRINE A:   Hyperglycemia - no hx DM. Hypothyroidism.  P:   SSI. Synthroid - cont Assess Hgb A1c, TSH.  NEUROLOGIC A:   No acute issues. P:   On propofol and fentanyl drips    Family updated: Cousin Shara Blazing) updated at bedside. Only family around. Pt is DNR, no cardioversion.   Interdisciplinary Family Meeting v Palliative Care Meeting:  Due by: 05/01/15.  CC time: 35 minutes critical care time spent with this pt today.    Pollie Meyer, MD 04/27/2015, 10:08 AM [facility] Pager [phone] After 3 pm or if no answer, call [phone]

## 2015-04-27 NOTE — Progress Notes (Addendum)
Advanced Heart Failure Rounding Note  PCP: Recently established with Iran Planas, PA-C - Family medicine (04/12/2015) Primary Cardiologist: New  Subjective:    Admitted 3/28 with cardiogenic shock.  He became critically worse with C.I. of 0.84 despite Impella and Dobutamine 1 on 3/29.  Dobutamine switched to milrinone as he was also getting amiodarone.  De-satted into 50% with profound respiratory fatigue and was intubated.   He developed progressive AKI and was started on CVVH.  Hypotensive yesterday, epinephrine, norepinephrine, and vasopressin begun and milrinone decreased to 0.125.  He is now off epinephrine.   Stable this morning.  SBP in 100s currently on norepinephrine 42, vasopressin 0.03, and milrinone 0.375.  CVVH ongoing.   Remains in atrial fibrillation, HR in 100s on amiodarone.  TEE (3/29):  EF 5-10% diffuse hypokinesis, dilated RV with severely decreased systolic function, moderate AI, moderate MR, severe TR. Spontaneous left to right bubbles but unable to discover source (not ASD or VSD, ?pulmonary AVMs).   Intubated, sedated.   Luiz Blare numbers (done personally this morning): CVP 15 PA 37/24 PCWP 28 CI 2.5 Co-ox 73% SVR 707  Objective:   Weight Range: 216 lb 4.3 oz (98.1 kg) Body mass index is 26.34 kg/(m^2).   Vital Signs:   Temp:  [96.1 F (35.6 C)-100.2 F (37.9 C)] 96.1 F (35.6 C) (03/31 0700) Pulse Rate:  [109-123] 113 (03/30 1710) Resp:  [12-21] 18 (03/31 0700) BP: (66-130)/(44-95) 107/71 mmHg (03/31 0700) SpO2:  [89 %-99 %] 97 % (03/31 0530) Arterial Line BP: (69-114)/(52-72) 97/64 mmHg (03/31 0700) FiO2 (%):  [40 %] 40 % (03/31 0341) Weight:  [216 lb 4.3 oz (98.1 kg)] 216 lb 4.3 oz (98.1 kg) (03/31 0340) Last BM Date:  (PTA)  Weight change: Filed Weights   04/01/2015 1000 04/26/15 0500 04/27/15 0340  Weight: 222 lb 7.1 oz (100.9 kg) 212 lb 4.9 oz (96.3 kg) 216 lb 4.3 oz (98.1 kg)    Intake/Output:   Intake/Output Summary (Last 24 hours)  at 04/27/15 0741 Last data filed at 04/27/15 0700  Gross per 24 hour  Intake 3866.55 ml  Output   1705 ml  Net 2161.55 ml     Physical Exam: General:  Intubated, sedated. HEENT: normal Neck: supple. JVP 9-10. Carotids 2+ bilat; no bruits. No thyromegaly or lymphadenopathy.  Cor: PMI displaced laterally. Irregular rate & rhythm. +S3 Lungs: Mechanical breath sounds Abdomen: soft, NT, ND, no HSM. No bruits or masses. +BS  Extremities: Dusky and cold to touch to mid LLE. 2-3+ edema into thighs. Unable to palpate pedal pulses. Neuro: Intubated, sedated.  Telemetry: Reviewed, Afib 100s  Labs: CBC  Recent Labs  04/14/2015 1530  04/26/15 1405 04/27/15 0430  WBC 12.6*  < > 24.3* 21.8*  NEUTROABS 10.6*  --  20.1*  --   HGB 15.1  < > 14.3 13.2  HCT 44.7  < > 41.2 37.9*  MCV 92.9  < > 90.5 90.5  PLT 217  < > 209 177  < > = values in this interval not displayed. Basic Metabolic Panel  Recent Labs  04/26/15 0400 04/26/15 1436 04/27/15 0430  NA 134* 133* 133*  K 4.0 5.1 4.4  CL 104 103 103  CO2 17* 12* 19*  GLUCOSE 117* 140* 141*  BUN 69* 77* 53*  CREATININE 3.56* 4.47* 3.57*  CALCIUM 8.1* 7.9* 7.5*  MG 2.5*  --  2.2  PHOS 5.1* 5.2*  --    Liver Function Tests  Recent Labs  04/26/15 0400  04/26/15 1436 04/27/15 0430  AST 1472*  --  942*  ALT 601*  --  451*  ALKPHOS 114  --  103  BILITOT 5.4*  --  10.8*  PROT 4.9*  --  5.1*  ALBUMIN 2.7* 2.5* 2.3*   No results for input(s): LIPASE, AMYLASE in the last 72 hours. Cardiac Enzymes  Recent Labs  04/23/2015 1630 04/08/2015 0550 04/26/15 1433  CKTOTAL 372*  --  1041*  CKMB 11.2*  --   --   TROPONINI 0.06* 0.10*  --     BNP: BNP (last 3 results)  Recent Labs  04/15/2015 2338  BNP 612.8*    ProBNP (last 3 results) No results for input(s): PROBNP in the last 8760 hours.   D-Dimer No results for input(s): DDIMER in the last 72 hours. Hemoglobin A1C  Recent Labs  04/26/15 0400  HGBA1C 6.6*   Fasting  Lipid Panel  Recent Labs  04/26/15 0400  CHOL 101  HDL 26*  LDLCALC 65  TRIG 52  CHOLHDL 3.9   Thyroid Function Tests  Recent Labs  03/29/2015 1630  TSH 6.00*    Other results:     Imaging/Studies:  Dg Abd 1 View  04/26/2015  CLINICAL DATA:  Enteric tube placement EXAM: ABDOMEN - 1 VIEW COMPARISON:  None. FINDINGS: Enteric tube terminates near the esophagogastric junction and should be advanced 5 cm. Left femoral approach Impella device terminates over the left ventricle. Right femoral approach central venous catheter terminates over the right common iliac vessel region. Right internal jugular Swan-Ganz catheter terminates over the right hilum. Dilated small bowel loops throughout the central abdomen. No evidence of pneumatosis or pneumoperitoneum. IMPRESSION: Enteric tube terminates near the esophagogastric junction and should be advanced 5 cm. Dilated small bowel loops throughout the central abdomen, either adynamic ileus or distal small bowel obstruction. These results were called by telephone at the time of interpretation on 04/26/2015 at 7:08 pm to RN [facility], who verbally acknowledged these results. Electronically Signed   By: Ilona Sorrel M.D.   On: 04/26/2015 19:10   US Renal Port  04/26/2015  CLINICAL DATA:  Acute kidney injury EXAM: RENAL / URINARY TRACT ULTRASOUND COMPLETE COMPARISON:  None. FINDINGS: Right Kidney: Length: 14.9 cm. Echogenicity within normal limits. No solid mass or hydronephrosis visualized. Multiple cysts. Largest 66 x 49 x 59 mm, exophytic from the inferior pole. Adjacent 42 x 41 x 35 mm and 34 x 32 x 33 mm cysts medially at the inferior pole. Left Kidney: Length: 12.7 cm. Echogenicity within normal limits. No solid mass or hydronephrosis visualized. Adjacent upper pole cysts 4 x 3.8 x 3.7 cm and 4.1 x 3.2 x 3.8 cm. Lower pole cyst 24 x 26 x 23 mm. Bladder: Decompressed by Foley catheter. There is a small amount of pelvic ascites noted. IMPRESSION: 1.  Negative for hydronephrosis. 2. Bilateral renal cysts. 3. Small amount of pelvic ascites. Electronically Signed   By: Lucrezia Europe M.D.   On: 04/26/2015 16:12   Dg Chest Port 1 View  04/26/2015  CLINICAL DATA:  Verify orogastric tube placement. EXAM: PORTABLE CHEST 1 VIEW COMPARISON:  04/26/2015 at 4:34 a.m. FINDINGS: Orogastric tube tip projects at or just below the GE junction level. Recommend further inserting another 10-15 cm to allow the tip to more fully into the stomach. There is persistent hazy opacity at the lung bases consistent with pleural effusions. IMPRESSION: Orogastric tube tip projects at or just below the GE junction. Recommend further inserting 10-15 cm.  Electronically Signed   By: Lajean Manes M.D.   On: 04/26/2015 19:11   Dg Chest Port 1 View  04/26/2015  CLINICAL DATA:  65 year old male with respiratory failure. Cardiogenic shock. Initial encounter. EXAM: PORTABLE CHEST 1 VIEW COMPARISON:  04/19/2015 and earlier. FINDINGS: Portable AP semi upright view at 0434 hours. Endotracheal tube tip in good position just below the clavicles. Stable right IJ approach Swan-Ganz catheter, tip at the right lower lobe pulmonary artery at the hilum. Impella device re- demonstrated over the cardiac apex. Pacer or resuscitation pads in place. Multiple EKG leads and wires overlie the chest. Stable cardiac size and mediastinal contours. Stable lung volumes in ventilation. Mild veiling opacity at both lung bases. Stable pulmonary vascularity without overt edema. No pneumothorax. No air bronchograms identified. IMPRESSION: 1.  Stable lines and tubes. 2. Stable ventilation with suspected bilateral pleural effusions, no overt edema. Electronically Signed   By: Genevie Ann M.D.   On: 04/26/2015 08:29   Dg Chest Port 1 View  04/27/2015  CLINICAL DATA:  Intubated EXAM: PORTABLE CHEST 1 VIEW COMPARISON:  Chest radiograph from earlier today. FINDINGS: Right internal jugular Swan-Ganz catheter terminates over the distal  right pulmonary artery. Inferior approach Impella device terminates over the left ventricle. Endotracheal tube tip is 8 cm above the carina at the thoracic inlet. Stable cardiomediastinal silhouette with top-normal heart size. No pneumothorax. No pleural effusion. Mild pulmonary edema appears slightly decreased. IMPRESSION: 1. Endotracheal tube tip is 8 cm above the carina at the thoracic inlet, consider advancing 2-3 cm. 2. Mild pulmonary edema, slightly decreased. Electronically Signed   By: Ilona Sorrel M.D.   On: 04/12/2015 19:31   Dg Chest Port 1 View  04/20/2015  CLINICAL DATA:  Swan-Ganz catheter placement, status post right heart catheterization EXAM: PORTABLE CHEST 1 VIEW COMPARISON:  04/26/2015 FINDINGS: Borderline cardiomegaly again noted. Elevation of the left hemidiaphragm again noted. Stable small right pleural effusion with bibasilar atelectasis. There is right IJ Swan-Ganz catheter with tip in the region of right main pulmonary artery. No pneumothorax. IMPRESSION: Right IJ Swan-Ganz catheter with tip in the region of right main pulmonary artery. Persistent small right pleural effusion with right basilar atelectasis. No pneumothorax. Electronically Signed   By: Lahoma Crocker M.D.   On: 04/14/2015 15:30    Latest Echo  Latest Cath   Medications:     Scheduled Medications: . antiseptic oral rinse  7 mL Mouth Rinse 10 times per day  . chlorhexidine gluconate (SAGE KIT)  15 mL Mouth Rinse BID  . feeding supplement (PRO-STAT SUGAR FREE 64)  30 mL Per Tube TID  . insulin aspart  0-15 Units Subcutaneous 6 times per day  . pantoprazole (PROTONIX) IV  40 mg Intravenous Q24H  . piperacillin-tazobactam (ZOSYN)  IV  3.375 g Intravenous 4 times per day  . sodium chloride flush  10-40 mL Intracatheter Q12H  . sodium chloride flush  3 mL Intravenous Q12H  . sodium chloride flush  3 mL Intravenous Q12H  . vancomycin  1,000 mg Intravenous Q24H    Infusions: . sodium chloride Stopped (04/03/2015  1041)  . amiodarone 30 mg/hr (04/27/15 0235)  . epinephrine Stopped (04/26/15 2230)  . feeding supplement (VITAL 1.5 CAL)    . fentaNYL infusion INTRAVENOUS 75 mcg/hr (04/26/15 1145)  . impella catheter heparin 50 unit/mL in dextrose 5%    . heparin 1,600 Units/hr (04/27/15 0630)  . milrinone 0.125 mcg/kg/min (04/27/15 3557)  . norepinephrine (LEVOPHED) Adult infusion 42 mcg/min (04/27/15 0700)  .  dialysis replacement fluid (prismasate) 700 mL/hr at 04/27/15 0124  . dialysis replacement fluid (prismasate) 700 mL/hr at 04/27/15 0124  . dialysate (PRISMASATE) 1,500 mL/hr at 04/27/15 0008  . propofol (DIPRIVAN) infusion 5 mcg/kg/min (04/27/15 0124)  . vasopressin (PITRESSIN) infusion - *FOR SHOCK* 0.03 Units/min (04/27/15 0614)    PRN Medications: sodium chloride, sodium chloride, sodium chloride, acetaminophen, heparin, heparin, midazolam, ondansetron (ZOFRAN) IV, sodium chloride, sodium chloride flush, sodium chloride flush, sodium chloride flush   Assessment/Plan   Wesley Tyler is a 65 y.o. male sent to [facility] after PCP labs came back abnormal with mildly elevated troponin. Was found to be in florid HF and transferred to [facility] for further work up. He has no known medical history, as he has not seen an MD in > 40 years.  1. Acute systolic CHF/cardiogenic shock: Steadily worsening dyspnea over the last month, no clear trigger. Markedly worse 3/28, came to ER. EF 10% on bedside echo. Noted to be in atrial fibrillation and hypotensive, started initially on phenylephrine but transitioned over to dobutamine. No chest pain, slight troponin elevation is likely explained by cardiogenic shock/volume overload. Ddx for cardiomyopathy = coronary disease (?chronic) versus myocarditis versus tachy-mediated with atrial fibrillation of uncertain duration. Required Impella placement with low cardiac output on 3/29 (CI 1.3 by thermodilution on dobutamine).  He decompensated and was intubated, dobutamine  transitioned to milrinone and norepinephrine/epinephrine/vasopressin eventually added.  He is off epinephrine this morning.  Current co-ox 73% with CI 2.4.  PCWP 28.  Now on CVVH.   - Continue milrinone 0.125 mcg/kg/min (decreased with hypotension) and 42 of levophed. Coox 73% this am.  BP currently stable.  Will see if we can get him off vasopressin today (low SVR with concern for septic shock component may limit our ability to do this today).  - Filling pressures elevated, to begin UF gently this morning at 50 cc/hr.  Advance as BP tolerates. - Continue Impella support, will lower incrementally to P6 with hemolysis.  Can increase milrinone if output falls.  Good waveform, no alarms.  - If he recovers, will eventually need coronary angiography.  2. Acute hypoxic respiratory failure:  Remains intubated. Settings per CCM 3. AKI: Suspect ATN, cardiorenal/related to low output HF and hypotension. Currently on CVVH. 4. Atrial fibrillation: On heparin and amiodarone gtt for rate control.  Uncertain duration of atrial fibrillation, cannot rule out tachy-mediated CMP.  5. Endo: Elevated TSH and mildly elevated free T4. ?Sick euthyroid. Will follow.  6. Elevated LFTs: Suspect shock liver.  LFTs rose prior to amiodarone initiation.  AST/ALT improving but tbili rising consistent with hemolysis. 7. Hemolysis: Rising bilirubin 5 => 10.  Suspect due to Impella.  Hemoglobin still ok at 13.2 but coming down.  As above, turn down to P6.  8. ID: Fever 3/30, blood cultures send and on empiric vanco/Zosyn.   45 minutes critical care time.   Length of Stay: 2  Loralie Champagne 04/27/2015, 7:41 AM  Advanced Heart Failure Team Pager [phone] (M-F; 7a - 4p)  Please contact [facility] for night-coverage after hours (4p -7a ) and weekends on [email or web address]

## 2015-04-28 ENCOUNTER — Inpatient Hospital Stay (HOSPITAL_COMMUNITY): Payer: Medicare Other

## 2015-04-28 DIAGNOSIS — J810 Acute pulmonary edema: Secondary | ICD-10-CM

## 2015-04-28 LAB — MAGNESIUM: MAGNESIUM: 2.4 mg/dL (ref 1.7–2.4)

## 2015-04-28 LAB — CARBOXYHEMOGLOBIN
CARBOXYHEMOGLOBIN: 1.8 % — AB (ref 0.5–1.5)
METHEMOGLOBIN: 1.6 % — AB (ref 0.0–1.5)
O2 SAT: 77.1 %
TOTAL HEMOGLOBIN: 12.2 g/dL — AB (ref 13.5–18.0)

## 2015-04-28 LAB — BLOOD GAS, ARTERIAL
ACID-BASE DEFICIT: 1.7 mmol/L (ref 0.0–2.0)
Bicarbonate: 22.5 mEq/L (ref 20.0–24.0)
DRAWN BY: 301361
FIO2: 0.5
MECHVT: 620 mL
O2 Saturation: 99.3 %
PEEP: 8 cmH2O
PH ART: 7.392 (ref 7.350–7.450)
Patient temperature: 98.6
RATE: 18 resp/min
TCO2: 23.7 mmol/L (ref 0–100)
pCO2 arterial: 37.9 mmHg (ref 35.0–45.0)
pO2, Arterial: 165 mmHg — ABNORMAL HIGH (ref 80.0–100.0)

## 2015-04-28 LAB — CBC
HCT: 34.7 % — ABNORMAL LOW (ref 39.0–52.0)
Hemoglobin: 11.9 g/dL — ABNORMAL LOW (ref 13.0–17.0)
MCH: 30.8 pg (ref 26.0–34.0)
MCHC: 34.3 g/dL (ref 30.0–36.0)
MCV: 89.9 fL (ref 78.0–100.0)
PLATELETS: 152 10*3/uL (ref 150–400)
RBC: 3.86 MIL/uL — ABNORMAL LOW (ref 4.22–5.81)
RDW: 13.2 % (ref 11.5–15.5)
WBC: 21 10*3/uL — ABNORMAL HIGH (ref 4.0–10.5)

## 2015-04-28 LAB — GLUCOSE, CAPILLARY
GLUCOSE-CAPILLARY: 121 mg/dL — AB (ref 65–99)
GLUCOSE-CAPILLARY: 144 mg/dL — AB (ref 65–99)
GLUCOSE-CAPILLARY: 90 mg/dL (ref 65–99)
Glucose-Capillary: 124 mg/dL — ABNORMAL HIGH (ref 65–99)
Glucose-Capillary: 153 mg/dL — ABNORMAL HIGH (ref 65–99)
Glucose-Capillary: 134 mg/dL — ABNORMAL HIGH (ref 65–99)

## 2015-04-28 LAB — POCT ACTIVATED CLOTTING TIME
ACTIVATED CLOTTING TIME: 178 s
ACTIVATED CLOTTING TIME: 178 s
ACTIVATED CLOTTING TIME: 178 s
ACTIVATED CLOTTING TIME: 183 s
ACTIVATED CLOTTING TIME: 183 s
ACTIVATED CLOTTING TIME: 183 s
ACTIVATED CLOTTING TIME: 183 s
ACTIVATED CLOTTING TIME: 183 s
ACTIVATED CLOTTING TIME: 188 s
ACTIVATED CLOTTING TIME: 188 s
ACTIVATED CLOTTING TIME: 188 s
Activated Clotting Time: 178 seconds
Activated Clotting Time: 183 seconds
Activated Clotting Time: 178 seconds
Activated Clotting Time: 173 seconds
Activated Clotting Time: 173 seconds

## 2015-04-28 LAB — RENAL FUNCTION PANEL
ALBUMIN: 2 g/dL — AB (ref 3.5–5.0)
ANION GAP: 14 (ref 5–15)
BUN: 20 mg/dL (ref 6–20)
CALCIUM: 7.6 mg/dL — AB (ref 8.9–10.3)
CO2: 20 mmol/L — ABNORMAL LOW (ref 22–32)
Chloride: 101 mmol/L (ref 101–111)
Creatinine, Ser: 2.39 mg/dL — ABNORMAL HIGH (ref 0.61–1.24)
GFR calc Af Amer: 31 mL/min — ABNORMAL LOW (ref >60)
GFR, EST NON AFRICAN AMERICAN: 27 mL/min — AB (ref >60)
Glucose, Bld: 161 mg/dL — ABNORMAL HIGH (ref 65–99)
PHOSPHORUS: 3.3 mg/dL (ref 2.5–4.6)
POTASSIUM: 4.3 mmol/L (ref 3.5–5.1)
Sodium: 135 mmol/L (ref 135–145)

## 2015-04-28 LAB — COMPREHENSIVE METABOLIC PANEL
ALT: 364 U/L — ABNORMAL HIGH (ref 17–63)
ANION GAP: 10 (ref 5–15)
AST: 610 U/L — ABNORMAL HIGH (ref 15–41)
Albumin: 2.1 g/dL — ABNORMAL LOW (ref 3.5–5.0)
Alkaline Phosphatase: 103 U/L (ref 38–126)
BILIRUBIN TOTAL: 10.2 mg/dL — AB (ref 0.3–1.2)
BUN: 27 mg/dL — ABNORMAL HIGH (ref 6–20)
CHLORIDE: 102 mmol/L (ref 101–111)
CO2: 21 mmol/L — ABNORMAL LOW (ref 22–32)
Calcium: 7.8 mg/dL — ABNORMAL LOW (ref 8.9–10.3)
Creatinine, Ser: 2.48 mg/dL — ABNORMAL HIGH (ref 0.61–1.24)
GFR, EST AFRICAN AMERICAN: 30 mL/min — AB (ref >60)
GFR, EST NON AFRICAN AMERICAN: 26 mL/min — AB (ref >60)
Glucose, Bld: 133 mg/dL — ABNORMAL HIGH (ref 65–99)
POTASSIUM: 4.6 mmol/L (ref 3.5–5.1)
Sodium: 133 mmol/L — ABNORMAL LOW (ref 135–145)
TOTAL PROTEIN: 5.1 g/dL — AB (ref 6.5–8.1)

## 2015-04-28 LAB — APTT: APTT: 141 s — AB (ref 24–37)

## 2015-04-28 LAB — PHOSPHORUS: Phosphorus: 3.3 mg/dL (ref 2.5–4.6)

## 2015-04-28 LAB — TRIGLYCERIDES: TRIGLYCERIDES: 133 mg/dL (ref <150)

## 2015-04-28 MED ORDER — SODIUM CHLORIDE 0.9 % IV SOLN
25.0000 ug/h | INTRAVENOUS | Status: DC
  Administered 2015-04-28: 125 ug/h via INTRAVENOUS
  Administered 2015-04-29: 150 ug/h via INTRAVENOUS
  Administered 2015-04-30 (×2): 75 ug/h via INTRAVENOUS
  Administered 2015-05-01 – 2015-05-02 (×2): 125 ug/h via INTRAVENOUS
  Administered 2015-05-03: 150 ug/h via INTRAVENOUS
  Filled 2015-04-28 (×6): qty 50

## 2015-04-28 MED ORDER — MIDAZOLAM HCL 2 MG/2ML IJ SOLN
1.0000 mg | INTRAMUSCULAR | Status: DC | PRN
  Administered 2015-04-28 – 2015-05-03 (×25): 2 mg via INTRAVENOUS
  Filled 2015-04-28 (×25): qty 2

## 2015-04-28 NOTE — Progress Notes (Signed)
Subjective: Interval History: on vent , impella in place , 2 pressors, amio, milrinone, hep Objective: Vital signs in last 24 hours: Temp:  [96.1 F (35.6 C)-98.2 F (36.8 C)] 97.9 F (36.6 C) (04/01 0811) Pulse Rate:  [94-109] 104 (04/01 0811) Resp:  [14-25] 18 (04/01 0811) BP: (68-158)/(48-132) 113/69 mmHg (04/01 0811) SpO2:  [86 %-100 %] 93 % (04/01 0811) Arterial Line BP: (76-118)/(54-75) 110/68 mmHg (04/01 0800) FiO2 (%):  [40 %-50 %] 40 % (04/01 0811) Weight:  [97.9 kg (215 lb 13.3 oz)] 97.9 kg (215 lb 13.3 oz) (04/01 0344) Weight change: -0.2 kg (-7.1 oz)  Intake/Output from previous day: 03/31 0701 - 04/01 0700 In: 3247.2 [I.V.:2569.2; IV Piggyback:400] Out: 4014  Intake/Output this shift: Total I/O In: 38.4 [I.V.:26.7; Other:11.7] Out: 162 [Other:162]  General appearance: pale and sedated, but awake Resp: rales bilaterally and rhonchi bilaterally Cardio: S1, S2 normal and impella GI: few bs, mod distension Extremities: edema 3+ and R fem HD cath  Lab Results:  Recent Labs  04/27/15 1800 04/28/15 0332  WBC 21.8* 21.0*  HGB 12.1* 11.9*  HCT 35.3* 34.7*  PLT 159 152   BMET:  Recent Labs  04/27/15 1530 04/28/15 0332  NA 135 133*  K 4.7 4.6  CL 102 102  CO2 20* 21*  GLUCOSE 153* 133*  BUN 38* 27*  CREATININE 2.81* 2.48*  CALCIUM 7.5* 7.8*    Recent Labs  04/26/15 1405  PTH 411*   Iron Studies: No results for input(s): IRON, TIBC, TRANSFERRIN, FERRITIN in the last 72 hours.  Studies/Results: Dg Abd 1 View  04/27/2015  CLINICAL DATA:  Small bowel obstruction. EXAM: ABDOMEN - 1 VIEW COMPARISON:  04/27/2015 at 7:26 a.m. FINDINGS: Right femoral catheter unchanged. Nasogastric tube unchanged. Small caliber rectal catheter present. Air is present throughout the colon. Persistent air-filled bowel loop in the mid abdomen without significant change which may represent redundant sigmoid colon although cannot exclude small bowel. No free peritoneal air.  Remainder of the exam is unchanged. IMPRESSION: Stable air-filled bowel loop in the mid abdomen likely redundant sigmoid colon, although cannot exclude dilated small bowel. Recommend serial follow-up abdominal films. Tubes and lines as described. Electronically Signed   By: Elberta Fortis M.D.   On: 04/27/2015 19:17   Dg Abd 1 View  04/26/2015  CLINICAL DATA:  Enteric tube placement EXAM: ABDOMEN - 1 VIEW COMPARISON:  None. FINDINGS: Enteric tube terminates near the esophagogastric junction and should be advanced 5 cm. Left femoral approach Impella device terminates over the left ventricle. Right femoral approach central venous catheter terminates over the right common iliac vessel region. Right internal jugular Swan-Ganz catheter terminates over the right hilum. Dilated small bowel loops throughout the central abdomen. No evidence of pneumatosis or pneumoperitoneum. IMPRESSION: Enteric tube terminates near the esophagogastric junction and should be advanced 5 cm. Dilated small bowel loops throughout the central abdomen, either adynamic ileus or distal small bowel obstruction. These results were called by telephone at the time of interpretation on 04/26/2015 at 7:08 pm to RN [facility], who verbally acknowledged these results. Electronically Signed   By: Delbert Phenix M.D.   On: 04/26/2015 19:10   US Renal Port  04/26/2015  CLINICAL DATA:  Acute kidney injury EXAM: RENAL / URINARY TRACT ULTRASOUND COMPLETE COMPARISON:  None. FINDINGS: Right Kidney: Length: 14.9 cm. Echogenicity within normal limits. No solid mass or hydronephrosis visualized. Multiple cysts. Largest 66 x 49 x 59 mm, exophytic from the inferior pole. Adjacent 42 x 41 x  35 mm and 34 x 32 x 33 mm cysts medially at the inferior pole. Left Kidney: Length: 12.7 cm. Echogenicity within normal limits. No solid mass or hydronephrosis visualized. Adjacent upper pole cysts 4 x 3.8 x 3.7 cm and 4.1 x 3.2 x 3.8 cm. Lower pole cyst 24 x 26 x 23 mm.  Bladder: Decompressed by Foley catheter. There is a small amount of pelvic ascites noted. IMPRESSION: 1. Negative for hydronephrosis. 2. Bilateral renal cysts. 3. Small amount of pelvic ascites. Electronically Signed   By: Corlis Leak  Hassell M.D.   On: 04/26/2015 16:12   Dg Chest Port 1 View  04/28/2015  CLINICAL DATA:  Congestive heart failure. EXAM: PORTABLE CHEST 1 VIEW COMPARISON:  April 27, 2015. FINDINGS: Stable cardiomediastinal silhouette. Nasogastric and endotracheal tubes are unchanged in position. Right internal jugular Swan-Ganz catheter is again noted and unchanged with distal tip in lower lobe branch of right pulmonary artery. No pneumothorax is noted. Stable bilateral basilar opacities are noted concerning for edema or atelectasis with associated pleural effusions. Bony thorax is unremarkable. IMPRESSION: Stable support apparatus. Stable bilateral lung opacities as described above. Electronically Signed   By: Lupita RaiderJames  Green Jr, M.D.   On: 04/28/2015 07:31   Dg Chest Port 1 View  04/27/2015  CLINICAL DATA:  Hypoxia EXAM: PORTABLE CHEST 1 VIEW COMPARISON:  April 26, 2015 FINDINGS: Endotracheal tube tip is 5.1 cm above the carina. Nasogastric tube tip and side port are in the proximal stomach. Swan-Ganz catheter tip is in the proximal right lower lobe pulmonary artery, unchanged. No pneumothorax. There are bilateral pleural effusions with patchy bibasilar atelectasis. There is no frank edema or consolidation. Heart is slightly enlarged with pulmonary vascularity within normal limits. No adenopathy evident. IMPRESSION: Tube and catheter positions as described without pneumothorax. Note that the Swan-Ganz catheter tip is rather peripherally positioned in the proximal right lower lobe pulmonary artery. There are bilateral pleural effusions with bibasilar atelectasis. Stable cardiac prominence. No new opacity evident. Electronically Signed   By: Bretta BangWilliam  Woodruff III M.D.   On: 04/27/2015 07:55   Dg Chest Port  1 View  04/26/2015  CLINICAL DATA:  Verify orogastric tube placement. EXAM: PORTABLE CHEST 1 VIEW COMPARISON:  04/26/2015 at 4:34 a.m. FINDINGS: Orogastric tube tip projects at or just below the GE junction level. Recommend further inserting another 10-15 cm to allow the tip to more fully into the stomach. There is persistent hazy opacity at the lung bases consistent with pleural effusions. IMPRESSION: Orogastric tube tip projects at or just below the GE junction. Recommend further inserting 10-15 cm. Electronically Signed   By: Amie Portlandavid  Ormond M.D.   On: 04/26/2015 19:11   Dg Abd Portable 1v  04/27/2015  CLINICAL DATA:  Evaluate NG tube positioning EXAM: PORTABLE ABDOMEN - 1 VIEW COMPARISON:  04/26/2015; chest radiograph - 04/27/2015; 04/26/2015 FINDINGS: Interval advancement of enteric tube with tip projected over the distal esophagus. There is persistent marked gas distention of multiple centralized loops of rather featureless appearing small bowel with index loop within the left mid hemi abdomen measuring approximately 5.4 cm in diameter. There is minimal gaseous distension of the cecum and splenic flexure of the colon. No supine evidence of pneumoperitoneum. No definite pneumatosis or portal venous gas. Limited visualization of lower thorax demonstrates an enlarged cardiac silhouette. Presumed pacer leads overlying cardiac apex. Grossly unchanged infrahilar heterogeneous/consolidative opacities. Suspected trace bilateral effusions. The tip of a right femoral approach vascular sheath overlies the cranial aspect of the right sacral ala. No  acute osseus abnormalities. IMPRESSION: 1. Enteric tube tip side port projects over the distal esophagus. Advancement at least 9 cm is recommended. 2. Similar findings worrisome for at least partial small bowel obstruction. Continued attention on follow-up is recommended. Electronically Signed   By: Simonne Come M.D.   On: 04/27/2015 07:57    I have reviewed the patient's  current medications.  Assessment/Plan: 1 AKI oliguric ATN. Vol xs. PAD/PCW rising.  Try to lower. CRRT going well.acid/base/K ok. 2 CM per cards, still in schock, on impella,  3 Hemolysis Hb slowly falling, bili 10 4 Schock liver improving 5 Afib stable on amio/anticoag 6 ileus??  7 Resp failure P CRRT, neg 50, full support    LOS: 3 days   Halen Mossbarger L 04/28/2015,8:12 AM

## 2015-04-28 NOTE — Progress Notes (Signed)
PULMONARY / CRITICAL CARE MEDICINE   Name: Wesley Tyler Stockard MRN: [id number] DOB: 01/15/1951    ADMISSION DATE:  03/29/2015 CONSULTATION DATE:  04/12/2015  REFERRING MD:  EDP  CHIEF COMPLAINT:  SOB  HISTORY OF PRESENT ILLNESS:  Wesley Tyler Beckles is a 65 y.o. male with no known PMH.  He had not seen a health care provider since his 3920's and on 04/15/2015, his cousin made him go to an urgent care center to establish care and for further evaluation of occasional palpitations that pt had been experiencing on and off.  Palpitations had apparently worsened over the past 6 months and for the past 2 weeks, his heart had been continually beating "fast and hard".  He had not had any exacerbating or alleviating factors.  He has had associated severe SOB that is worse with exertion, increase in LE edema, PND and orthopnea.  Per cousin, pt sleeps in a chair sitting upright.   He has also had mild intermittent cough that was non productive.  Denies any fevers/chills/sweats, headaches, lightheadedness, chest pain, N/V/D, abd pain, myalgias.  At urgent care he was found to be in A.fib with RVR and was treated with lopressor and eliquis.  Lab work showed elevated BNP and troponin so pt was instructed to come to ED for further evaluation.  In ED, he had AKI with SCr 1.79 > 2.21 (unknown baseline), lactic acidosis (lactate 4.3), transaminitis, TSH 6.  PCCM was called for admission.   SUBJECTIVE:  BP still low.  On impella. On milrinone and levophed.  Epi drip weaned off.on CVVH. Bleeding per groin lines > heparin drip stopped.   VITAL SIGNS: BP 93/70 mmHg  Pulse 104  Temp(Src) 97.9 F (36.6 C) (Core (Comment))  Resp 18  Ht 6\' 4"  (1.93 m)  Wt 97.9 kg (215 lb 13.3 oz)  BMI 26.28 kg/m2  SpO2 100%  HEMODYNAMICS: PAP: (33-48)/(17-33) 42/30 mmHg CVP:  [12 mmHg-27 mmHg] 12 mmHg PCWP:  [17 mmHg-23 mmHg] 23 mmHg CO:  [4.8 L/min-5.6 L/min] 4.8 L/min CI:  [2.1 L/min/m2-2.5 L/min/m2] 2.1 L/min/m2  VENTILATOR  SETTINGS: Vent Mode:  [-] PRVC FiO2 (%):  [40 %-50 %] 50 % Set Rate:  [18 bmp] 18 bmp Vt Set:  [620 mL] 620 mL PEEP:  [5 cmH20-8 cmH20] 8 cmH20 Plateau Pressure:  [20 cmH20-22 cmH20] 20 cmH20  INTAKE / OUTPUT: I/O last 3 completed shifts: In: 4884.8 [I.V.:3918.5; Other:416.3; IV Piggyback:550] Out: 5516 [Urine:9; Other:5507]  PHYSICAL EXAMINATION: General: Caucasian male, in NAD. sedated Neuro: A&O x 3, CN grossly intact HEENT: [place]/AT. PERRL, sclerae anicteric. Cardiovascular: IRIR, no M/R/G.  Lungs: Respirations even and unlabored. Bibasilar crackles > more crackles  Abdomen: BS x 4, soft, NT/ND.  Musculoskeletal: No gross deformities, 3+ edema to knees. Skin: Intact, cool, no rashes.  LABS:  BMET  Recent Labs Lab 04/27/15 0430 04/27/15 1530 04/28/15 0332  NA 133* 135 133*  K 4.4 4.7 4.6  CL 103 102 102  CO2 19* 20* 21*  BUN 53* 38* 27*  CREATININE 3.57* 2.81* 2.48*  GLUCOSE 141* 153* 133*    Electrolytes  Recent Labs Lab 04/27/15 0430 04/27/15 1530 04/28/15 0332  CALCIUM 7.5* 7.5* 7.8*  MG 2.2 2.4 2.4  PHOS 4.5 3.9 3.3    CBC  Recent Labs Lab 04/27/15 0430 04/27/15 1800 04/28/15 0332  WBC 21.8* 21.8* 21.0*  HGB 13.2 12.1* 11.9*  HCT 37.9* 35.3* 34.7*  PLT 177 159 152    Coag's  Recent Labs Lab 04/27/15 0430 04/28/15 0332  APTT >200* 141*    Sepsis Markers  Recent Labs Lab 04/02/2015 0357 04/06/2015 0550 04/26/15 1433  LATICACIDVEN 4.53* 2.7* 3.0*  PROCALCITON  --  0.16  --     ABG  Recent Labs Lab 04/05/2015 0146 04/12/2015 2018 04/26/15 0405  PHART 7.459* 7.412 7.453*  PCO2ART 17.1* 33.9* 33.2*  PO2ART 457* 475.0* 201.0*    Liver Enzymes  Recent Labs Lab 04/26/15 0400  04/27/15 0430 04/27/15 1530 04/28/15 0332  AST 1472*  --  942*  --  610*  ALT 601*  --  451*  --  364*  ALKPHOS 114  --  103  --  103  BILITOT 5.4*  --  10.8*  --  10.2*  ALBUMIN 2.7*  < > 2.3* 2.1* 2.1*  < > = values in this interval not  displayed.  Cardiac Enzymes  Recent Labs Lab 08-May-2015 1630 03/31/2015 0550  TROPONINI 0.06* 0.10*    Glucose  Recent Labs Lab 04/27/15 1109 04/27/15 1538 04/27/15 2004 04/27/15 2347 04/28/15 0329 04/28/15 0713  GLUCAP 136* 139* 124* 121* 124* 90    Imaging Dg Abd 1 View  04/27/2015  CLINICAL DATA:  Small bowel obstruction. EXAM: ABDOMEN - 1 VIEW COMPARISON:  04/27/2015 at 7:26 a.m. FINDINGS: Right femoral catheter unchanged. Nasogastric tube unchanged. Small caliber rectal catheter present. Air is present throughout the colon. Persistent air-filled bowel loop in the mid abdomen without significant change which may represent redundant sigmoid colon although cannot exclude small bowel. No free peritoneal air. Remainder of the exam is unchanged. IMPRESSION: Stable air-filled bowel loop in the mid abdomen likely redundant sigmoid colon, although cannot exclude dilated small bowel. Recommend serial follow-up abdominal films. Tubes and lines as described. Electronically Signed   By: Elberta Fortis M.D.   On: 04/27/2015 19:17   Dg Chest Port 1 View  04/28/2015  CLINICAL DATA:  Congestive heart failure. EXAM: PORTABLE CHEST 1 VIEW COMPARISON:  April 27, 2015. FINDINGS: Stable cardiomediastinal silhouette. Nasogastric and endotracheal tubes are unchanged in position. Right internal jugular Swan-Ganz catheter is again noted and unchanged with distal tip in lower lobe branch of right pulmonary artery. No pneumothorax is noted. Stable bilateral basilar opacities are noted concerning for edema or atelectasis with associated pleural effusions. Bony thorax is unremarkable. IMPRESSION: Stable support apparatus. Stable bilateral lung opacities as described above. Electronically Signed   By: Lupita Raider, M.D.   On: 04/28/2015 07:31     STUDIES:  CXR 03/28 > pulm edema TEE (3/29) EF 5-10%, diffuse hypokinesis, mod AI and mod MR, severe TR   CULTURES: Blood 03/29 > Urine 03/29 > Sputum 03/29  >  ANTIBIOTICS: Zosyn 3/30 > Vanc 3/30 >   SIGNIFICANT EVENTS: 03/29 > admitted with A.fib, AKI, AGMA, transaminitis, hypothyroidism. Pt intubated for resp distress.EF 5-10% 3/30 > cvvh started  LINES/TUBES: CVL pending by EDP 03/29 >  DISCUSSION: 65 y.o. M with unknown PMH, brought to [facility] ED 03/29 after he was found to have A.fib with RVR at urgent care earlier in the day.  In ED, he was found to have AKI, AGMA, transaminitis, and hypothyroidism.  PCCM was asked to admit.  ASSESSMENT / PLAN:  PULMONARY A: Acute hypoxic respiratory failure 2/2 acute CHF exacerbation/pulm edema; R/O HCAP Former smoker. P:   Cont vent support. Not ready for wean given PEEP and hemodynamics.  See cards section See ID section ABG now and adjust vent accordingly.  CARDIOVASCULAR A:  Cardiogenic shock 2/2 Acute CHF exacerbation, EF 5-10%, CAD (  diffuse hypokinesis) S/P Impella (3/29) A.fib with RVR   P:  CHF team following. Appreciate input. Pt with swan catheter. PCWP 16 > 28 today; CI 2.5 Cont milrinone, levophed drip, vasopressin. Epi drip was weaned off Cont CVVH Heparin gtt, amiodarone drip > heparin drip held today 2/2 bleeding per groin D/C propofol to assist with hemodynamics.  RENAL A:   AKI - unknown baseline. Worsening renal fxn. AKI 2/2 ATN, cardio renal syndrome AGMA - lactate.  P:   CVVH Appreciate renal input.  Replace electrolytes as indicated. BMET in AM.  GASTROINTESTINAL A:   Transaminitis  2/2 Hepatic congestion fromCHF Ileus vs SBO P:   LFT's better Hold TF 2/2 ileus/SBO > rpt KUB later  HEMATOLOGIC A:   Hemoconcentration. VTE Prophylaxis. P:  SCD's / Heparin gtt > heparin drip held 2/2 to groin bleeding. CBC in AM.  INFECTIOUS A:   Leukocytosis with fever, dec SVR P:   Follow cultures as above. Vanc/zosyn.  ENDOCRINE A:   Hyperglycemia - no hx DM. Hypothyroidism.  P:   SSI. Synthroid - cont. Assess Hgb A1c, TSH.  NEUROLOGIC A:   No  acute issues. P:   D/C propofol. Fentanyl drip and Versed PRN.   Family updated: Cousin Shara Blazing) updated at bedside. Only family around. Pt is LCB, no cardioversion.   Interdisciplinary Family Meeting v Palliative Care Meeting:  Due by: 05/01/15.  The patient is critically ill with multiple organ systems failure and requires high complexity decision making for assessment and support, frequent evaluation and titration of therapies, application of advanced monitoring technologies and extensive interpretation of multiple databases.   Critical Care Time devoted to patient care services described in this note is  35  Minutes. This time reflects time of care of this signee Dr [name]. This critical care time does not reflect procedure time, or teaching time or supervisory time of PA/NP/Med student/Med Resident etc but could involve care discussion time.  Alyson Reedy, M.D. [facility] Pulmonary/Critical Care Medicine. Pager: [phone]. After hours pager: [phone].

## 2015-04-28 NOTE — Progress Notes (Signed)
OG tube placement verified via XRay & auscultation with Dr. Molli KnockYacoub; OK to start tube feeds per MD. Wilfred CurtisMilford, Nichola Cieslinski Marie

## 2015-04-28 NOTE — Progress Notes (Signed)
Advanced Heart Failure Rounding Note  PCP: Recently established with Iran Planas, PA-C - Family medicine (04/13/2015) Primary Cardiologist: New  Subjective:    Admitted 3/28 with cardiogenic shock.  He became critically worse with C.I. of 0.84 despite Impella and Dobutamine 1 on 3/29.  Dobutamine switched to milrinone as he was also getting amiodarone.  De-satted into 50% with profound respiratory fatigue and was intubated.   He developed progressive AKI and was started on CVVH.   Remains intubated and sedated but awakens to commands. On vasopressin, milrinone 0.125 and norepi 45. No urine output CVVH pulling 50/hr negative.   TEE (3/29):  EF 5-10% diffuse hypokinesis, dilated RV with severely decreased systolic function, moderate AI, moderate MR, severe TR. Spontaneous left to right bubbles but unable to discover source (not ASD or VSD, ?pulmonary AVMs).   Swan numbers CVP 12 PA 43/28 PCWP 23 Thermo CO/CI = 4.8/2.1  Impella P-6 2.5 L/min   Objective:   Weight Range: 97.9 kg (215 lb 13.3 oz) Body mass index is 26.28 kg/(m^2).   Vital Signs:   Temp:  [96.1 F (35.6 C)-98.2 F (36.8 C)] 97.9 F (36.6 C) (04/01 0900) Pulse Rate:  [94-109] 104 (04/01 0811) Resp:  [14-25] 18 (04/01 0900) BP: (68-158)/(48-132) 93/70 mmHg (04/01 0900) SpO2:  [92 %-100 %] 100 % (04/01 0900) Arterial Line BP: (76-118)/(54-75) 103/64 mmHg (04/01 0900) FiO2 (%):  [40 %-50 %] 50 % (04/01 0812) Weight:  [97.9 kg (215 lb 13.3 oz)] 97.9 kg (215 lb 13.3 oz) (04/01 0344) Last BM Date:  (PTA)  Weight change: Filed Weights   04/26/15 0500 04/27/15 0340 04/28/15 0344  Weight: 96.3 kg (212 lb 4.9 oz) 98.1 kg (216 lb 4.3 oz) 97.9 kg (215 lb 13.3 oz)    Intake/Output:   Intake/Output Summary (Last 24 hours) at 04/28/15 1018 Last data filed at 04/28/15 1000  Gross per 24 hour  Intake 3154.7 ml  Output   4079 ml  Net -924.3 ml     Physical Exam: General:  Intubated, sedated. Will awaken and  follow commands HEENT: normal Neck: supple. Carotids 2+ bilat; no bruits. No thyromegaly or lymphadenopathy.  Cor: PMI displaced laterally. Irregular rate & rhythm. +S3 Lungs: Mechanical breath sounds Abdomen: soft, NT, ND, no HSM. No bruits or masses. +BS  Extremities: warm  LLE. 2-3+ edema into thighs. Unable to palpate pedal pulses. Left groin ecchymosis. Impella sheath ok.  Neuro: Intubated, sedated.  Telemetry: Reviewed, Afib 100-120s  Labs: CBC  Recent Labs  04/05/2015 1530  04/26/15 1405  04/27/15 1800 04/28/15 0332  WBC 12.6*  < > 24.3*  < > 21.8* 21.0*  NEUTROABS 10.6*  --  20.1*  --   --   --   HGB 15.1  < > 14.3  < > 12.1* 11.9*  HCT 44.7  < > 41.2  < > 35.3* 34.7*  MCV 92.9  < > 90.5  < > 90.1 89.9  PLT 217  < > 209  < > 159 152  < > = values in this interval not displayed. Basic Metabolic Panel  Recent Labs  04/27/15 1530 04/28/15 0332  NA 135 133*  K 4.7 4.6  CL 102 102  CO2 20* 21*  GLUCOSE 153* 133*  BUN 38* 27*  CREATININE 2.81* 2.48*  CALCIUM 7.5* 7.8*  MG 2.4 2.4  PHOS 3.9 3.3   Liver Function Tests  Recent Labs  04/27/15 0430 04/27/15 1530 04/28/15 0332  AST 942*  --  610*  ALT 451*  --  364*  ALKPHOS 103  --  103  BILITOT 10.8*  --  10.2*  PROT 5.1*  --  5.1*  ALBUMIN 2.3* 2.1* 2.1*   No results for input(s): LIPASE, AMYLASE in the last 72 hours. Cardiac Enzymes  Recent Labs  04/26/15 1433  CKTOTAL 1041*    BNP: BNP (last 3 results)  Recent Labs  04/13/2015 2338  BNP 612.8*    ProBNP (last 3 results) No results for input(s): PROBNP in the last 8760 hours.   D-Dimer No results for input(s): DDIMER in the last 72 hours. Hemoglobin A1C  Recent Labs  04/26/15 0400  HGBA1C 6.6*   Fasting Lipid Panel  Recent Labs  04/26/15 0400  CHOL 101  HDL 26*  LDLCALC 65  TRIG 52  CHOLHDL 3.9   Thyroid Function Tests No results for input(s): TSH, T4TOTAL, T3FREE, THYROIDAB in the last 72 hours.  Invalid input(s):  FREET3  Other results:     Imaging/Studies:  Dg Abd 1 View  04/27/2015  CLINICAL DATA:  Small bowel obstruction. EXAM: ABDOMEN - 1 VIEW COMPARISON:  04/27/2015 at 7:26 a.m. FINDINGS: Right femoral catheter unchanged. Nasogastric tube unchanged. Small caliber rectal catheter present. Air is present throughout the colon. Persistent air-filled bowel loop in the mid abdomen without significant change which may represent redundant sigmoid colon although cannot exclude small bowel. No free peritoneal air. Remainder of the exam is unchanged. IMPRESSION: Stable air-filled bowel loop in the mid abdomen likely redundant sigmoid colon, although cannot exclude dilated small bowel. Recommend serial follow-up abdominal films. Tubes and lines as described. Electronically Signed   By: Marin Olp M.D.   On: 04/27/2015 19:17   Dg Abd 1 View  04/26/2015  CLINICAL DATA:  Enteric tube placement EXAM: ABDOMEN - 1 VIEW COMPARISON:  None. FINDINGS: Enteric tube terminates near the esophagogastric junction and should be advanced 5 cm. Left femoral approach Impella device terminates over the left ventricle. Right femoral approach central venous catheter terminates over the right common iliac vessel region. Right internal jugular Swan-Ganz catheter terminates over the right hilum. Dilated small bowel loops throughout the central abdomen. No evidence of pneumatosis or pneumoperitoneum. IMPRESSION: Enteric tube terminates near the esophagogastric junction and should be advanced 5 cm. Dilated small bowel loops throughout the central abdomen, either adynamic ileus or distal small bowel obstruction. These results were called by telephone at the time of interpretation on 04/26/2015 at 7:08 pm to RN [facility], who verbally acknowledged these results. Electronically Signed   By: Ilona Sorrel M.D.   On: 04/26/2015 19:10   US Renal Port  04/26/2015  CLINICAL DATA:  Acute kidney injury EXAM: RENAL / URINARY TRACT ULTRASOUND  COMPLETE COMPARISON:  None. FINDINGS: Right Kidney: Length: 14.9 cm. Echogenicity within normal limits. No solid mass or hydronephrosis visualized. Multiple cysts. Largest 66 x 49 x 59 mm, exophytic from the inferior pole. Adjacent 42 x 41 x 35 mm and 34 x 32 x 33 mm cysts medially at the inferior pole. Left Kidney: Length: 12.7 cm. Echogenicity within normal limits. No solid mass or hydronephrosis visualized. Adjacent upper pole cysts 4 x 3.8 x 3.7 cm and 4.1 x 3.2 x 3.8 cm. Lower pole cyst 24 x 26 x 23 mm. Bladder: Decompressed by Foley catheter. There is a small amount of pelvic ascites noted. IMPRESSION: 1. Negative for hydronephrosis. 2. Bilateral renal cysts. 3. Small amount of pelvic ascites. Electronically Signed   By: Eden Emms.D.  On: 04/26/2015 16:12   Dg Chest Port 1 View  04/28/2015  CLINICAL DATA:  Congestive heart failure. EXAM: PORTABLE CHEST 1 VIEW COMPARISON:  April 27, 2015. FINDINGS: Stable cardiomediastinal silhouette. Nasogastric and endotracheal tubes are unchanged in position. Right internal jugular Swan-Ganz catheter is again noted and unchanged with distal tip in lower lobe branch of right pulmonary artery. No pneumothorax is noted. Stable bilateral basilar opacities are noted concerning for edema or atelectasis with associated pleural effusions. Bony thorax is unremarkable. IMPRESSION: Stable support apparatus. Stable bilateral lung opacities as described above. Electronically Signed   By: Marijo Conception, M.D.   On: 04/28/2015 07:31   Dg Chest Port 1 View  04/27/2015  CLINICAL DATA:  Hypoxia EXAM: PORTABLE CHEST 1 VIEW COMPARISON:  April 26, 2015 FINDINGS: Endotracheal tube tip is 5.1 cm above the carina. Nasogastric tube tip and side port are in the proximal stomach. Swan-Ganz catheter tip is in the proximal right lower lobe pulmonary artery, unchanged. No pneumothorax. There are bilateral pleural effusions with patchy bibasilar atelectasis. There is no frank edema or  consolidation. Heart is slightly enlarged with pulmonary vascularity within normal limits. No adenopathy evident. IMPRESSION: Tube and catheter positions as described without pneumothorax. Note that the Swan-Ganz catheter tip is rather peripherally positioned in the proximal right lower lobe pulmonary artery. There are bilateral pleural effusions with bibasilar atelectasis. Stable cardiac prominence. No new opacity evident. Electronically Signed   By: Lowella Grip III M.D.   On: 04/27/2015 07:55   Dg Chest Port 1 View  04/26/2015  CLINICAL DATA:  Verify orogastric tube placement. EXAM: PORTABLE CHEST 1 VIEW COMPARISON:  04/26/2015 at 4:34 a.m. FINDINGS: Orogastric tube tip projects at or just below the GE junction level. Recommend further inserting another 10-15 cm to allow the tip to more fully into the stomach. There is persistent hazy opacity at the lung bases consistent with pleural effusions. IMPRESSION: Orogastric tube tip projects at or just below the GE junction. Recommend further inserting 10-15 cm. Electronically Signed   By: Lajean Manes M.D.   On: 04/26/2015 19:11   Dg Abd Portable 1v  04/27/2015  CLINICAL DATA:  Evaluate NG tube positioning EXAM: PORTABLE ABDOMEN - 1 VIEW COMPARISON:  04/26/2015; chest radiograph - 04/27/2015; 04/26/2015 FINDINGS: Interval advancement of enteric tube with tip projected over the distal esophagus. There is persistent marked gas distention of multiple centralized loops of rather featureless appearing small bowel with index loop within the left mid hemi abdomen measuring approximately 5.4 cm in diameter. There is minimal gaseous distension of the cecum and splenic flexure of the colon. No supine evidence of pneumoperitoneum. No definite pneumatosis or portal venous gas. Limited visualization of lower thorax demonstrates an enlarged cardiac silhouette. Presumed pacer leads overlying cardiac apex. Grossly unchanged infrahilar heterogeneous/consolidative opacities.  Suspected trace bilateral effusions. The tip of a right femoral approach vascular sheath overlies the cranial aspect of the right sacral ala. No acute osseus abnormalities. IMPRESSION: 1. Enteric tube tip side port projects over the distal esophagus. Advancement at least 9 cm is recommended. 2. Similar findings worrisome for at least partial small bowel obstruction. Continued attention on follow-up is recommended. Electronically Signed   By: Sandi Mariscal M.D.   On: 04/27/2015 07:57    Latest Echo  Latest Cath   Medications:     Scheduled Medications: . antiseptic oral rinse  7 mL Mouth Rinse 10 times per day  . chlorhexidine gluconate (SAGE KIT)  15 mL Mouth Rinse BID  .  feeding supplement (PRO-STAT SUGAR FREE 64)  30 mL Per Tube TID  . insulin aspart  0-15 Units Subcutaneous 6 times per day  . pantoprazole (PROTONIX) IV  40 mg Intravenous Q24H  . piperacillin-tazobactam (ZOSYN)  IV  3.375 g Intravenous 4 times per day  . sodium chloride flush  10-40 mL Intracatheter Q12H  . sodium chloride flush  3 mL Intravenous Q12H  . sodium chloride flush  3 mL Intravenous Q12H  . vancomycin  1,000 mg Intravenous Q24H    Infusions: . sodium chloride Stopped (04/11/2015 1041)  . amiodarone 30 mg/hr (04/28/15 0700)  . epinephrine Stopped (04/26/15 2230)  . feeding supplement (VITAL 1.5 CAL) 1,000 mL (04/28/15 1016)  . fentaNYL infusion INTRAVENOUS 100 mcg/hr (04/28/15 0700)  . impella catheter heparin 50 unit/mL in dextrose 5%    . heparin 900 Units/hr (04/28/15 0905)  . milrinone 0.125 mcg/kg/min (04/28/15 0700)  . norepinephrine (LEVOPHED) Adult infusion 45 mcg/min (04/28/15 1011)  . dialysis replacement fluid (prismasate) 700 mL/hr at 04/28/15 0602  . dialysis replacement fluid (prismasate) 700 mL/hr at 04/28/15 0607  . dialysate (PRISMASATE) 2,000 mL/hr at 04/28/15 0904  . propofol (DIPRIVAN) infusion 4.955 mcg/kg/min (04/28/15 0700)  . vasopressin (PITRESSIN) infusion - *FOR SHOCK* 0.03  Units/min (04/28/15 0911)    PRN Medications: sodium chloride, sodium chloride, sodium chloride, acetaminophen, heparin, heparin, midazolam, ondansetron (ZOFRAN) IV, sodium chloride, sodium chloride flush, sodium chloride flush, sodium chloride flush   Assessment/Plan   Wesley Tyler is a 65 y.o. male sent to [facility] after PCP labs came back abnormal with mildly elevated troponin. Was found to be in florid HF and transferred to [facility] for further work up. He has no known medical history, as he has not seen an MD in > 40 years.  1. Acute systolic CHF/cardiogenic shock: Steadily worsening dyspnea over the last month, no clear trigger. Markedly worse 3/28, came to ER. EF 10% on bedside echo. Noted to be in atrial fibrillation and hypotensive, started initially on phenylephrine but transitioned over to dobutamine. No chest pain, slight troponin elevation is likely explained by cardiogenic shock/volume overload. Ddx for cardiomyopathy = coronary disease (?chronic) versus myocarditis versus tachy-mediated with atrial fibrillation of uncertain duration. Required Impella placement with low cardiac output on 3/29 (CI 1.3 by thermodilution on dobutamine).  He decompensated and was intubated. Remains on norepi, milrinone and vasopressin. Co-ox 77% - Continue milrinone 0.125 mcg/kg/min (decreased with hypotension) and 42 of levophed. Coox 73% this am.  BP currently stable.  Will wean norepi a bit and then see if we can get him off vasopressin  (low SVR with concern for septic shock component may limit our ability to do this today).  - Filling pressures elevated, continue UF - pull as BP tolerates. Renal on Board. .  - Continue Impella support, will lower incrementally to P5 with hemolysis.  Can increase milrinone if output falls.  Good waveform, no alarms. Position ok on echo. - If he recovers, will eventually need coronary angiography.  2. Acute hypoxic respiratory failure:  Remains intubated. Settings per  CCM 3. AKI: Suspect ATN, cardiorenal/related to low output HF and hypotension. Currently on CVVH. 4. Atrial fibrillation: On heparin and amiodarone gtt for rate control.  Uncertain duration of atrial fibrillation, cannot rule out tachy-mediated CMP.  5. Endo: Elevated TSH and mildly elevated free T4. ?Sick euthyroid. Will follow.  6. Elevated LFTs: Suspect shock liver.  LFTs rose prior to amiodarone initiation.  AST/ALT improving but tbili up consistent with hemolysis. Suspect  shock liver component as well  7. Hemolysis: Bilirubin  Down slight but still around 10.  Suspect due to Impella.  Hemoglobin coming down slwoly. Will decrease Impella to P-5 8. ID probable sepsis: Fever 3/30, blood cultures sen. On empiric vanco/Zosyn. Cultures NGTD today.   The patient is critically ill with multiple organ systems failure and requires high complexity decision making for assessment and support, frequent evaluation and titration of therapies, application of advanced monitoring technologies and extensive interpretation of multiple databases.   Critical Care Time devoted to patient care services described in this note is 35 Minutes.    Length of Stay: 3  Khali Perella 04/28/2015, 10:18 AM  Advanced Heart Failure Team Pager [phone] (M-F; [place])  Please contact [facility] for night-coverage after hours (4p -7a ) and weekends on [email or web address]

## 2015-04-28 NOTE — Progress Notes (Signed)
RT walked into room to complete a vent check and patient was rolled onto his side being cleaned up by his nurse. Patients O2 sat decreased to the 70's and RT delivered patient a few O2 breaths only getting sats back up to the mid 80's. Once patient was lying flat again RT noticed tube was only 21 @ the lip. RT advanced his tube to 25 @ the lip and upon listening for breath sounds RT could hear breath sounds in the abdomen so RT pulled tube back to 23 @ the lip and patients O2 sats came back up to the 90's. RT will continue to monitor.

## 2015-04-28 DEATH — deceased

## 2015-04-29 ENCOUNTER — Inpatient Hospital Stay (HOSPITAL_COMMUNITY): Payer: Medicare Other

## 2015-04-29 DIAGNOSIS — I480 Paroxysmal atrial fibrillation: Secondary | ICD-10-CM

## 2015-04-29 DIAGNOSIS — G9340 Encephalopathy, unspecified: Secondary | ICD-10-CM

## 2015-04-29 LAB — GLUCOSE, CAPILLARY
GLUCOSE-CAPILLARY: 174 mg/dL — AB (ref 65–99)
GLUCOSE-CAPILLARY: 151 mg/dL — AB (ref 65–99)
GLUCOSE-CAPILLARY: 125 mg/dL — AB (ref 65–99)
GLUCOSE-CAPILLARY: 118 mg/dL — AB (ref 65–99)
Glucose-Capillary: 139 mg/dL — ABNORMAL HIGH (ref 65–99)
Glucose-Capillary: 169 mg/dL — ABNORMAL HIGH (ref 65–99)
Glucose-Capillary: 208 mg/dL — ABNORMAL HIGH (ref 65–99)

## 2015-04-29 LAB — RENAL FUNCTION PANEL
ALBUMIN: 2.1 g/dL — AB (ref 3.5–5.0)
ANION GAP: 12 (ref 5–15)
BUN: 17 mg/dL (ref 6–20)
CALCIUM: 7.8 mg/dL — AB (ref 8.9–10.3)
CO2: 21 mmol/L — ABNORMAL LOW (ref 22–32)
Chloride: 101 mmol/L (ref 101–111)
Creatinine, Ser: 2.03 mg/dL — ABNORMAL HIGH (ref 0.61–1.24)
GFR, EST AFRICAN AMERICAN: 38 mL/min — AB (ref >60)
GFR, EST NON AFRICAN AMERICAN: 33 mL/min — AB (ref >60)
Glucose, Bld: 214 mg/dL — ABNORMAL HIGH (ref 65–99)
PHOSPHORUS: 2.4 mg/dL — AB (ref 2.5–4.6)
POTASSIUM: 3.9 mmol/L (ref 3.5–5.1)
SODIUM: 134 mmol/L — AB (ref 135–145)

## 2015-04-29 LAB — BLOOD GAS, ARTERIAL
Acid-base deficit: 1.9 mmol/L (ref 0.0–2.0)
Bicarbonate: 22.8 mEq/L (ref 20.0–24.0)
DRAWN BY: 365271
FIO2: 0.5
O2 Saturation: 99.1 %
PEEP: 8 cmH2O
PH ART: 7.358 (ref 7.350–7.450)
Patient temperature: 98.6
RATE: 18 resp/min
TCO2: 24.1 mmol/L (ref 0–100)
VT: 620 mL
pCO2 arterial: 41.5 mmHg (ref 35.0–45.0)
pO2, Arterial: 158 mmHg — ABNORMAL HIGH (ref 80.0–100.0)

## 2015-04-29 LAB — BASIC METABOLIC PANEL
Anion gap: 9 (ref 5–15)
BUN: 18 mg/dL (ref 6–20)
CHLORIDE: 104 mmol/L (ref 101–111)
CO2: 23 mmol/L (ref 22–32)
Calcium: 7.8 mg/dL — ABNORMAL LOW (ref 8.9–10.3)
Creatinine, Ser: 2.08 mg/dL — ABNORMAL HIGH (ref 0.61–1.24)
GFR calc Af Amer: 37 mL/min — ABNORMAL LOW (ref >60)
GFR calc non Af Amer: 32 mL/min — ABNORMAL LOW (ref >60)
Glucose, Bld: 183 mg/dL — ABNORMAL HIGH (ref 65–99)
POTASSIUM: 4.1 mmol/L (ref 3.5–5.1)
Sodium: 136 mmol/L (ref 135–145)

## 2015-04-29 LAB — POCT ACTIVATED CLOTTING TIME
ACTIVATED CLOTTING TIME: 157 s
ACTIVATED CLOTTING TIME: 157 s
ACTIVATED CLOTTING TIME: 167 s
ACTIVATED CLOTTING TIME: 167 s
ACTIVATED CLOTTING TIME: 168 s
ACTIVATED CLOTTING TIME: 173 s
Activated Clotting Time: 167 seconds
Activated Clotting Time: 162 seconds
Activated Clotting Time: 162 seconds

## 2015-04-29 LAB — POCT I-STAT 3, ART BLOOD GAS (G3+)
Acid-Base Excess: 1 mmol/L (ref 0.0–2.0)
BICARBONATE: 25.3 meq/L — AB (ref 20.0–24.0)
O2 Saturation: 99 %
PCO2 ART: 38.9 mmHg (ref 35.0–45.0)
PO2 ART: 149 mmHg — AB (ref 80.0–100.0)
TCO2: 26 mmol/L (ref 0–100)
pH, Arterial: 7.422 (ref 7.350–7.450)

## 2015-04-29 LAB — PHOSPHORUS: Phosphorus: 3 mg/dL (ref 2.5–4.6)

## 2015-04-29 LAB — CARBOXYHEMOGLOBIN
Carboxyhemoglobin: 1.2 % (ref 0.5–1.5)
Methemoglobin: 1.6 % — ABNORMAL HIGH (ref 0.0–1.5)
O2 Saturation: 76.3 %
TOTAL HEMOGLOBIN: 11.6 g/dL — AB (ref 13.5–18.0)

## 2015-04-29 LAB — CBC
HEMATOCRIT: 35.3 % — AB (ref 39.0–52.0)
HEMOGLOBIN: 11.8 g/dL — AB (ref 13.0–17.0)
MCH: 30.3 pg (ref 26.0–34.0)
MCHC: 33.4 g/dL (ref 30.0–36.0)
MCV: 90.7 fL (ref 78.0–100.0)
Platelets: 120 10*3/uL — ABNORMAL LOW (ref 150–400)
RBC: 3.89 MIL/uL — AB (ref 4.22–5.81)
RDW: 13.7 % (ref 11.5–15.5)
WBC: 16.9 10*3/uL — ABNORMAL HIGH (ref 4.0–10.5)

## 2015-04-29 LAB — APTT: APTT: 69 s — AB (ref 24–37)

## 2015-04-29 LAB — CULTURE, RESPIRATORY W GRAM STAIN

## 2015-04-29 LAB — LACTATE DEHYDROGENASE: LDH: 1656 U/L — AB (ref 98–192)

## 2015-04-29 LAB — CULTURE, RESPIRATORY

## 2015-04-29 LAB — MAGNESIUM: MAGNESIUM: 2.5 mg/dL — AB (ref 1.7–2.4)

## 2015-04-29 NOTE — Progress Notes (Signed)
Advanced Heart Failure Rounding Note  PCP: Recently established with Iran Planas, PA-C - Family medicine (04/22/2015) Primary Cardiologist: New  Subjective:    Admitted 3/28 with cardiogenic shock.  He became critically worse with C.I. of 0.84 despite Impella and Dobutamine 1 on 3/29.  Dobutamine switched to milrinone as he was also getting amiodarone.  De-satted into 50% with profound respiratory fatigue and was intubated.   He developed progressive AKI and was started on CVVH.   Remains intubated and sedated but awakens to commands. On vasopressin, milrinone 0.125 and norepi 45 -> 29. No urine output CVVH pulling 75/hr negative.   Co-ox 76%. LDH 1656 but coming down.  PLTs down to 120k   TEE (3/29):  EF 5-10% diffuse hypokinesis, dilated RV with severely decreased systolic function, moderate AI, moderate MR, severe TR. Spontaneous left to right bubbles but unable to discover source (not ASD or VSD, ?pulmonary AVMs).   Swan numbers CVP 15 PA 50/25 PCWP 22 Thermo CO/CI = 4.6/2.0 SVR 1115  Impella P-5 2.5 L/min   Objective:   Weight Range: 95.5 kg (210 lb 8.6 oz) Body mass index is 25.64 kg/(m^2).   Vital Signs:   Temp:  [97.5 F (36.4 C)-98.1 F (36.7 C)] 97.9 F (36.6 C) (04/02 1522) Pulse Rate:  [92-113] 96 (04/02 1522) Resp:  [13-22] 18 (04/02 1522) BP: (94-111)/(57-72) 111/65 mmHg (04/02 1522) SpO2:  [91 %-100 %] 97 % (04/02 1522) Arterial Line BP: (94-115)/(61-74) 108/66 mmHg (04/02 1500) FiO2 (%):  [40 %-50 %] 40 % (04/02 1522) Weight:  [95.5 kg (210 lb 8.6 oz)] 95.5 kg (210 lb 8.6 oz) (04/02 0500) Last BM Date: 04/29/15  Weight change: Filed Weights   04/27/15 0340 04/28/15 0344 04/29/15 0500  Weight: 98.1 kg (216 lb 4.3 oz) 97.9 kg (215 lb 13.3 oz) 95.5 kg (210 lb 8.6 oz)    Intake/Output:   Intake/Output Summary (Last 24 hours) at 04/29/15 1535 Last data filed at 04/29/15 1507  Gross per 24 hour  Intake 4377.58 ml  Output   5622 ml  Net  -1244.42 ml     Physical Exam: General:  Intubated, sedated. Will awaken and follow commands HEENT: normal Neck: supple. Carotids 2+ bilat; no bruits. No thyromegaly or lymphadenopathy.  Cor: PMI displaced laterally. Irregular rate & rhythm. +S3 Lungs: Mechanical breath sounds Abdomen: soft, NT, ND, no HSM. No bruits or masses. +BS  Extremities: warm  LLE. 3+ edema into thighs. Unable to palpate pedal pulses. Left groin ecchymosis. Impella sheath ok.  Neuro: Intubated, sedated.  Telemetry: Reviewed, Afib 100-110s  Labs: CBC  Recent Labs  04/28/15 0332 04/29/15 0411  WBC 21.0* 16.9*  HGB 11.9* 11.8*  HCT 34.7* 35.3*  MCV 89.9 90.7  PLT 152 606*   Basic Metabolic Panel  Recent Labs  04/28/15 0332 04/28/15 1607 04/29/15 0411  NA 133* 135 136  K 4.6 4.3 4.1  CL 102 101 104  CO2 21* 20* 23  GLUCOSE 133* 161* 183*  BUN 27* 20 18  CREATININE 2.48* 2.39* 2.08*  CALCIUM 7.8* 7.6* 7.8*  MG 2.4  --  2.5*  PHOS 3.3 3.3 3.0   Liver Function Tests  Recent Labs  04/27/15 0430  04/28/15 0332 04/28/15 1607  AST 942*  --  610*  --   ALT 451*  --  364*  --   ALKPHOS 103  --  103  --   BILITOT 10.8*  --  10.2*  --   PROT 5.1*  --  5.1*  --   ALBUMIN 2.3*  < > 2.1* 2.0*  < > = values in this interval not displayed. No results for input(s): LIPASE, AMYLASE in the last 72 hours. Cardiac Enzymes No results for input(s): CKTOTAL, CKMB, CKMBINDEX, TROPONINI in the last 72 hours.  BNP: BNP (last 3 results)  Recent Labs  04/23/2015 2338  BNP 612.8*    ProBNP (last 3 results) No results for input(s): PROBNP in the last 8760 hours.   D-Dimer No results for input(s): DDIMER in the last 72 hours. Hemoglobin A1C No results for input(s): HGBA1C in the last 72 hours. Fasting Lipid Panel  Recent Labs  04/28/15 1953  TRIG 133   Thyroid Function Tests No results for input(s): TSH, T4TOTAL, T3FREE, THYROIDAB in the last 72 hours.  Invalid input(s): FREET3  Other  results:     Imaging/Studies:  Dg Abd 1 View  04/27/2015  CLINICAL DATA:  Small bowel obstruction. EXAM: ABDOMEN - 1 VIEW COMPARISON:  04/27/2015 at 7:26 a.m. FINDINGS: Right femoral catheter unchanged. Nasogastric tube unchanged. Small caliber rectal catheter present. Air is present throughout the colon. Persistent air-filled bowel loop in the mid abdomen without significant change which may represent redundant sigmoid colon although cannot exclude small bowel. No free peritoneal air. Remainder of the exam is unchanged. IMPRESSION: Stable air-filled bowel loop in the mid abdomen likely redundant sigmoid colon, although cannot exclude dilated small bowel. Recommend serial follow-up abdominal films. Tubes and lines as described. Electronically Signed   By: Marin Olp M.D.   On: 04/27/2015 19:17   Dg Chest Port 1 View  04/29/2015  CLINICAL DATA:  Congestive heart failure. EXAM: PORTABLE CHEST 1 VIEW COMPARISON:  04/28/2015 FINDINGS: An endotracheal tube with tip 8 cm above the carina, right IJ Swan-Ganz catheter with tip overlying the right lower lobe pulmonary artery, NG tube with tip overlying the mid stomach again noted. Cardiomegaly and pulmonary vascular congestion with bilateral lower lung atelectasis/consolidation again noted. There is no evidence of pneumothorax. Probable pulmonary effusions again noted. Mildly distended gas-filled colon is identified. IMPRESSION: Unchanged appearance of the chest with point vascular congestion and bilateral lower lung atelectasis/ consolidation with small effusions. Mildly distended gas-filled colon now identified. Electronically Signed   By: Margarette Canada M.D.   On: 04/29/2015 07:16   Dg Chest Port 1 View  04/28/2015  CLINICAL DATA:  Congestive heart failure. EXAM: PORTABLE CHEST 1 VIEW COMPARISON:  April 27, 2015. FINDINGS: Stable cardiomediastinal silhouette. Nasogastric and endotracheal tubes are unchanged in position. Right internal jugular Swan-Ganz  catheter is again noted and unchanged with distal tip in lower lobe branch of right pulmonary artery. No pneumothorax is noted. Stable bilateral basilar opacities are noted concerning for edema or atelectasis with associated pleural effusions. Bony thorax is unremarkable. IMPRESSION: Stable support apparatus. Stable bilateral lung opacities as described above. Electronically Signed   By: Marijo Conception, M.D.   On: 04/28/2015 07:31    Latest Echo  Latest Cath   Medications:     Scheduled Medications: . antiseptic oral rinse  7 mL Mouth Rinse 10 times per day  . chlorhexidine gluconate (SAGE KIT)  15 mL Mouth Rinse BID  . feeding supplement (PRO-STAT SUGAR FREE 64)  30 mL Per Tube TID  . insulin aspart  0-15 Units Subcutaneous 6 times per day  . pantoprazole (PROTONIX) IV  40 mg Intravenous Q24H  . piperacillin-tazobactam (ZOSYN)  IV  3.375 g Intravenous 4 times per day  . sodium chloride flush  10-40 mL Intracatheter Q12H  . sodium chloride flush  3 mL Intravenous Q12H  . sodium chloride flush  3 mL Intravenous Q12H  . vancomycin  1,000 mg Intravenous Q24H    Infusions: . sodium chloride Stopped (04/27/2015 1041)  . amiodarone 30 mg/hr (04/29/15 1220)  . epinephrine Stopped (04/26/15 2230)  . feeding supplement (VITAL 1.5 CAL) 1,000 mL (04/29/15 0918)  . fentaNYL infusion INTRAVENOUS 150 mcg/hr (04/29/15 1419)  . impella catheter heparin 50 unit/mL in dextrose 5%    . heparin 700 Units/hr (04/29/15 0700)  . milrinone 0.125 mcg/kg/min (04/29/15 1110)  . norepinephrine (LEVOPHED) Adult infusion 29 mcg/min (04/29/15 1126)  . dialysis replacement fluid (prismasate) 700 mL/hr at 04/29/15 1125  . dialysis replacement fluid (prismasate) 700 mL/hr at 04/29/15 1132  . dialysate (PRISMASATE) 2,000 mL/hr at 04/29/15 1343  . vasopressin (PITRESSIN) infusion - *FOR SHOCK* 0.03 Units/min (04/29/15 0700)    PRN Medications: sodium chloride, sodium chloride, sodium chloride, acetaminophen,  heparin, heparin, midazolam, ondansetron (ZOFRAN) IV, sodium chloride, sodium chloride flush, sodium chloride flush, sodium chloride flush   Assessment/Plan   Wesley Tyler is a 65 y.o. male sent to [facility] after PCP labs came back abnormal with mildly elevated troponin. Was found to be in florid HF and transferred to [facility] for further work up. He has no known medical history, as he has not seen an MD in > 40 years.  1. Acute systolic CHF/cardiogenic shock: Steadily worsening dyspnea over the last month, no clear trigger. Markedly worse 3/28, came to ER. EF 10% on bedside echo. Noted to be in atrial fibrillation and hypotensive, started initially on phenylephrine but transitioned over to dobutamine. No chest pain, slight troponin elevation is likely explained by cardiogenic shock/volume overload. Ddx for cardiomyopathy = coronary disease (?chronic) versus myocarditis versus tachy-mediated with atrial fibrillation of uncertain duration. Required Impella placement with low cardiac output on 3/29 (CI 1.3 by thermodilution on dobutamine).  He decompensated and was intubated. Remains on norepi, milrinone and vasopressin.  - Continue milrinone 0.125 mcg/kg/min (decreased with hypotension) and levophed. Coox 76% this am.  BP currently stable.  Will try to get vasopressin off now that sepsis resolving.  - Filling pressures elevated, continue UF - pull as BP tolerates. Now pulling 75/hr Renal on Board. Place TED hose  - Continue Impella support, now down to P5 with hemolysis.  Can increase milrinone if output falls.  Good waveform, no alarms. Impella position adjusted on echo (pulled back 0.5 cm). - If he recovers, will eventually need coronary angiography.  2. Acute hypoxic respiratory failure:  Remains intubated. Settings per CCM 3. AKI: Suspect ATN, cardiorenal/related to low output HF and hypotension. Currently on CVVH. 4. Atrial fibrillation: On heparin and amiodarone gtt for rate control.  Uncertain  duration of atrial fibrillation, cannot rule out tachy-mediated CMP.  5. Endo: Elevated TSH and mildly elevated free T4. ?Sick euthyroid. Will follow.  6. Elevated LFTs: Suspect shock liver.  LFTs rose prior to amiodarone initiation.  AST/ALT improving but tbili up consistent with hemolysis. Suspect shock liver component as well  7. Hemolysis: Bilirubin not checked today. Will recheck in am  Suspect due to Impella.  Hemoglobin stabe. PLTs coming down slwoly. Impella decreased to P-5. Position check personally with echo and readjusted. 8. ID probable sepsis: Fever 3/30, blood cultures sen. On empiric vanco/Zosyn. Cultures NGTD today.   The patient is critically ill with multiple organ systems failure and requires high complexity decision making for assessment and support, frequent evaluation and  titration of therapies, application of advanced monitoring technologies and extensive interpretation of multiple databases.   Critical Care Time devoted to patient care services described in this note is 45 Minutes.   Length of Stay: 4  Tyler, Wesley 04/29/2015, 3:35 PM  Advanced Heart Failure Team Pager [phone] (M-F; 7a - 4p)  Please contact [facility] for night-coverage after hours (4p -7a ) and weekends on [email or web address]

## 2015-04-29 NOTE — Progress Notes (Signed)
Subjective: Interval History: has no complaint, on vent, sedated PCW 23, PAD 24-27, CVP 12.  Objective: Vital signs in last 24 hours: Temp:  [97.3 F (36.3 C)-98.1 F (36.7 C)] 97.9 F (36.6 C) (04/02 0700) Pulse Rate:  [92-105] 92 (04/02 0355) Resp:  [13-22] 18 (04/02 0700) BP: (93-137)/(57-125) 94/57 mmHg (04/01 1630) SpO2:  [91 %-100 %] 99 % (04/02 0700) Arterial Line BP: (94-115)/(61-71) 111/69 mmHg (04/02 0700) FiO2 (%):  [40 %-50 %] 40 % (04/02 0600) Weight:  [95.5 kg (210 lb 8.6 oz)] 95.5 kg (210 lb 8.6 oz) (04/02 0500) Weight change: -2.4 kg (-5 lb 4.7 oz)  Intake/Output from previous day: 04/01 0701 - 04/02 0700 In: 3917.6 [I.V.:2309.4; NG/GT:940; IV Piggyback:400] Out: 5189 [Urine:2] Intake/Output this shift:    General appearance: pale and sedated Resp: rales bibasilar and rhonchi bibasilar Cardio: irregularly irregular rhythm and systolic murmur: holosystolic 2/6, blowing at apex, impella GI: soft, non-tender; bowel sounds normal; no masses,  no organomegaly Extremities: edema 3+  Lab Results:  Recent Labs  04/28/15 0332 04/29/15 0411  WBC 21.0* 16.9*  HGB 11.9* 11.8*  HCT 34.7* 35.3*  PLT 152 120*   BMET:  Recent Labs  04/28/15 1607 04/29/15 0411  NA 135 136  K 4.3 4.1  CL 101 104  CO2 20* 23  GLUCOSE 161* 183*  BUN 20 18  CREATININE 2.39* 2.08*  CALCIUM 7.6* 7.8*    Recent Labs  04/26/15 1405  PTH 411*   Iron Studies: No results for input(s): IRON, TIBC, TRANSFERRIN, FERRITIN in the last 72 hours.  Studies/Results: Dg Abd 1 View  04/27/2015  CLINICAL DATA:  Small bowel obstruction. EXAM: ABDOMEN - 1 VIEW COMPARISON:  04/27/2015 at 7:26 a.m. FINDINGS: Right femoral catheter unchanged. Nasogastric tube unchanged. Small caliber rectal catheter present. Air is present throughout the colon. Persistent air-filled bowel loop in the mid abdomen without significant change which may represent redundant sigmoid colon although cannot exclude small  bowel. No free peritoneal air. Remainder of the exam is unchanged. IMPRESSION: Stable air-filled bowel loop in the mid abdomen likely redundant sigmoid colon, although cannot exclude dilated small bowel. Recommend serial follow-up abdominal films. Tubes and lines as described. Electronically Signed   By: Elberta Fortis M.D.   On: 04/27/2015 19:17   Dg Chest Port 1 View  04/29/2015  CLINICAL DATA:  Congestive heart failure. EXAM: PORTABLE CHEST 1 VIEW COMPARISON:  04/28/2015 FINDINGS: An endotracheal tube with tip 8 cm above the carina, right IJ Swan-Ganz catheter with tip overlying the right lower lobe pulmonary artery, NG tube with tip overlying the mid stomach again noted. Cardiomegaly and pulmonary vascular congestion with bilateral lower lung atelectasis/consolidation again noted. There is no evidence of pneumothorax. Probable pulmonary effusions again noted. Mildly distended gas-filled colon is identified. IMPRESSION: Unchanged appearance of the chest with point vascular congestion and bilateral lower lung atelectasis/ consolidation with small effusions. Mildly distended gas-filled colon now identified. Electronically Signed   By: Harmon Pier M.D.   On: 04/29/2015 07:16   Dg Chest Port 1 View  04/28/2015  CLINICAL DATA:  Congestive heart failure. EXAM: PORTABLE CHEST 1 VIEW COMPARISON:  April 27, 2015. FINDINGS: Stable cardiomediastinal silhouette. Nasogastric and endotracheal tubes are unchanged in position. Right internal jugular Swan-Ganz catheter is again noted and unchanged with distal tip in lower lobe branch of right pulmonary artery. No pneumothorax is noted. Stable bilateral basilar opacities are noted concerning for edema or atelectasis with associated pleural effusions. Bony thorax is unremarkable. IMPRESSION: Stable  support apparatus. Stable bilateral lung opacities as described above. Electronically Signed   By: Lupita RaiderJames  Green Jr, M.D.   On: 04/28/2015 07:31    I have reviewed the patient's  current medications.  Assessment/Plan: 1 AKI vol xs, bp stable to better. Less pressors. Will try for more neg. K/acid/base ok.  Good solute clearance 2 CM per Cards 3 VDRF O2 ok. Per CCM 4 schock liver check in am 5 nutrition TF P CRRT, ^ net neg, pressors wean if poss, vent. TF    LOS: 4 days   Kline Bulthuis L 04/29/2015,7:47 AM

## 2015-04-29 NOTE — Progress Notes (Signed)
RN called RT to inform of patient having a cuff leak. RT came to the room and patients tube had come out by 2cm. RT advanced tube back to 23 @ the lip and changed the tube holder. RT will continue to monitor.

## 2015-04-29 NOTE — Progress Notes (Signed)
PULMONARY / CRITICAL CARE MEDICINE   Name: Wesley Tyler MRN: [id number] DOB: 06-29-1950    ADMISSION DATE:  03/29/2015 CONSULTATION DATE:  2015/05/16  REFERRING MD:  EDP  CHIEF COMPLAINT:  SOB  HISTORY OF PRESENT ILLNESS:  Wesley Tyler is a 65 y.o. male with no known PMH.  He had not seen a health care provider since his 10's and on 04/02/2015, his cousin made him go to an urgent care center to establish care and for further evaluation of occasional palpitations that pt had been experiencing on and off.  Palpitations had apparently worsened over the past 6 months and for the past 2 weeks, his heart had been continually beating "fast and hard".  He had not had any exacerbating or alleviating factors.  He has had associated severe SOB that is worse with exertion, increase in LE edema, PND and orthopnea.  Per cousin, pt sleeps in a chair sitting upright.   He has also had mild intermittent cough that was non productive.  Denies any fevers/chills/sweats, headaches, lightheadedness, chest pain, N/V/D, abd pain, myalgias.  At urgent care he was found to be in A.fib with RVR and was treated with lopressor and eliquis.  Lab work showed elevated BNP and troponin so pt was instructed to come to ED for further evaluation.  In ED, he had AKI with SCr 1.79 > 2.21 (unknown baseline), lactic acidosis (lactate 4.3), transaminitis, TSH 6.  PCCM was called for admission.   SUBJECTIVE:  No events overnight.   VITAL SIGNS: BP 94/57 mmHg  Pulse 113  Temp(Src) 97.9 F (36.6 C) (Core (Comment))  Resp 18  Ht  (1.93 m)  Wt 95.5 kg (210 lb 8.6 oz)  BMI 25.64 kg/m2  SpO2 98%  HEMODYNAMICS: PAP: (39-70)/(21-37) 70/37 mmHg CVP:  [11 mmHg-23 mmHg] 23 mmHg PCWP:  [17 mmHg-23 mmHg] 22 mmHg CO:  [3.4 L/min-4 L/min] 4 L/min CI:  [1.5 L/min/m2-1.7 L/min/m2] 1.7 L/min/m2  VENTILATOR SETTINGS: Vent Mode:  [-] PRVC FiO2 (%):  [40 %-50 %] 40 % Set Rate:  [18 bmp] 18 bmp Vt Set:  [620 mL] 620 mL PEEP:  [5  cmH20-8 cmH20] 5 cmH20 Plateau Pressure:  [18 cmH20-26 cmH20] 18 cmH20  INTAKE / OUTPUT: I/O last 3 completed shifts: In: 5463.4 [I.V.:3614.8; Other:408.6; NG/GT:940; IV Piggyback:500] Out: 7177 [Urine:7; Other:7170]  PHYSICAL EXAMINATION: General: Caucasian male, in NAD. Sedated. Neuro: A&O x 3, CN grossly intact HEENT: Marie/AT. PERRL, sclerae anicteric. Cardiovascular: IRIR, no M/R/G.  Lungs: Respirations even and unlabored. Bibasilar crackles > more crackles  Abdomen: BS x 4, soft, NT/ND.  Musculoskeletal: No gross deformities, 3+ edema to knees. Skin: Intact, cool, no rashes.  LABS:  BMET  Recent Labs Lab 04/28/15 0332 04/28/15 1607 04/29/15 0411  NA 133* 135 136  K 4.6 4.3 4.1  CL 102 101 104  CO2 21* 20* 23  BUN 27* 20 18  CREATININE 2.48* 2.39* 2.08*  GLUCOSE 133* 161* 183*    Electrolytes  Recent Labs Lab 04/27/15 1530 04/28/15 0332 04/28/15 1607 04/29/15 0411  CALCIUM 7.5* 7.8* 7.6* 7.8*  MG 2.4 2.4  --  2.5*  PHOS 3.9 3.3 3.3 3.0    CBC  Recent Labs Lab 04/27/15 1800 04/28/15 0332 04/29/15 0411  WBC 21.8* 21.0* 16.9*  HGB 12.1* 11.9* 11.8*  HCT 35.3* 34.7* 35.3*  PLT 159 152 120*    Coag's  Recent Labs Lab 04/27/15 0430 04/28/15 0332 04/29/15 0411  APTT >200* 141* 69*    Sepsis Markers  Recent  Labs Lab 04/20/2015 0357 04/14/2015 0550 04/26/15 1433  LATICACIDVEN 4.53* 2.7* 3.0*  PROCALCITON  --  0.16  --     ABG  Recent Labs Lab 04/28/15 1120 04/29/15 0406 04/29/15 0534  PHART 7.392 7.358 7.422  PCO2ART 37.9 41.5 38.9  PO2ART 165.0* 158* 149.0*    Liver Enzymes  Recent Labs Lab 04/26/15 0400  04/27/15 0430 04/27/15 1530 04/28/15 0332 04/28/15 1607  AST 1472*  --  942*  --  610*  --   ALT 601*  --  451*  --  364*  --   ALKPHOS 114  --  103  --  103  --   BILITOT 5.4*  --  10.8*  --  10.2*  --   ALBUMIN 2.7*  < > 2.3* 2.1* 2.1* 2.0*  < > = values in this interval not displayed.  Cardiac Enzymes  Recent  Labs Lab Apr 23, 2015 1630 03/29/2015 0550  TROPONINI 0.06* 0.10*    Glucose  Recent Labs Lab 04/28/15 1113 04/28/15 1540 04/28/15 1949 04/28/15 2356 04/29/15 0406 04/29/15 0747  GLUCAP 139* 153* 134* 144* 174* 151*    Imaging Dg Chest Port 1 View  04/29/2015  CLINICAL DATA:  Congestive heart failure. EXAM: PORTABLE CHEST 1 VIEW COMPARISON:  04/28/2015 FINDINGS: An endotracheal tube with tip 8 cm above the carina, right IJ Swan-Ganz catheter with tip overlying the right lower lobe pulmonary artery, NG tube with tip overlying the mid stomach again noted. Cardiomegaly and pulmonary vascular congestion with bilateral lower lung atelectasis/consolidation again noted. There is no evidence of pneumothorax. Probable pulmonary effusions again noted. Mildly distended gas-filled colon is identified. IMPRESSION: Unchanged appearance of the chest with point vascular congestion and bilateral lower lung atelectasis/ consolidation with small effusions. Mildly distended gas-filled colon now identified. Electronically Signed   By: Harmon PierJeffrey  Hu M.D.   On: 04/29/2015 07:16     STUDIES:  CXR 03/28 > pulm edema TEE (3/29) EF 5-10%, diffuse hypokinesis, mod AI and mod MR, severe TR   CULTURES: Blood 03/29 >NTD Urine 03/29 >NTD Sputum 03/29 >NTD  ANTIBIOTICS: Zosyn 3/30 > Vanc 3/30 >   SIGNIFICANT EVENTS: 03/29 > admitted with A.fib, AKI, AGMA, transaminitis, hypothyroidism. Pt intubated for resp distress.EF 5-10% 3/30 > cvvh started  LINES/TUBES: ETT 3/29>>> R IJ Swan 3/29>>>  DISCUSSION: 65 y.o. M with unknown PMH, [facility] after he was found to have A.fib with RVR at urgent care earlier in the day.  In ED, he was found to have AKI, AGMA, transaminitis, and hypothyroidism.  PCCM was asked to admit.  ASSESSMENT / PLAN:  PULMONARY A: Acute hypoxic respiratory failure 2/2 acute CHF exacerbation/pulm edema; R/O HCAP Former smoker. P:   Cont vent support. Not ready for wean  given hemodynamics.  Decrease PEEP to 5 and FiO2 to 40%. See cards section See ID section. Adjust vent accordingly.  CARDIOVASCULAR A:  Cardiogenic shock 2/2 Acute CHF exacerbation, EF 5-10%, CAD (diffuse hypokinesis) S/P Impella (3/29) A.fib with RVR   P:  CHF team following. Appreciate input. Cont milrinone, levophed drip, vasopressin. Epi drip was weaned off Cont CVVH Heparin gtt, amiodarone drip > heparin drip held today 2/2 bleeding per groin D/C propofol to assist with hemodynamics. Levophed down to 28 mcg, continued vaso.  RENAL A:   AKI - unknown baseline. Worsening renal fxn. AKI 2/2 ATN, cardio renal syndrome AGMA - lactate.  P:   CVVH Appreciate renal input.  Replace electrolytes as indicated. BMET in AM.  GASTROINTESTINAL  A:   Transaminitis  2/2 Hepatic congestion fromCHF Ileus vs SBO P:   LFT's better TF per nutrition  HEMATOLOGIC A:   Hemoconcentration. VTE Prophylaxis. P:  SCD's / Heparin gtt > heparin drip held 2/2 to groin bleeding. CBC in AM.  INFECTIOUS A:   Leukocytosis with fever, dec SVR P:   Follow cultures as above. Vanc/zosyn.  ENDOCRINE A:   Hyperglycemia - no hx DM. Hypothyroidism.  P:   SSI. Synthroid - cont. Assess Hgb A1c, TSH.  NEUROLOGIC A:   No acute issues. P:   D/C propofol. Fentanyl drip and Versed PRN.   Family updated: Cousin Wesley Tyler) updated at bedside. Only family around. Pt is LCB, no cardioversion.   Interdisciplinary Family Meeting v Palliative Care Meeting:  Due by: 05/01/15.  The patient is critically ill with multiple organ systems failure and requires high complexity decision making for assessment and support, frequent evaluation and titration of therapies, application of advanced monitoring technologies and extensive interpretation of multiple databases.   Critical Care Time devoted to patient care services described in this note is  35  Minutes. This time reflects time of care of this  signee Dr [name]. This critical care time does not reflect procedure time, or teaching time or supervisory time of PA/NP/Med student/Med Resident etc but could involve care discussion time.  Alyson Reedy, M.D. [facility] Pulmonary/Critical Care Medicine. Pager: [phone]. After hours pager: [phone].

## 2015-04-29 NOTE — Progress Notes (Signed)
Pharmacy Antibiotic Note  Wesley Tyler is a 65 y.o. male admitted on 04/17/2015 with cardiogenic shock requiring an impella. Pharmacy has been consulted for vancomycin and zosyn dosing. Pt is on CRRT so will dose abx and follow accordingly  Plan: 1) Vancomycin 1000mg  IV q24h 2) Zosyn 3.375g IV q6 3) Follow up renal plan  Height: 6\' 4"  (193 cm) Weight: 210 lb 8.6 oz (95.5 kg) IBW/kg (Calculated) : 86.8  Temp (24hrs), Avg:97.8 F (36.6 C), Min:97.3 F (36.3 C), Max:98.1 F (36.7 C)   Recent Labs Lab 04/13/2015 0146 03/28/2015 0357 04/07/2015 0550  04/26/15 1405 04/26/15 1433  04/27/15 0430 04/27/15 1530 04/27/15 1800 04/28/15 0332 04/28/15 1607 04/29/15 0411  WBC  --   --   --   < > 24.3*  --   --  21.8*  --  21.8* 21.0*  --  16.9*  CREATININE  --   --   --   < >  --   --   < > 3.57* 2.81*  --  2.48* 2.39* 2.08*  LATICACIDVEN 4.32* 4.53* 2.7*  --   --  3.0*  --   --   --   --   --   --   --   < > = values in this interval not displayed.  Estimated Creatinine Clearance: 43.5 mL/min (by C-G formula based on Cr of 2.08).    No Known Allergies  Antimicrobials this admission: 3/30 Vancomycin >> 3/30 Zosyn >>  Dose adjustments this admission: n/a  Microbiology results: 3/30 urine >> ngtd 3/30 blood x2 >> ngtd  Thank you for allowing pharmacy to be a part of this patient's care.  Sandi CarneNick Dariel Pellecchia, PharmD Pharmacy Resident Pager: [phone] 04/29/2015 8:43 AM

## 2015-04-29 NOTE — Progress Notes (Signed)
CRRT TMP numbers on day shift (0700-1900) from 04/28/15-04/29/15 were documented in Pressure Drop column and pressure drop numbers documented in TMP column.  [place], Mitzi HansenJessica Marie

## 2015-04-30 ENCOUNTER — Inpatient Hospital Stay (HOSPITAL_COMMUNITY): Payer: Medicare Other

## 2015-04-30 DIAGNOSIS — I482 Chronic atrial fibrillation: Secondary | ICD-10-CM

## 2015-04-30 LAB — COMPREHENSIVE METABOLIC PANEL
ALK PHOS: 121 U/L (ref 38–126)
ALT: 270 U/L — AB (ref 17–63)
ANION GAP: 11 (ref 5–15)
AST: 183 U/L — ABNORMAL HIGH (ref 15–41)
Albumin: 2 g/dL — ABNORMAL LOW (ref 3.5–5.0)
BILIRUBIN TOTAL: 8.2 mg/dL — AB (ref 0.3–1.2)
BUN: 19 mg/dL (ref 6–20)
CALCIUM: 7.7 mg/dL — AB (ref 8.9–10.3)
CO2: 23 mmol/L (ref 22–32)
Chloride: 102 mmol/L (ref 101–111)
Creatinine, Ser: 1.99 mg/dL — ABNORMAL HIGH (ref 0.61–1.24)
GFR, EST AFRICAN AMERICAN: 39 mL/min — AB (ref >60)
GFR, EST NON AFRICAN AMERICAN: 33 mL/min — AB (ref >60)
Glucose, Bld: 186 mg/dL — ABNORMAL HIGH (ref 65–99)
Potassium: 3.9 mmol/L (ref 3.5–5.1)
SODIUM: 136 mmol/L (ref 135–145)
TOTAL PROTEIN: 5.7 g/dL — AB (ref 6.5–8.1)

## 2015-04-30 LAB — RENAL FUNCTION PANEL
ALBUMIN: 2 g/dL — AB (ref 3.5–5.0)
ANION GAP: 12 (ref 5–15)
Albumin: 2 g/dL — ABNORMAL LOW (ref 3.5–5.0)
Anion gap: 10 (ref 5–15)
BUN: 19 mg/dL (ref 6–20)
BUN: 22 mg/dL — ABNORMAL HIGH (ref 6–20)
CALCIUM: 7.7 mg/dL — AB (ref 8.9–10.3)
CHLORIDE: 101 mmol/L (ref 101–111)
CO2: 22 mmol/L (ref 22–32)
CO2: 23 mmol/L (ref 22–32)
CREATININE: 1.98 mg/dL — AB (ref 0.61–1.24)
Calcium: 7.7 mg/dL — ABNORMAL LOW (ref 8.9–10.3)
Chloride: 104 mmol/L (ref 101–111)
Creatinine, Ser: 2.03 mg/dL — ABNORMAL HIGH (ref 0.61–1.24)
GFR calc Af Amer: 39 mL/min — ABNORMAL LOW (ref >60)
GFR calc Af Amer: 38 mL/min — ABNORMAL LOW (ref >60)
GFR calc non Af Amer: 33 mL/min — ABNORMAL LOW (ref >60)
GFR, EST NON AFRICAN AMERICAN: 34 mL/min — AB (ref >60)
GLUCOSE: 182 mg/dL — AB (ref 65–99)
GLUCOSE: 147 mg/dL — AB (ref 65–99)
PHOSPHORUS: 2 mg/dL — AB (ref 2.5–4.6)
PHOSPHORUS: 3 mg/dL (ref 2.5–4.6)
POTASSIUM: 4.1 mmol/L (ref 3.5–5.1)
Potassium: 3.9 mmol/L (ref 3.5–5.1)
Sodium: 136 mmol/L (ref 135–145)
Sodium: 136 mmol/L (ref 135–145)

## 2015-04-30 LAB — BLOOD GAS, ARTERIAL
ACID-BASE DEFICIT: 0.4 mmol/L (ref 0.0–2.0)
Bicarbonate: 23.3 mEq/L (ref 20.0–24.0)
DRAWN BY: 365271
FIO2: 0.4
LHR: 18 {breaths}/min
MECHVT: 620 mL
O2 SAT: 98.5 %
PEEP/CPAP: 5 cmH2O
PH ART: 7.433 (ref 7.350–7.450)
Patient temperature: 98.6
TCO2: 24.4 mmol/L (ref 0–100)
pCO2 arterial: 35.5 mmHg (ref 35.0–45.0)
pO2, Arterial: 115 mmHg — ABNORMAL HIGH (ref 80.0–100.0)

## 2015-04-30 LAB — CARBOXYHEMOGLOBIN
CARBOXYHEMOGLOBIN: 1.4 % (ref 0.5–1.5)
Carboxyhemoglobin: 1.2 % (ref 0.5–1.5)
Carboxyhemoglobin: 1.3 % (ref 0.5–1.5)
METHEMOGLOBIN: 1.4 % (ref 0.0–1.5)
Methemoglobin: 1.6 % — ABNORMAL HIGH (ref 0.0–1.5)
Methemoglobin: 1.3 % (ref 0.0–1.5)
O2 SAT: 60.7 %
O2 Saturation: 54.9 %
O2 Saturation: 54.8 %
TOTAL HEMOGLOBIN: 10.3 g/dL — AB (ref 13.5–18.0)
Total hemoglobin: 11.1 g/dL — ABNORMAL LOW (ref 13.5–18.0)
Total hemoglobin: 12.7 g/dL — ABNORMAL LOW (ref 13.5–18.0)

## 2015-04-30 LAB — GLUCOSE, CAPILLARY
GLUCOSE-CAPILLARY: 127 mg/dL — AB (ref 65–99)
GLUCOSE-CAPILLARY: 131 mg/dL — AB (ref 65–99)
GLUCOSE-CAPILLARY: 143 mg/dL — AB (ref 65–99)
Glucose-Capillary: 166 mg/dL — ABNORMAL HIGH (ref 65–99)
Glucose-Capillary: 150 mg/dL — ABNORMAL HIGH (ref 65–99)

## 2015-04-30 LAB — MAGNESIUM: MAGNESIUM: 2.5 mg/dL — AB (ref 1.7–2.4)

## 2015-04-30 LAB — CBC
HCT: 32.8 % — ABNORMAL LOW (ref 39.0–52.0)
Hemoglobin: 10.9 g/dL — ABNORMAL LOW (ref 13.0–17.0)
MCH: 29.9 pg (ref 26.0–34.0)
MCHC: 33.2 g/dL (ref 30.0–36.0)
MCV: 89.9 fL (ref 78.0–100.0)
PLATELETS: 121 10*3/uL — AB (ref 150–400)
RBC: 3.65 MIL/uL — ABNORMAL LOW (ref 4.22–5.81)
RDW: 14.2 % (ref 11.5–15.5)
WBC: 13.5 10*3/uL — AB (ref 4.0–10.5)

## 2015-04-30 LAB — POCT ACTIVATED CLOTTING TIME
ACTIVATED CLOTTING TIME: 178 s
ACTIVATED CLOTTING TIME: 172 s
ACTIVATED CLOTTING TIME: 167 s
ACTIVATED CLOTTING TIME: 167 s
ACTIVATED CLOTTING TIME: 173 s

## 2015-04-30 LAB — APTT: aPTT: 63 seconds — ABNORMAL HIGH (ref 24–37)

## 2015-04-30 LAB — PHOSPHORUS: Phosphorus: 2 mg/dL — ABNORMAL LOW (ref 2.5–4.6)

## 2015-04-30 LAB — LACTATE DEHYDROGENASE: LDH: 1403 U/L — ABNORMAL HIGH (ref 98–192)

## 2015-04-30 MED ORDER — NAFCILLIN SODIUM 2 G IJ SOLR
2.0000 g | Freq: Four times a day (QID) | INTRAVENOUS | Status: DC
  Administered 2015-04-30 – 2015-05-06 (×25): 2 g via INTRAVENOUS
  Filled 2015-04-30 (×27): qty 2000

## 2015-04-30 MED ORDER — PANTOPRAZOLE SODIUM 40 MG PO PACK
40.0000 mg | PACK | ORAL | Status: DC
  Administered 2015-04-30 – 2015-05-03 (×4): 40 mg
  Filled 2015-04-30 (×4): qty 20

## 2015-04-30 MED ORDER — POTASSIUM & SODIUM PHOSPHATES 280-160-250 MG PO PACK
1.0000 | PACK | Freq: Two times a day (BID) | ORAL | Status: DC
  Administered 2015-04-30 – 2015-05-01 (×4): 1 via ORAL
  Filled 2015-04-30 (×6): qty 1

## 2015-04-30 NOTE — Progress Notes (Signed)
ANTICOAGULATION CONSULT NOTE - Follow Up Consult  Pharmacy Consult for heparin  Indication: s/p impella pump/afib  No Known Allergies  Patient Measurements: Height: 6' 4"  (193 cm) Weight: 206 lb 12.7 oz (93.8 kg) IBW/kg (Calculated) : 86.8   Vital Signs: Temp: 98.2 F (36.8 C) (04/03 0700) Temp Source: Core (Comment) (04/03 0600) Pulse Rate: 96 (04/03 0334)  Labs:  Recent Labs  04/28/15 0332  04/29/15 0411 04/29/15 1556 04/30/15 0400  HGB 11.9*  --  11.8*  --  10.9*  HCT 34.7*  --  35.3*  --  32.8*  PLT 152  --  120*  --  121*  APTT 141*  --  69*  --  63*  CREATININE 2.48*  < > 2.08* 2.03* 1.98*  < > = values in this interval not displayed.  Estimated Creatinine Clearance: 45.7 mL/min (by C-G formula based on Cr of 1.98).   Medications:  Scheduled:  . antiseptic oral rinse  7 mL Mouth Rinse 10 times per day  . chlorhexidine gluconate (SAGE KIT)  15 mL Mouth Rinse BID  . feeding supplement (PRO-STAT SUGAR FREE 64)  30 mL Per Tube TID  . insulin aspart  0-15 Units Subcutaneous 6 times per day  . pantoprazole (PROTONIX) IV  40 mg Intravenous Q24H  . piperacillin-tazobactam (ZOSYN)  IV  3.375 g Intravenous 4 times per day  . sodium chloride flush  10-40 mL Intracatheter Q12H  . sodium chloride flush  3 mL Intravenous Q12H  . sodium chloride flush  3 mL Intravenous Q12H  . vancomycin  1,000 mg Intravenous Q24H    Assessment: 65 yo male with cardiogenic shock s/p RHC. Cardiac index= 1.3 and he is now on an impella pump   Systemic heparin gtt restarted to maintain ACT 160-180sec ACT have been at goal Heparin drip 900 uts/hr aPTT 63 H/H stable PLTC slight drop 150-120s No bleeding noted Watch for hemolysis with impella  Goal of Therapy:  ACT= 160-180 Monitor platelets by anticoagulation protocol: Yes   Plan:  Coninue heparin  900 units/hr -Watch levels closely after initiation of CRRT -Will follow ACTs and purge rates    Bonnita Nasuti Pharm.D. CPP,  BCPS Clinical Pharmacist [phone] 04/30/2015 7:31 AM

## 2015-04-30 NOTE — Progress Notes (Signed)
CVVHD Management.  Metabolically stable except PO4 sl low and will replace.  Still on pressors.  Will reduce post filter fluid and change dialysate to 1500cc.

## 2015-04-30 NOTE — Progress Notes (Signed)
Pharmacy Antibiotic Note  Wesley DuffelJoseph Tyler is a 65 y.o. male admitted on 04/26/2015 with cardiogenic shock requiring an impella. Pharmacy has been consulted for nafcillin dosing. Pt continues on CRRT which should not effect dosing as nafcillin is not renally cleared. LFTs are elevated but down significantly from even two days ago.  Plan: 1) d/c vanc/zosyn 2) Nafcillin 2g q6 hours 3) Follow LFTs periodically  Height: 6\' 4"  (193 cm) Weight: 206 lb 12.7 oz (93.8 kg) IBW/kg (Calculated) : 86.8  Temp (24hrs), Avg:98.3 F (36.8 C), Min:97.5 F (36.4 C), Max:99 F (37.2 C)   Recent Labs Lab 27-Jun-2015 0146 27-Jun-2015 0357 27-Jun-2015 0550  04/26/15 1433  04/27/15 0430  04/27/15 1800 04/28/15 0332 04/28/15 1607 04/29/15 0411 04/29/15 1556 04/30/15 0400  WBC  --   --   --   < >  --   --  21.8*  --  21.8* 21.0*  --  16.9*  --  13.5*  CREATININE  --   --   --   < >  --   < > 3.57*  < >  --  2.48* 2.39* 2.08* 2.03* 1.99*  1.98*  LATICACIDVEN 4.32* 4.53* 2.7*  --  3.0*  --   --   --   --   --   --   --   --   --   < > = values in this interval not displayed.  Estimated Creatinine Clearance: 45.7 mL/min (by C-G formula based on Cr of 1.98).    No Known Allergies  Antimicrobials this admission: 3/30 Vancomycin >>4/3 3/30 Zosyn >>4/3 4/3 Nafcillin >>  Dose adjustments this admission: n/a  Microbiology results: 3/30 urine >> ngF 3/30 blood x2 >> ngtd 3/30 Resp cx >>Group C strep  Thank you for allowing pharmacy to be a part of this patient's care.  Sheppard CoilFrank Wilson PharmD., BCPS Clinical Pharmacist Pager [phone] 04/30/2015 9:29 AM

## 2015-04-30 NOTE — Progress Notes (Signed)
150mL of Fentanyl gtt wasted in sink due to gtt expired, witnessed x2 RN Olevia Perches(H. Jashayla Glatfelter, RN and Paul HalfN. Beasley, RN).

## 2015-04-30 NOTE — Progress Notes (Signed)
Dr.Alva called concerning SGC waveform, wave is dampened, appearance of wedge in which no numbers have been shot for C.O therefore no wedging has even been attempted. Pressure lines were changed out earlier due to protocal but SGC was at 65 cm before and after. [facility]

## 2015-04-30 NOTE — Progress Notes (Signed)
Had Dr.Alva to view PCXR, MD had NP Joneen RoachPaul Hoffman to repositioned [facility], NP witnessed that balloon was deflated with dampened waveform/inaccurate numbers. NP repositioned SGC with MD observing via E-Link, SGC now sits around 55cm with waveform coming and going. Dr.Alva states no need for F/U CXR, Made MD aware that I was going to call cardiology to let them know of situation.

## 2015-04-30 NOTE — Progress Notes (Signed)
[facility] Dr Shirlee LatchMcLean at bedside and pulled SGC back ~2cm. Waveform continues to be dampened but looks more accurate than prior to reposition. Per MD ok to continue to use.

## 2015-04-30 NOTE — Progress Notes (Signed)
ANTICOAGULATION CONSULT NOTE - Follow Up Consult  Pharmacy Consult for heparin  Indication: s/p impella pump/afib  No Known Allergies  Patient Measurements: Height: 6' 4" (193 cm) Weight: 206 lb 12.7 oz (93.8 kg) IBW/kg (Calculated) : 86.8   Vital Signs: Temp: 98.6 F (37 C) (04/03 1941) Temp Source: Core (Comment) (04/03 1600) BP: 118/63 mmHg (04/03 1632) Pulse Rate: 108 (04/03 1632)  Labs:  Recent Labs  04/28/15 0332  04/29/15 0411 04/29/15 1556 04/30/15 0400 04/30/15 1714  HGB 11.9*  --  11.8*  --  10.9*  --   HCT 34.7*  --  35.3*  --  32.8*  --   PLT 152  --  120*  --  121*  --   APTT 141*  --  69*  --  63*  --   CREATININE 2.48*  < > 2.08* 2.03* 1.99*  1.98* 2.03*  < > = values in this interval not displayed.  Estimated Creatinine Clearance: 44.5 mL/min (by C-G formula based on Cr of 2.03).   Medications:  Scheduled:  . antiseptic oral rinse  7 mL Mouth Rinse 10 times per day  . chlorhexidine gluconate (SAGE KIT)  15 mL Mouth Rinse BID  . feeding supplement (PRO-STAT SUGAR FREE 64)  30 mL Per Tube TID  . insulin aspart  0-15 Units Subcutaneous 6 times per day  . nafcillin IV  2 g Intravenous 4 times per day  . pantoprazole sodium  40 mg Per Tube Q24H  . potassium & sodium phosphates  1 packet Oral BID  . sodium chloride flush  10-40 mL Intracatheter Q12H  . sodium chloride flush  3 mL Intravenous Q12H  . sodium chloride flush  3 mL Intravenous Q12H    Assessment: 64 yo male with cardiogenic shock s/p RHC. Cardiac index= 1.3 and he is now on an impella pump   Systemic heparin gtt restarted to maintain ACT 160-180sec ACT have been at goal; most recent level is 167 Heparin drip 900 uts/hr aPTT 63 H/H stable PLTC slight drop 150-120s No bleeding noted Watch for hemolysis with impella  Goal of Therapy:  ACT= 160-180 Monitor platelets by anticoagulation protocol: Yes   Plan:  1. Coninue heparin at 900 units/hr 2. Watch levels closely after  initiation of CRRT 3. Will follow ACTs and purge rates   Andy Johnston, PharmD, BCPS 04/30/2015, 7:52 PM Pager: [phone]     

## 2015-04-30 NOTE — Progress Notes (Signed)
Advanced Heart Failure Rounding Note  PCP: Recently established with Iran Planas, PA-C - Family medicine (04/08/2015) Primary Cardiologist: New  Subjective:    Admitted 3/28 with cardiogenic shock.  He became critically worse with C.I. of 0.84 despite Impella and Dobutamine 1 on 3/29.  Dobutamine switched to milrinone as he was also getting amiodarone.  De-satted into 50% with profound respiratory fatigue and was intubated.   He developed progressive AKI and was started on CVVH.   Remains intubated and sedated but awakens to commands. Off vasopressin, now on milrinone 0.125 and norepi 24 mcg. No urine output. CVVH pulling 200 /hour. WBC coming down.   Co-ox 61% & 55% (sent twice this am), PLTs leveled off 121k. Patient wakes with sedation wean and follows commands.   TEE (3/29):  EF 5-10% diffuse hypokinesis, dilated RV with severely decreased systolic function, moderate AI, moderate MR, severe TR. Spontaneous left to right bubbles but unable to discover source (not ASD or VSD, ?pulmonary AVMs).   Swan numbers CVP 12-14 PA 43/22 PCWP 21 Thermo CI = 2.1  Impella P-5    Objective:   Weight Range: 206 lb 12.7 oz (93.8 kg) Body mass index is 25.18 kg/(m^2).   Vital Signs:   Temp:  [97.5 F (36.4 C)-99 F (37.2 C)] 98.2 F (36.8 C) (04/03 0700) Pulse Rate:  [96-113] 96 (04/03 0334) Resp:  [16-37] 20 (04/03 0700) BP: (109-111)/(65-66) 111/65 mmHg (04/02 1522) SpO2:  [93 %-100 %] 100 % (04/03 0700) Arterial Line BP: (94-130)/(53-80) 101/65 mmHg (04/03 0700) FiO2 (%):  [40 %] 40 % (04/03 0600) Weight:  [206 lb 12.7 oz (93.8 kg)] 206 lb 12.7 oz (93.8 kg) (04/03 0400) Last BM Date: 04/29/15  Weight change: Filed Weights   04/28/15 0344 04/29/15 0500 04/30/15 0400  Weight: 215 lb 13.3 oz (97.9 kg) 210 lb 8.6 oz (95.5 kg) 206 lb 12.7 oz (93.8 kg)    Intake/Output:   Intake/Output Summary (Last 24 hours) at 04/30/15 0707 Last data filed at 04/30/15 0700  Gross per 24  hour  Intake 4168.06 ml  Output   5689 ml  Net -1520.94 ml     Physical Exam: General:  Intubated, sedated. Eyes closes.  HEENT: normal Neck: supple. Carotids 2+ bilat; no bruits. No thyromegaly or lymphadenopathy.  Cor: PMI displaced laterally. Irregular rate & rhythm. +S3 Lungs: Mechanical breath sounds Abdomen: soft, NT, ND, no HSM. No bruits or masses. +BS  Extremities: warm  LLE. 3+ edema into thighs. Unable to palpate pedal pulses. Left groin ecchymosis stable. Impella sheath ok. LLE immobilizer in place. RLE compression stocking.  Neuro: Intubated, sedated.   Telemetry: Reviewed, Afib 100s   Labs: CBC  Recent Labs  04/29/15 0411 04/30/15 0400  WBC 16.9* 13.5*  HGB 11.8* 10.9*  HCT 35.3* 32.8*  MCV 90.7 89.9  PLT 120* 937*   Basic Metabolic Panel  Recent Labs  04/28/15 0332  04/29/15 0411 04/29/15 1556 04/30/15 0400  NA 133*  < > 136 134* 136  K 4.6  < > 4.1 3.9 3.9  CL 102  < > 104 101 104  CO2 21*  < > 23 21* 22  GLUCOSE 133*  < > 183* 214* 182*  BUN 27*  < > _0 CREATININE 2.48*  < > 2.08* 2.03* 1.98*  CALCIUM 7.8*  < > 7.8* 7.8* 7.7*  MG 2.4  --  2.5*  --   --   PHOS 3.3  < > 3.0 2.4* 2.0*  < > =  values in this interval not displayed. Liver Function Tests  Recent Labs  04/28/15 0332  04/29/15 1556 04/30/15 0400  AST 610*  --   --   --   ALT 364*  --   --   --   ALKPHOS 103  --   --   --   BILITOT 10.2*  --   --   --   PROT 5.1*  --   --   --   ALBUMIN 2.1*  < > 2.1* 2.0*  < > = values in this interval not displayed. No results for input(s): LIPASE, AMYLASE in the last 72 hours. Cardiac Enzymes No results for input(s): CKTOTAL, CKMB, CKMBINDEX, TROPONINI in the last 72 hours.  BNP: BNP (last 3 results)  Recent Labs  04/03/2015 2338  BNP 612.8*    ProBNP (last 3 results) No results for input(s): PROBNP in the last 8760 hours.   D-Dimer No results for input(s): DDIMER in the last 72 hours. Hemoglobin A1C No results for  input(s): HGBA1C in the last 72 hours. Fasting Lipid Panel  Recent Labs  04/28/15 1953  TRIG 133   Thyroid Function Tests No results for input(s): TSH, T4TOTAL, T3FREE, THYROIDAB in the last 72 hours.  Invalid input(s): FREET3  Other results:     Imaging/Studies:  Dg Chest Port 1 View  04/29/2015  CLINICAL DATA:  Congestive heart failure. EXAM: PORTABLE CHEST 1 VIEW COMPARISON:  04/28/2015 FINDINGS: An endotracheal tube with tip 8 cm above the carina, right IJ Swan-Ganz catheter with tip overlying the right lower lobe pulmonary artery, NG tube with tip overlying the mid stomach again noted. Cardiomegaly and pulmonary vascular congestion with bilateral lower lung atelectasis/consolidation again noted. There is no evidence of pneumothorax. Probable pulmonary effusions again noted. Mildly distended gas-filled colon is identified. IMPRESSION: Unchanged appearance of the chest with point vascular congestion and bilateral lower lung atelectasis/ consolidation with small effusions. Mildly distended gas-filled colon now identified. Electronically Signed   By: Margarette Canada M.D.   On: 04/29/2015 07:16    Latest Echo  Latest Cath   Medications:     Scheduled Medications: . antiseptic oral rinse  7 mL Mouth Rinse 10 times per day  . chlorhexidine gluconate (SAGE KIT)  15 mL Mouth Rinse BID  . feeding supplement (PRO-STAT SUGAR FREE 64)  30 mL Per Tube TID  . insulin aspart  0-15 Units Subcutaneous 6 times per day  . pantoprazole (PROTONIX) IV  40 mg Intravenous Q24H  . piperacillin-tazobactam (ZOSYN)  IV  3.375 g Intravenous 4 times per day  . sodium chloride flush  10-40 mL Intracatheter Q12H  . sodium chloride flush  3 mL Intravenous Q12H  . sodium chloride flush  3 mL Intravenous Q12H  . vancomycin  1,000 mg Intravenous Q24H    Infusions: . sodium chloride Stopped (04/05/2015 1041)  . amiodarone 30 mg/hr (04/30/15 0011)  . epinephrine Stopped (04/26/15 2230)  . feeding supplement  (VITAL 1.5 CAL) 1,000 mL (04/30/15 0521)  . fentaNYL infusion INTRAVENOUS 150 mcg/hr (04/29/15 1419)  . impella catheter heparin 50 unit/mL in dextrose 5%    . heparin 900 Units/hr (04/29/15 2106)  . milrinone 0.125 mcg/kg/min (04/29/15 1110)  . norepinephrine (LEVOPHED) Adult infusion 24 mcg/min (04/30/15 0500)  . dialysis replacement fluid (prismasate) 700 mL/hr at 04/30/15 0242  . dialysis replacement fluid (prismasate) 700 mL/hr at 04/30/15 0203  . dialysate (PRISMASATE) 2,000 mL/hr at 04/30/15 0231  . vasopressin (PITRESSIN) infusion - *FOR SHOCK* Stopped (04/29/15  1600)    PRN Medications: sodium chloride, sodium chloride, sodium chloride, acetaminophen, heparin, heparin, midazolam, ondansetron (ZOFRAN) IV, sodium chloride, sodium chloride flush, sodium chloride flush, sodium chloride flush   Assessment/Plan   Voshon Petro is a 65 y.o. male sent to [facility] after PCP labs came back abnormal with mildly elevated troponin. Was found to be in florid HF and transferred to [facility] for further work up. He has no known medical history, as he has not seen an MD in > 40 years.  1. Acute systolic CHF/cardiogenic shock: Steadily worsening dyspnea over the last month, no clear trigger. Markedly worse 3/28, came to ER. EF 10% on bedside echo. Noted to be in atrial fibrillation and hypotensive, started initially on phenylephrine but transitioned over to dobutamine. No chest pain, slight troponin elevation is likely explained by cardiogenic shock/volume overload. Ddx for cardiomyopathy = coronary disease (?chronic) versus myocarditis versus tachy-mediated with atrial fibrillation of uncertain duration. Required Impella placement with low cardiac output on 3/29 (CI 1.3 by thermodilution on dobutamine).  He decompensated and was intubated. Remains on norepinephrine and milrinone, off vasopressin.  - CO-OX 55%. Increase milrinone 0.25 mcg/kg/min.  Wean norepinephrine as able.  Will follow Swan numbers and  co-ox later this morning with increase in milrinone.  - Continue UF via CVVH - pull as BP tolerates. Now pulling 200/hr.   - Continue Impella support, now down to P5.  Good waveform, no alarms. - If he recovers, will eventually need coronary angiography.  2. Acute hypoxic respiratory failure:  Remains intubated. Settings per CCM 3. AKI: Suspect ATN, cardiorenal/related to low output HF and hypotension. Currently on CVVH. 4. Atrial fibrillation: On heparin and amiodarone gtt for rate control.  Uncertain duration of atrial fibrillation, cannot rule out tachy-mediated CMP.  5. Endo: Elevated TSH and mildly elevated free T4. ?Sick euthyroid. Will follow.  6. Elevated LFTs: Suspect shock liver.  LFTs rose prior to amiodarone initiation.  CMET pending. Suspect shock liver component as well  7. Hemolysis: Bilirubin pending. Hemoglobin with slow downtrend. PLTs leveled off 121. Impella at P-5.  8. ID: Probable sepsis, fever 3/30, blood/sputm cultures sent. On empiric vanco/Zosyn. Blood cultures NGTD today. WBC coming down. Group C Strep in sputum cultures.   The patient is critically ill with multiple organ systems failure and requires high complexity decision making for assessment and support, frequent evaluation and titration of therapies, application of advanced monitoring technologies and extensive interpretation of multiple databases.   Length of Stay: [place] NP-C  04/30/2015, 7:07 AM  Advanced Heart Failure Team Pager [phone] (M-F; 7a - 4p)  Please contact [facility] for night-coverage after hours (4p -7a ) and weekends on [email or web address]  Patient seen with NP, agree with the above note.  BP better, suspect resolving sepsis.  WBCs down.  Now off vasopressin, can wean norepinephrine as tolerated.  Impella at P5, good waveform.  Will increase milrinone to 0.25 with borderline co-ox and aim to try to start decreasing Impella support further.  Ongoing CVVH, pulling 200 cc/hr, still volume  overloaded by Swan numbers.  Group C Strep in sputum.   Hemoglobin slow downtrend, platelets stable.    Critical Care Time devoted to patient care services described in this note is 45 Minutes.  Loralie Champagne 04/30/2015 7:37 AM

## 2015-04-30 NOTE — Progress Notes (Signed)
PULMONARY / CRITICAL CARE MEDICINE   Name: Wesley Tyler MRN: [id number] DOB: November 02, 1950    ADMISSION DATE:  04/22/2015 CONSULTATION DATE:  26-Apr-2015  REFERRING MD:  EDP  CHIEF COMPLAINT:  SOB  SUBJECTIVE:  Remains on pressors, CRRT.   VITAL SIGNS: BP 111/65 mmHg  Pulse 96  Temp(Src) 98.2 F (36.8 C) (Core (Comment))  Resp 18  Ht  (1.93 m)  Wt 206 lb 12.7 oz (93.8 kg)  BMI 25.18 kg/m2  SpO2 97%  HEMODYNAMICS: PAP: (42-70)/(21-44) 42/37 mmHg CVP:  [13 mmHg-30 mmHg] 17 mmHg PCWP:  [21 mmHg-22 mmHg] 21 mmHg CO:  [4.6 L/min-4.7 L/min] 4.7 L/min CI:  [2 L/min/m2-2.1 L/min/m2] 2.1 L/min/m2  VENTILATOR SETTINGS: Vent Mode:  [-] PRVC FiO2 (%):  [40 %] 40 % Set Rate:  [18 bmp] 18 bmp Vt Set:  [620 mL] 620 mL PEEP:  [5 cmH20] 5 cmH20 Plateau Pressure:  [16 cmH20-20 cmH20] 19 cmH20  INTAKE / OUTPUT: I/O last 3 completed shifts: In: 6136.5 [I.V.:3086.1; Other:367; NG/GT:2183.3; IV Piggyback:500] Out: 8471 [Other:8471]  PHYSICAL EXAMINATION: General: sedated Neuro: RASS -1, follows commands, moves all extremities HEENT: ETT in place Cardiac: regular, 2/6 murmur Chest: basilar rales, no wheeze Abd: soft, non tender Ext: no edema Skin: no rashes  LABS:  BMET  Recent Labs Lab 04/29/15 0411 04/29/15 1556 04/30/15 0400  NA 136 134* 136  136  K 4.1 3.9 3.9  3.9  CL 104 101 102  104  CO2 23 21* 23  22  BUN CREATININE 2.08* 2.03* 1.99*  1.98*  GLUCOSE 183* 214* 186*  182*    Electrolytes  Recent Labs Lab 04/28/15 0332  04/29/15 0411 04/29/15 1556 04/30/15 0400  CALCIUM 7.8*  < > 7.8* 7.8* 7.7*  7.7*  MG 2.4  --  2.5*  --  2.5*  PHOS 3.3  < > 3.0 2.4* 2.0*  2.0*  < > = values in this interval not displayed.  CBC  Recent Labs Lab 04/28/15 0332 04/29/15 0411 04/30/15 0400  WBC 21.0* 16.9* 13.5*  HGB 11.9* 11.8* 10.9*  HCT 34.7* 35.3* 32.8*  PLT 152 120* 121*    Coag's  Recent Labs Lab 04/28/15 0332  04/29/15 0411 04/30/15 0400  APTT 141* 69* 63*    Sepsis Markers  Recent Labs Lab 2015-04-26 0357 2015/04/26 0550 04/26/15 1433  LATICACIDVEN 4.53* 2.7* 3.0*  PROCALCITON  --  0.16  --     ABG  Recent Labs Lab 04/29/15 0406 04/29/15 0534 04/30/15 0336  PHART 7.358 7.422 7.433  PCO2ART 41.5 38.9 35.5  PO2ART 158* 149.0* 115*    Liver Enzymes  Recent Labs Lab 04/27/15 0430  04/28/15 0332 04/28/15 1607 04/29/15 1556 04/30/15 0400  AST 942*  --  610*  --   --  183*  ALT 451*  --  364*  --   --  270*  ALKPHOS 103  --  103  --   --  121  BILITOT 10.8*  --  10.2*  --   --  8.2*  ALBUMIN 2.3*  < > 2.1* 2.0* 2.1* 2.0*  2.0*  < > = values in this interval not displayed.  Cardiac Enzymes  Recent Labs Lab 04/10/2015 1630 04/26/2015 0550  TROPONINI 0.06* 0.10*    Glucose  Recent Labs Lab 04/29/15 0747 04/29/15 1109 04/29/15 1530 04/29/15 1939 04/29/15 2336 04/30/15 0359  GLUCAP 151* 125* 208* 118* 169* 166*    Imaging Dg Chest Port 1 [address]  04/30/2015  CLINICAL DATA:  Intubation. EXAM: PORTABLE CHEST 1 VIEW COMPARISON:  04/29/2015. FINDINGS: Endotracheal tube in stable position. Swan-Ganz catheter in stable position with tip projected over the lower pulmonary artery. Cardiac device in stable position. NG tube appears to be coiled in the esophagus and is unchanged in position. Its tip is below the left hemidiaphragm . Stable cardiomegaly. Persistent bilateral pulmonary alveolar infiltrates are noted in the lung bases. Small pleural effusions are noted. No pneumothorax IMPRESSION: 1. Support apparatus in unchanged position as described above. 2. Stable cardiomegaly. Persistent bilateral basilar pulmonary infiltrates and bilateral pleural effusions are again noted. No interim change. Electronically Signed   By: Maisie Fushomas  Register   On: 04/30/2015 07:08     STUDIES:  3/29 Echo >> EF 20%, severe MR 3/29 RHC >> RA 10, RV 37/10, PA 04/03/14/26, PAOP 19, LV 92/71, CI  1.8 3/30 Renal u/s >> b/ renal cysts  CULTURES: 3/30 Blood >>  3/30 Sputum >> Group C Strep 3/30 Urine >> negative  ANTIBIOTICS: 3/30 Vancomycin >> 4/03 3/30 Zosyn >> 4/03 4/03 Nafcillin >>   SIGNIFICANT EVENTS: 3/29 Admit 3/30 cvvh started, IABP   LINES/TUBES: 3/29 ETT >> 3/29 Rt radial aline >>  3/29 PA catheter >> 3/30 Rt femoral HD cath >>    DISCUSSION: 65 yo male presented with progressive dyspnea, edema, orthopnea.  He was found to be in A fib and start on eliquis, beta blocker as outpt.  He developed progressive altered mental status and came to ER.  Found to have acute pulmonary edema from acute systolic CHF, AKI.  ASSESSMENT / PLAN:  PULMONARY A: Acute hypoxic respiratory failure 2nd to acute pulmonary edema and CAP. Former smoker. P:   Full vent support until hemodynamics more stable F/u CXR  CARDIOVASCULAR A: Acute systolic CHF. Cardiogenic shock. A fib with RVR. P:  Pressors, inotrope, PA catheter, IABP, amiodarone, heparin per cardiology  RENAL A:   AKI 2nd to ATN. P:   CVVH per renal   GASTROINTESTINAL A:   Elevated LFT's 2nd to shock >> improved. Nutrition. P:   Tube feeds while on vent Protonix for SUP  HEMATOLOGIC A:   Anemia, thrombocytopenia. P:  F/u CBC  INFECTIOUS A:   Group C Streptococcal pneumonia. P:   Day 5 of abx >> change to nafcillin 4/03  ENDOCRINE A:   Hyperglycemia - no hx DM >> HbA1C from 3/30 was 6.6. Hypothyroidism >> TSH from 3/28 was 6. P:   SSI  NEUROLOGIC A:   Acute metabolic encephalopathy. P:   RASS goal -1   Updated pt's cousin at bedside.  CC time 39 minutes.  Coralyn HellingVineet Ulyssa Walthour, MD [facility] 04/30/2015, 9:05 AM Pager:  [phone] After 3pm call: [phone]

## 2015-05-01 ENCOUNTER — Inpatient Hospital Stay (HOSPITAL_COMMUNITY): Payer: Medicare Other

## 2015-05-01 LAB — COMPREHENSIVE METABOLIC PANEL
ALT: 214 U/L — ABNORMAL HIGH (ref 17–63)
AST: 141 U/L — AB (ref 15–41)
Albumin: 1.9 g/dL — ABNORMAL LOW (ref 3.5–5.0)
Alkaline Phosphatase: 127 U/L — ABNORMAL HIGH (ref 38–126)
Anion gap: 12 (ref 5–15)
BILIRUBIN TOTAL: 12.7 mg/dL — AB (ref 0.3–1.2)
BUN: 25 mg/dL — AB (ref 6–20)
CHLORIDE: 97 mmol/L — AB (ref 101–111)
CO2: 22 mmol/L (ref 22–32)
CREATININE: 2.11 mg/dL — AB (ref 0.61–1.24)
Calcium: 7.4 mg/dL — ABNORMAL LOW (ref 8.9–10.3)
GFR, EST AFRICAN AMERICAN: 36 mL/min — AB (ref >60)
GFR, EST NON AFRICAN AMERICAN: 31 mL/min — AB (ref >60)
Glucose, Bld: 177 mg/dL — ABNORMAL HIGH (ref 65–99)
POTASSIUM: 4 mmol/L (ref 3.5–5.1)
Sodium: 131 mmol/L — ABNORMAL LOW (ref 135–145)
TOTAL PROTEIN: 5.7 g/dL — AB (ref 6.5–8.1)

## 2015-05-01 LAB — GLUCOSE, CAPILLARY
GLUCOSE-CAPILLARY: 191 mg/dL — AB (ref 65–99)
GLUCOSE-CAPILLARY: 147 mg/dL — AB (ref 65–99)
Glucose-Capillary: 165 mg/dL — ABNORMAL HIGH (ref 65–99)
Glucose-Capillary: 168 mg/dL — ABNORMAL HIGH (ref 65–99)
Glucose-Capillary: 195 mg/dL — ABNORMAL HIGH (ref 65–99)
Glucose-Capillary: 196 mg/dL — ABNORMAL HIGH (ref 65–99)

## 2015-05-01 LAB — CBC
HCT: 31.7 % — ABNORMAL LOW (ref 39.0–52.0)
HEMOGLOBIN: 10.5 g/dL — AB (ref 13.0–17.0)
MCH: 29.9 pg (ref 26.0–34.0)
MCHC: 33.1 g/dL (ref 30.0–36.0)
MCV: 90.3 fL (ref 78.0–100.0)
Platelets: 120 10*3/uL — ABNORMAL LOW (ref 150–400)
RBC: 3.51 MIL/uL — ABNORMAL LOW (ref 4.22–5.81)
RDW: 15.3 % (ref 11.5–15.5)
WBC: 13.7 10*3/uL — ABNORMAL HIGH (ref 4.0–10.5)

## 2015-05-01 LAB — POCT ACTIVATED CLOTTING TIME
ACTIVATED CLOTTING TIME: 173 s
ACTIVATED CLOTTING TIME: 178 s
ACTIVATED CLOTTING TIME: 178 s
Activated Clotting Time: 173 seconds
Activated Clotting Time: 173 seconds
Activated Clotting Time: 178 seconds

## 2015-05-01 LAB — CARBOXYHEMOGLOBIN
CARBOXYHEMOGLOBIN: 1.3 % (ref 0.5–1.5)
CARBOXYHEMOGLOBIN: 1.6 % — AB (ref 0.5–1.5)
Carboxyhemoglobin: 1.9 % — ABNORMAL HIGH (ref 0.5–1.5)
METHEMOGLOBIN: 1 % (ref 0.0–1.5)
Methemoglobin: 1 % (ref 0.0–1.5)
Methemoglobin: 1.1 % (ref 0.0–1.5)
O2 SAT: 77 %
O2 SAT: 74.2 %
O2 Saturation: 63.3 %
TOTAL HEMOGLOBIN: 10.5 g/dL — AB (ref 13.5–18.0)
TOTAL HEMOGLOBIN: 11.8 g/dL — AB (ref 13.5–18.0)
Total hemoglobin: 11.7 g/dL — ABNORMAL LOW (ref 13.5–18.0)

## 2015-05-01 LAB — CULTURE, BLOOD (ROUTINE X 2)
CULTURE: NO GROWTH
Culture: NO GROWTH

## 2015-05-01 LAB — RENAL FUNCTION PANEL
ANION GAP: 13 (ref 5–15)
Albumin: 2 g/dL — ABNORMAL LOW (ref 3.5–5.0)
BUN: 26 mg/dL — ABNORMAL HIGH (ref 6–20)
CALCIUM: 7.6 mg/dL — AB (ref 8.9–10.3)
CO2: 23 mmol/L (ref 22–32)
Chloride: 98 mmol/L — ABNORMAL LOW (ref 101–111)
Creatinine, Ser: 2.15 mg/dL — ABNORMAL HIGH (ref 0.61–1.24)
GFR calc non Af Amer: 31 mL/min — ABNORMAL LOW (ref >60)
GFR, EST AFRICAN AMERICAN: 35 mL/min — AB (ref >60)
GLUCOSE: 203 mg/dL — AB (ref 65–99)
POTASSIUM: 4.5 mmol/L (ref 3.5–5.1)
Phosphorus: 4.3 mg/dL (ref 2.5–4.6)
SODIUM: 134 mmol/L — AB (ref 135–145)

## 2015-05-01 LAB — PROTIME-INR
INR: 1.22 (ref 0.00–1.49)
Prothrombin Time: 15.5 seconds — ABNORMAL HIGH (ref 11.6–15.2)

## 2015-05-01 LAB — APTT: APTT: 63 s — AB (ref 24–37)

## 2015-05-01 LAB — LACTATE DEHYDROGENASE: LDH: 1292 U/L — AB (ref 98–192)

## 2015-05-01 LAB — PHOSPHORUS: Phosphorus: 3.4 mg/dL (ref 2.5–4.6)

## 2015-05-01 LAB — MAGNESIUM: MAGNESIUM: 2.5 mg/dL — AB (ref 1.7–2.4)

## 2015-05-01 MED ORDER — VITAL 1.5 CAL PO LIQD
1000.0000 mL | ORAL | Status: DC
  Administered 2015-05-01 – 2015-05-02 (×2): 1000 mL
  Filled 2015-05-01 (×3): qty 1000

## 2015-05-01 MED ORDER — VITAL 1.5 CAL PO LIQD
1000.0000 mL | ORAL | Status: DC
  Filled 2015-05-01 (×2): qty 1000

## 2015-05-01 MED ORDER — "THROMBI-PAD 3""X3"" EX PADS"
1.0000 | MEDICATED_PAD | Freq: Once | CUTANEOUS | Status: AC
  Administered 2015-05-01: 1 via TOPICAL
  Filled 2015-05-01: qty 1

## 2015-05-01 MED ORDER — PRO-STAT SUGAR FREE PO LIQD
60.0000 mL | Freq: Three times a day (TID) | ORAL | Status: DC
  Administered 2015-05-01 – 2015-05-02 (×5): 60 mL
  Filled 2015-05-01 (×6): qty 60

## 2015-05-01 NOTE — Progress Notes (Signed)
Advanced Heart Failure Rounding Note  PCP: Recently established with Iran Planas, PA-C - Family medicine (04/18/2015) Primary Cardiologist: New  Subjective:    Admitted 3/28 with cardiogenic shock.  He became critically worse with C.I. of 0.84 despite Impella and Dobutamine 1 on 3/29.  Dobutamine switched to milrinone as he was also getting amiodarone.  De-satted into 50% with profound respiratory fatigue and was intubated.   He developed progressive AKI and was started on CVVH.   Remains intubated and sedated. Off vasopressin, now on milrinone 0.25 and norepi 22 mcg. No urine output. CVVH pulling 75 /hour. WBC leveled off 13.7.   Co-ox 77%,  PLTs leveled off 120k. Patient wakes with sedation wean and follows commands.   TEE (3/29):  EF 5-10% diffuse hypokinesis, dilated RV with severely decreased systolic function, moderate AI, moderate MR, severe TR. Spontaneous left to right bubbles but unable to discover source (not ASD or VSD, ?pulmonary AVMs).   Swan numbers 0500 CVP 10 PA 32/29 PCWP which was PAD 29 Thermo CI = 1.8   Impella P-5    Objective:   Weight Range: 200 lb 2.8 oz (90.8 kg) Body mass index is 24.38 kg/(m^2).   Vital Signs:   Temp:  [98.2 F (36.8 C)-99 F (37.2 C)] 98.8 F (37.1 C) (04/04 0558) Pulse Rate:  [96-109] 105 (04/03 2353) Resp:  [17-24] 18 (04/04 0558) BP: (93-118)/(56-64) 110/56 mmHg (04/03 2353) SpO2:  [92 %-100 %] 100 % (04/04 0558) Arterial Line BP: (87-133)/(42-76) 106/63 mmHg (04/04 0558) FiO2 (%):  [40 %] 40 % (04/04 0600) Weight:  [200 lb 2.8 oz (90.8 kg)] 200 lb 2.8 oz (90.8 kg) (04/04 0600) Last BM Date: 04/29/15  Weight change: Filed Weights   04/29/15 0500 04/30/15 0400 05/01/15 0600  Weight: 210 lb 8.6 oz (95.5 kg) 206 lb 12.7 oz (93.8 kg) 200 lb 2.8 oz (90.8 kg)    Intake/Output:   Intake/Output Summary (Last 24 hours) at 05/01/15 0657 Last data filed at 05/01/15 0600  Gross per 24 hour  Intake 4206.7 ml  Output    6157 ml  Net -1950.3 ml     Physical Exam: CVP ~10 General:  Intubated, sedated. Eyes closes.  HEENT: normal Neck: supple. Carotids 2+ bilat; no bruits. No thyromegaly or lymphadenopathy.  Cor: PMI displaced laterally. Irregular rate & rhythm. +S3 Lungs: Mechanical breath sounds Abdomen: soft, NT, ND, no HSM. No bruits or masses. +BS  Extremities: warm  LLE. 3+ edema into thighs. Unable to palpate pedal pulses. R groin HD catheter-small amount of bleeding. Left groin ecchymosis stable. Impella sheath ok. LLE immobilizer in place. RLE compression stocking.  Neuro: Intubated, sedated.   Telemetry: Reviewed, Afib 100s   Labs: CBC  Recent Labs  04/30/15 0400 05/01/15 0407  WBC 13.5* 13.7*  HGB 10.9* 10.5*  HCT 32.8* 31.7*  MCV 89.9 90.3  PLT 121* 417*   Basic Metabolic Panel  Recent Labs  04/30/15 0400 04/30/15 1714 05/01/15 0407  NA 136  136 136 131*  K 3.9  3.9 4.1 4.0  CL 102  104 101 97*  CO2 _0 GLUCOSE 186*  182* 147* 177*  BUN 19  19 22* 25*  CREATININE 1.99*  1.98* 2.03* 2.11*  CALCIUM 7.7*  7.7* 7.7* 7.4*  MG 2.5*  --  2.5*  PHOS 2.0*  2.0* 3.0 3.4   Liver Function Tests  Recent Labs  04/30/15 0400 04/30/15 1714 05/01/15 0407  AST 183*  --  141*  ALT 270*  --  214*  ALKPHOS 121  --  127*  BILITOT 8.2*  --  12.7*  PROT 5.7*  --  5.7*  ALBUMIN 2.0*  2.0* 2.0* 1.9*   No results for input(s): LIPASE, AMYLASE in the last 72 hours. Cardiac Enzymes No results for input(s): CKTOTAL, CKMB, CKMBINDEX, TROPONINI in the last 72 hours.  BNP: BNP (last 3 results)  Recent Labs  04/12/2015 2338  BNP 612.8*    ProBNP (last 3 results) No results for input(s): PROBNP in the last 8760 hours.   D-Dimer No results for input(s): DDIMER in the last 72 hours. Hemoglobin A1C No results for input(s): HGBA1C in the last 72 hours. Fasting Lipid Panel  Recent Labs  04/28/15 1953  TRIG 133   Thyroid Function Tests No results for  input(s): TSH, T4TOTAL, T3FREE, THYROIDAB in the last 72 hours.  Invalid input(s): FREET3  Other results:     Imaging/Studies:  Dg Chest Port 1 View  04/30/2015  CLINICAL DATA:  Re-adjustment of Swan-Ganz catheter. EXAM: PORTABLE CHEST 1 VIEW COMPARISON:  Chest radiographs earlier this day. Please note examination is compared to chest radiographs with same time stamp 21:15, confirmed with technologist this exam was performed after previous chest radiograph. FINDINGS: The tip of the right Swan-Ganz catheter appears unchanged in position, located peripherally in the expected location of right lower lobe pulmonary artery. Endotracheal tube 4.3 cm from the carina. Tip and side port of the enteric tube below the diaphragm in the stomach. Cardiomediastinal contours are unchanged with stable cardiomegaly. Bilateral pleural effusions and bibasilar opacities. Pulmonary vasculature is unchanged. No pneumothorax. IMPRESSION: 1. Tip of the right Swan-Ganz catheter appears unchanged in position, peripherally located in the expected location of the right lower lobe pulmonary artery. 2. The exam is otherwise unchanged. Bilateral pleural effusions and bibasilar opacities again seen. Please note the timing of this exam was confirmed with technologist, chronologically out of order in PACS timeline. Electronically Signed   By: Jeb Levering M.D.   On: 04/30/2015 22:50   Dg Chest Port 1 View  04/30/2015  CLINICAL DATA:  Hypoxia EXAM: PORTABLE CHEST 1 VIEW COMPARISON:  Study obtained earlier in the day FINDINGS: Endotracheal tube tip is 3.9 cm above the carina. Swan-Ganz catheter tip is in the right lower lobe pulmonary artery, unchanged from 1 day prior. Nasogastric tube tip and side port are in the stomach. No pneumothorax. There are bilateral pleural effusions with patchy bibasilar atelectasis. Lungs elsewhere clear. Heart is slightly enlarged with pulmonary vascularity within normal limits. No adenopathy. No bone  lesions. IMPRESSION: Tube and catheter positions as described without pneumothorax. Note that the Swan-Ganz catheter tip remains rather peripherally positioned in the right lower lobe pulmonary artery. No pneumothorax. Bilateral effusions with bibasilar atelectasis, stable. Stable cardiac prominence. Electronically Signed   By: Lowella Grip III M.D.   On: 04/30/2015 21:27   Dg Chest Port 1 View  04/30/2015  CLINICAL DATA:  Intubation. EXAM: PORTABLE CHEST 1 VIEW COMPARISON:  04/29/2015. FINDINGS: Endotracheal tube in stable position. Swan-Ganz catheter in stable position with tip projected over the lower pulmonary artery. Cardiac device in stable position. NG tube appears to be coiled in the esophagus and is unchanged in position. Its tip is below the left hemidiaphragm . Stable cardiomegaly. Persistent bilateral pulmonary alveolar infiltrates are noted in the lung bases. Small pleural effusions are noted. No pneumothorax IMPRESSION: 1. Support apparatus in unchanged position as described above. 2. Stable cardiomegaly. Persistent bilateral basilar  pulmonary infiltrates and bilateral pleural effusions are again noted. No interim change. Electronically Signed   By: Marcello Moores  Register   On: 04/30/2015 07:08    Latest Echo  Latest Cath   Medications:     Scheduled Medications: . antiseptic oral rinse  7 mL Mouth Rinse 10 times per day  . chlorhexidine gluconate (SAGE KIT)  15 mL Mouth Rinse BID  . feeding supplement (PRO-STAT SUGAR FREE 64)  30 mL Per Tube TID  . insulin aspart  0-15 Units Subcutaneous 6 times per day  . nafcillin IV  2 g Intravenous 4 times per day  . pantoprazole sodium  40 mg Per Tube Q24H  . potassium & sodium phosphates  1 packet Oral BID  . sodium chloride flush  10-40 mL Intracatheter Q12H  . sodium chloride flush  3 mL Intravenous Q12H  . sodium chloride flush  3 mL Intravenous Q12H    Infusions: . amiodarone 30 mg/hr (05/01/15 0010)  . feeding supplement (VITAL  1.5 CAL) 1,000 mL (04/30/15 2313)  . fentaNYL infusion INTRAVENOUS 125 mcg/hr (04/30/15 2331)  . impella catheter heparin 50 unit/mL in dextrose 5%    . heparin 900 Units/hr (05/01/15 0030)  . milrinone 0.25 mcg/kg/min (05/01/15 0011)  . norepinephrine (LEVOPHED) Adult infusion 22 mcg/min (05/01/15 0242)  . dialysis replacement fluid (prismasate) 700 mL/hr at 05/01/15 0043  . dialysis replacement fluid (prismasate) 300 mL/hr at 05/01/15 0312  . dialysate (PRISMASATE) 1,500 mL/hr at 05/01/15 0352    PRN Medications: sodium chloride, sodium chloride, sodium chloride, acetaminophen, heparin, heparin, midazolam, ondansetron (ZOFRAN) IV, sodium chloride, sodium chloride flush, sodium chloride flush, sodium chloride flush   Assessment/Plan   Wesley Tyler is a 65 y.o. male sent to [facility] after PCP labs came back abnormal with mildly elevated troponin. Was found to be in florid HF and transferred to [facility] for further work up. He has no known medical history, as he has not seen an MD in > 40 years.  1. Acute systolic CHF/cardiogenic shock: Steadily worsening dyspnea over the last month, no clear trigger. Markedly worse 3/28, came to ER. EF 10% on bedside echo. Noted to be in atrial fibrillation and hypotensive, started initially on phenylephrine but transitioned over to dobutamine. No chest pain, slight troponin elevation is likely explained by cardiogenic shock/volume overload. Ddx for cardiomyopathy = coronary disease (?chronic) versus myocarditis versus tachy-mediated with atrial fibrillation of uncertain duration. Required Impella placement with low cardiac output on 3/29 (CI 1.3 by thermodilution on dobutamine).  He decompensated and was intubated. Remains on norepinephrine and milrinone, off vasopressin.  - CO-OX 77%. Continue milrinone 0.25 mcg/kg/min.  Wean norepinephrine as able.   - Continue UF via CVVH - pull as BP tolerates.  - Continue Impella support, now down to P5.  Good waveform,  no alarms. - If he recovers, will eventually need coronary angiography.  2. Acute hypoxic respiratory failure:  Remains intubated. Settings per CCM. Planning to continue until Impella removed.  3. AKI: Suspect ATN, cardiorenal/related to low output HF and hypotension. Currently on CVVH. 4. Atrial fibrillation: On heparin and amiodarone gtt for rate control.  Uncertain duration of atrial fibrillation, cannot rule out tachy-mediated CMP.  5. Endo: Elevated TSH and mildly elevated free T4. ?Sick euthyroid. Will follow.  6. Elevated LFTs: Suspect shock liver.  LFTs rose prior to amiodarone initiation but LFTs coming down. Suspect shock liver component as well  7. Hemolysis: Bilirubin trending up. Hemoglobin stable and LDH lower. PLTs leveled off 120. Impella  at P-5.  8. ID: Probable sepsis, fever 3/30, blood/sputm cultures sent. Blood cultures NGTD today. WBC coming down. Group C Strep in sputum cultures, now on nafcillin.  9. Hyponatremia- Na 131  Should be able to cut back P-4. Check CO-OX 2 hours later.   The patient is critically ill with multiple organ systems failure and requires high complexity decision making for assessment and support, frequent evaluation and titration of therapies, application of advanced monitoring technologies and extensive interpretation of multiple databases.   Length of Stay: [place] NP-C  05/01/2015, 6:57 AM  Advanced Heart Failure Team Pager [phone] (M-F; 7a - 4p)  Please contact [facility] for night-coverage after hours (4p -7a ) and weekends on [email or web address]  Patient seen with NP, agree with the above note.  Stable this morning. Swan not wedging.  CVP about 10.  Drawing off 75 cc/hr by UF. Net negative, weight down.  Good co-ox.    Agree with decrease to P4 today on Impella.  Continue milrinone and wean norepinephrine.    Hemoglobin and plts stable.    Likely to remain intubated until Impella out.   Nafcillin for Group C Strep in sputum.   35  minutes critical care time.   Loralie Champagne 05/01/2015 7:30 AM

## 2015-05-01 NOTE — Progress Notes (Signed)
Nutrition Follow-up  INTERVENTION:   Decrease Vital 1.5 to 40 ml/hr Increase Prostat to 60 ml TID Provides: 2040 kcal (100% of needs), 154 grams protein, and 733 ml H2O.   NUTRITION DIAGNOSIS:   Inadequate oral intake related to inability to eat as evidenced by energy intake < or equal to 50% for > or equal to 5 days. Ongoing.  GOAL:   Patient will meet greater than or equal to 90% of their needs Met.   MONITOR:   Labs, I & O's, Skin, Vent status, Weight trends  ASSESSMENT:   65 yo male presented with progressive dyspnea, edema, orthopnea. He was found to be in A fib and start on eliquis, beta blocker as outpt. He developed progressive altered mental status and came to ER. Found to have acute pulmonary edema from acute systolic CHF, AKI.  9/09 Intubated 3/30 CVVHD started, IABP  Patient is currently intubated on ventilator support MV: 10.9 L/min Temp (24hrs), Avg:98.5 F (36.9 C), Min:97.9 F (36.6 C), Max:99 F (37.2 C)  Per MD poor prognosis Labs reviewed: sodium low 131, Magnesium elevated 2.5, AST 141, ALT 214 CBG's: 147-191 OG tube last xray near GE junction  Diet Order:    NPO  Skin:  Reviewed, no issues  Last BM:  4/2  Height:   Ht Readings from Last 1 Encounters:  04/06/2015 6' 4"  (1.93 m)    Weight:   Wt Readings from Last 1 Encounters:  05/01/15 200 lb 2.8 oz (90.8 kg)    Ideal Body Weight:  91.81 kg  BMI:  Body mass index is 24.38 kg/(m^2).  Estimated Nutritional Needs:   Kcal:  2065  Protein:  136-181 grams  Fluid:  >/= 2L  EDUCATION NEEDS:   No education needs identified at this time  [place], [place], [place] Pager [phone] After Hours Pager

## 2015-05-01 NOTE — Progress Notes (Signed)
Notified Dr. Shirlee LatchMcLean of patient's CoOx, CI & CO on P3. Will continue to monitor Wesley Tyler, Wesley Tyler

## 2015-05-01 NOTE — Progress Notes (Signed)
PULMONARY / CRITICAL CARE MEDICINE   Name: Wesley DuffelJoseph Tyler MRN: [id number] DOB: 03/24/1950    ADMISSION DATE:  2016/01/04 CONSULTATION DATE:  04/22/2015  REFERRING MD:  EDP  CHIEF COMPLAINT:  SOB  SUBJECTIVE:  Remains on pressors, CRRT.   VITAL SIGNS: BP 110/56 mmHg  Pulse 105  Temp(Src) 98.4 F (36.9 C) (Core (Comment))  Resp 20  Ht 6\' 4"  (1.93 m)  Wt 200 lb 2.8 oz (90.8 kg)  BMI 24.38 kg/m2  SpO2 100%  HEMODYNAMICS: PAP: (28-64)/(22-53) 29/25 mmHg CVP:  [8 mmHg-30 mmHg] 9 mmHg PCWP:  [16 mmHg-29 mmHg] 28 mmHg CO:  [4.2 L/min-4.5 L/min] 4.2 L/min CI:  [1.8 L/min/m2-2 L/min/m2] 1.8 L/min/m2  VENTILATOR SETTINGS: Vent Mode:  [-] PRVC FiO2 (%):  [40 %] 40 % Set Rate:  [18 bmp] 18 bmp Vt Set:  [620 mL] 620 mL PEEP:  [5 cmH20] 5 cmH20 Plateau Pressure:  [14 cmH20-18 cmH20] 17 cmH20  INTAKE / OUTPUT: I/O last 3 completed shifts: In: 6131.7 [I.V.:2675.8; Other:360.9; NG/GT:2595; IV Piggyback:500] Out: 9008 [Other:9008]  PHYSICAL EXAMINATION: General: sedated Neuro: RASS 0, follows commands, moves all extremities HEENT: ETT in place Cardiac: regular, 2/6 murmur Chest: better air movement, no wheeze Abd: soft, non tender Ext: no edema Skin: no rashes  LABS:  BMET  Recent Labs Lab 04/30/15 0400 04/30/15 1714 05/01/15 0407  NA 136  136 136 131*  K 3.9  3.9 4.1 4.0  CL 102  104 101 97*  CO2 23  22 23 22   BUN 19  19 22* 25*  CREATININE 1.99*  1.98* 2.03* 2.11*  GLUCOSE 186*  182* 147* 177*    Electrolytes  Recent Labs Lab 04/29/15 0411  04/30/15 0400 04/30/15 1714 05/01/15 0407  CALCIUM 7.8*  < > 7.7*  7.7* 7.7* 7.4*  MG 2.5*  --  2.5*  --  2.5*  PHOS 3.0  < > 2.0*  2.0* 3.0 3.4  < > = values in this interval not displayed.  CBC  Recent Labs Lab 04/29/15 0411 04/30/15 0400 05/01/15 0407  WBC 16.9* 13.5* 13.7*  HGB 11.8* 10.9* 10.5*  HCT 35.3* 32.8* 31.7*  PLT 120* 121* 120*    Coag's  Recent Labs Lab 04/29/15 0411  04/30/15 0400 05/01/15 0407  APTT 69* 63* 63*  INR  --   --  1.22    Sepsis Markers  Recent Labs Lab 04/14/2015 0357 04/14/2015 0550 04/26/15 1433  LATICACIDVEN 4.53* 2.7* 3.0*  PROCALCITON  --  0.16  --     ABG  Recent Labs Lab 04/29/15 0406 04/29/15 0534 04/30/15 0336  PHART 7.358 7.422 7.433  PCO2ART 41.5 38.9 35.5  PO2ART 158* 149.0* 115*    Liver Enzymes  Recent Labs Lab 04/28/15 0332  04/30/15 0400 04/30/15 1714 05/01/15 0407  AST 610*  --  183*  --  141*  ALT 364*  --  270*  --  214*  ALKPHOS 103  --  121  --  127*  BILITOT 10.2*  --  8.2*  --  12.7*  ALBUMIN 2.1*  < > 2.0*  2.0* 2.0* 1.9*  < > = values in this interval not displayed.  Cardiac Enzymes  Recent Labs Lab Dec 02, 2015 1630 04/15/2015 0550  TROPONINI 0.06* 0.10*    Glucose  Recent Labs Lab 04/30/15 1238 04/30/15 1638 04/30/15 1943 05/01/15 0018 05/01/15 0348 05/01/15 0755  GLUCAP 150* 131* 143* 196* 165* 191*    Imaging Dg Chest Port 1 View  04/30/2015  CLINICAL DATA:  Re-adjustment of Swan-Ganz catheter. EXAM: PORTABLE CHEST 1 VIEW COMPARISON:  Chest radiographs earlier this day. Please note examination is compared to chest radiographs with same time stamp 21:15, confirmed with technologist this exam was performed after previous chest radiograph. FINDINGS: The tip of the right Swan-Ganz catheter appears unchanged in position, located peripherally in the expected location of right lower lobe pulmonary artery. Endotracheal tube 4.3 cm from the carina. Tip and side port of the enteric tube below the diaphragm in the stomach. Cardiomediastinal contours are unchanged with stable cardiomegaly. Bilateral pleural effusions and bibasilar opacities. Pulmonary vasculature is unchanged. No pneumothorax. IMPRESSION: 1. Tip of the right Swan-Ganz catheter appears unchanged in position, peripherally located in the expected location of the right lower lobe pulmonary artery. 2. The exam is otherwise  unchanged. Bilateral pleural effusions and bibasilar opacities again seen. Please note the timing of this exam was confirmed with technologist, chronologically out of order in PACS timeline. Electronically Signed   By: Rubye Oaks M.D.   On: 04/30/2015 22:50   Dg Chest Port 1 View  04/30/2015  CLINICAL DATA:  Hypoxia EXAM: PORTABLE CHEST 1 VIEW COMPARISON:  Study obtained earlier in the day FINDINGS: Endotracheal tube tip is 3.9 cm above the carina. Swan-Ganz catheter tip is in the right lower lobe pulmonary artery, unchanged from 1 day prior. Nasogastric tube tip and side port are in the stomach. No pneumothorax. There are bilateral pleural effusions with patchy bibasilar atelectasis. Lungs elsewhere clear. Heart is slightly enlarged with pulmonary vascularity within normal limits. No adenopathy. No bone lesions. IMPRESSION: Tube and catheter positions as described without pneumothorax. Note that the Swan-Ganz catheter tip remains rather peripherally positioned in the right lower lobe pulmonary artery. No pneumothorax. Bilateral effusions with bibasilar atelectasis, stable. Stable cardiac prominence. Electronically Signed   By: Bretta Bang III M.D.   On: 04/30/2015 21:27     STUDIES:  3/29 Echo >> EF 20%, severe MR 3/29 RHC >> RA 10, RV 37/10, PA 04/03/14/26, PAOP 19, LV 92/71, CI 1.8 3/30 Renal u/s >> b/ renal cysts  CULTURES: 3/30 Blood >>  3/30 Sputum >> Group C Strep 3/30 Urine >> negative  ANTIBIOTICS: 3/30 Vancomycin >> 4/03 3/30 Zosyn >> 4/03 4/03 Nafcillin >>   SIGNIFICANT EVENTS: 3/29 Admit 3/30 cvvh started, IABP   LINES/TUBES: 3/29 ETT >> 3/29 Rt radial aline >>  3/29 PA catheter >> 3/30 Rt femoral HD cath >>    DISCUSSION: 65 yo male presented with progressive dyspnea, edema, orthopnea.  He was found to be in A fib and start on eliquis, beta blocker as outpt.  He developed progressive altered mental status and came to ER.  Found to have acute pulmonary edema  from acute systolic CHF, AKI.  ASSESSMENT / PLAN:  PULMONARY A: Acute hypoxic respiratory failure 2nd to acute pulmonary edema and CAP. Former smoker. P:   Full vent support until hemodynamics more stable F/u CXR  CARDIOVASCULAR A: Acute systolic CHF. Cardiogenic shock. A fib with RVR. P:  Pressors, inotrope, PA catheter, IABP, amiodarone, heparin per cardiology  RENAL A:   AKI 2nd to ATN. P:   CVVH per renal   GASTROINTESTINAL A:   Elevated LFT's 2nd to shock >> improved. Nutrition. P:   Tube feeds while on vent Protonix for SUP F/u LFT's intermittently  HEMATOLOGIC A:   Anemia, thrombocytopenia. P:  F/u CBC  INFECTIOUS A:   Group C Streptococcal pneumonia. P:   Day 6 of abx >> changed to nafcillin 4/03  ENDOCRINE A:   Hyperglycemia - no hx DM >> HbA1C from 3/30 was 6.6. Hypothyroidism >> TSH from 3/28 was 6. P:   SSI  NEUROLOGIC A:   Acute metabolic encephalopathy. P:   RASS goal -1   Updated pt's cousin at bedside.  CC time 31 minutes.  Coralyn Helling, MD [facility] Pulmonary/Critical Care 05/01/2015, 8:18 AM Pager:  [phone] After 3pm call: [phone]

## 2015-05-01 NOTE — Progress Notes (Signed)
ANTICOAGULATION CONSULT NOTE - Follow Up Consult  Pharmacy Consult for heparin  Indication: s/p impella pump/afib  No Known Allergies  Patient Measurements: Height: 6' 4"  (193 cm) Weight: 200 lb 2.8 oz (90.8 kg) IBW/kg (Calculated) : 86.8   Vital Signs: Temp: 97.9 F (36.6 C) (04/04 1200) Temp Source: Core (Comment) (04/04 1200) BP: 91/56 mmHg (04/04 1130) Pulse Rate: 108 (04/04 1130)  Labs:  Recent Labs  04/29/15 0411  04/30/15 0400 04/30/15 1714 05/01/15 0407  HGB 11.8*  --  10.9*  --  10.5*  HCT 35.3*  --  32.8*  --  31.7*  PLT 120*  --  121*  --  120*  APTT 69*  --  63*  --  63*  LABPROT  --   --   --   --  15.5*  INR  --   --   --   --  1.22  CREATININE 2.08*  < > 1.99*  1.98* 2.03* 2.11*  < > = values in this interval not displayed.  Estimated Creatinine Clearance: 42.9 mL/min (by C-G formula based on Cr of 2.11).   Medications:  Scheduled:  . antiseptic oral rinse  7 mL Mouth Rinse 10 times per day  . chlorhexidine gluconate (SAGE KIT)  15 mL Mouth Rinse BID  . feeding supplement (PRO-STAT SUGAR FREE 64)  60 mL Per Tube TID  . [START ON 05/02/2015] feeding supplement (VITAL 1.5 CAL)  1,000 mL Per Tube Q24H  . insulin aspart  0-15 Units Subcutaneous 6 times per day  . nafcillin IV  2 g Intravenous 4 times per day  . pantoprazole sodium  40 mg Per Tube Q24H  . potassium & sodium phosphates  1 packet Oral BID  . sodium chloride flush  10-40 mL Intracatheter Q12H  . sodium chloride flush  3 mL Intravenous Q12H  . sodium chloride flush  3 mL Intravenous Q12H    Assessment: 65 yo male with cardiogenic shock s/p RHC. Cardiac index= 1.3 and he is now on an impella pump   Systemic heparin gtt restarted to maintain ACT 160-180sec ACT have been at goal Heparin drip 900 uts/hr aPTT 63 H/H stable PLTC slight drop 150-120s No bleeding noted Watch for hemolysis with impella  Goal of Therapy:  ACT= 160-180 Monitor platelets by anticoagulation protocol: Yes    Plan:  Coninue heparin  900 units/hr -Watch levels closely after initiation of CRRT -Will follow ACTs and purge rates -F/u plans for systemic anticoagulation after impella pulled.  Uvaldo Rising, BCPS  Clinical Pharmacist Pager [phone]  05/01/2015 2:03 PM

## 2015-05-01 NOTE — Progress Notes (Signed)
Dr. Shirlee LatchMclean made aware of SGC situation with dampened /wedged looking waveform. [facility] as long as the PA catheter was in the pulmonary artery then we can use it for CO but don't wedge.

## 2015-05-01 NOTE — Progress Notes (Signed)
CVVHD Management.  Metabolically stable, PO4 better.  Will try to pull 100cc/hr .  Overall prognosis is poor.

## 2015-05-01 NOTE — Progress Notes (Signed)
Put patient on P-3, per Dr. Alford HighlandMcLean's order (via Amy, NP).  WIll re-check CoOx & CO/CI at 1400. Webb[place]ford, Mitzi HansenJessica Marie

## 2015-05-02 ENCOUNTER — Inpatient Hospital Stay (HOSPITAL_COMMUNITY): Payer: Medicare Other

## 2015-05-02 LAB — POCT ACTIVATED CLOTTING TIME
ACTIVATED CLOTTING TIME: 188 s
ACTIVATED CLOTTING TIME: 178 s
ACTIVATED CLOTTING TIME: 157 s
ACTIVATED CLOTTING TIME: 152 s
Activated Clotting Time: 178 seconds
Activated Clotting Time: 178 seconds
Activated Clotting Time: 178 seconds

## 2015-05-02 LAB — RENAL FUNCTION PANEL
Albumin: 2.1 g/dL — ABNORMAL LOW (ref 3.5–5.0)
Albumin: 2.1 g/dL — ABNORMAL LOW (ref 3.5–5.0)
Anion gap: 14 (ref 5–15)
Anion gap: 14 (ref 5–15)
BUN: 30 mg/dL — ABNORMAL HIGH (ref 6–20)
BUN: 32 mg/dL — ABNORMAL HIGH (ref 6–20)
CHLORIDE: 97 mmol/L — AB (ref 101–111)
CHLORIDE: 99 mmol/L — AB (ref 101–111)
CO2: 25 mmol/L (ref 22–32)
CO2: 22 mmol/L (ref 22–32)
CREATININE: 2.22 mg/dL — AB (ref 0.61–1.24)
CREATININE: 2.27 mg/dL — AB (ref 0.61–1.24)
Calcium: 8 mg/dL — ABNORMAL LOW (ref 8.9–10.3)
Calcium: 8.2 mg/dL — ABNORMAL LOW (ref 8.9–10.3)
GFR calc non Af Amer: 29 mL/min — ABNORMAL LOW (ref >60)
GFR calc non Af Amer: 29 mL/min — ABNORMAL LOW (ref >60)
GFR, EST AFRICAN AMERICAN: 34 mL/min — AB (ref >60)
GFR, EST AFRICAN AMERICAN: 33 mL/min — AB (ref >60)
Glucose, Bld: 142 mg/dL — ABNORMAL HIGH (ref 65–99)
Glucose, Bld: 166 mg/dL — ABNORMAL HIGH (ref 65–99)
Phosphorus: 5.2 mg/dL — ABNORMAL HIGH (ref 2.5–4.6)
Phosphorus: 5.8 mg/dL — ABNORMAL HIGH (ref 2.5–4.6)
Potassium: 4.8 mmol/L (ref 3.5–5.1)
Potassium: 5.2 mmol/L — ABNORMAL HIGH (ref 3.5–5.1)
Sodium: 136 mmol/L (ref 135–145)
Sodium: 135 mmol/L (ref 135–145)

## 2015-05-02 LAB — CARBOXYHEMOGLOBIN
CARBOXYHEMOGLOBIN: 1.6 % — AB (ref 0.5–1.5)
Carboxyhemoglobin: 1.6 % — ABNORMAL HIGH (ref 0.5–1.5)
METHEMOGLOBIN: 1 % (ref 0.0–1.5)
Methemoglobin: 0.9 % (ref 0.0–1.5)
O2 SAT: 66.6 %
O2 Saturation: 66.8 %
TOTAL HEMOGLOBIN: 12.1 g/dL — AB (ref 13.5–18.0)
Total hemoglobin: 12.5 g/dL — ABNORMAL LOW (ref 13.5–18.0)

## 2015-05-02 LAB — CBC
HCT: 35.6 % — ABNORMAL LOW (ref 39.0–52.0)
Hemoglobin: 11.6 g/dL — ABNORMAL LOW (ref 13.0–17.0)
MCH: 29.7 pg (ref 26.0–34.0)
MCHC: 32.6 g/dL (ref 30.0–36.0)
MCV: 91 fL (ref 78.0–100.0)
PLATELETS: 136 10*3/uL — AB (ref 150–400)
RBC: 3.91 MIL/uL — AB (ref 4.22–5.81)
RDW: 16.2 % — ABNORMAL HIGH (ref 11.5–15.5)
WBC: 13.5 10*3/uL — ABNORMAL HIGH (ref 4.0–10.5)

## 2015-05-02 LAB — GLUCOSE, CAPILLARY
GLUCOSE-CAPILLARY: 131 mg/dL — AB (ref 65–99)
GLUCOSE-CAPILLARY: 142 mg/dL — AB (ref 65–99)
GLUCOSE-CAPILLARY: 142 mg/dL — AB (ref 65–99)
GLUCOSE-CAPILLARY: 153 mg/dL — AB (ref 65–99)
Glucose-Capillary: 147 mg/dL — ABNORMAL HIGH (ref 65–99)
Glucose-Capillary: 134 mg/dL — ABNORMAL HIGH (ref 65–99)

## 2015-05-02 LAB — COMPREHENSIVE METABOLIC PANEL
ALT: 185 U/L — ABNORMAL HIGH (ref 17–63)
ANION GAP: 14 (ref 5–15)
AST: 94 U/L — ABNORMAL HIGH (ref 15–41)
Albumin: 2.1 g/dL — ABNORMAL LOW (ref 3.5–5.0)
Alkaline Phosphatase: 144 U/L — ABNORMAL HIGH (ref 38–126)
BILIRUBIN TOTAL: 13.8 mg/dL — AB (ref 0.3–1.2)
BUN: 30 mg/dL — AB (ref 6–20)
CO2: 24 mmol/L (ref 22–32)
Calcium: 8 mg/dL — ABNORMAL LOW (ref 8.9–10.3)
Chloride: 97 mmol/L — ABNORMAL LOW (ref 101–111)
Creatinine, Ser: 2.26 mg/dL — ABNORMAL HIGH (ref 0.61–1.24)
GFR calc Af Amer: 33 mL/min — ABNORMAL LOW (ref >60)
GFR, EST NON AFRICAN AMERICAN: 29 mL/min — AB (ref >60)
Glucose, Bld: 137 mg/dL — ABNORMAL HIGH (ref 65–99)
POTASSIUM: 4.7 mmol/L (ref 3.5–5.1)
Sodium: 135 mmol/L (ref 135–145)
TOTAL PROTEIN: 6.4 g/dL — AB (ref 6.5–8.1)

## 2015-05-02 LAB — APTT: APTT: 71 s — AB (ref 24–37)

## 2015-05-02 LAB — MAGNESIUM: Magnesium: 2.7 mg/dL — ABNORMAL HIGH (ref 1.7–2.4)

## 2015-05-02 LAB — LACTATE DEHYDROGENASE: LDH: 1107 U/L — ABNORMAL HIGH (ref 98–192)

## 2015-05-02 MED ORDER — HEPARIN (PORCINE) IN NACL 100-0.45 UNIT/ML-% IJ SOLN
1300.0000 [IU]/h | INTRAMUSCULAR | Status: DC
  Administered 2015-05-02: 1300 [IU]/h via INTRAVENOUS
  Filled 2015-05-02: qty 250

## 2015-05-02 NOTE — Progress Notes (Signed)
CVVHD management.  PO 5.2 so will DC PO4 supp.  On levo and milrinone.  Getting 75-100cc/hr net fluid removal.  CXR somewhat improved

## 2015-05-02 NOTE — Care Management Important Message (Signed)
Important Message  Patient Details  Name: Wesley DuffelJoseph Prescott MRN: [id number] Date of Birth: 10/08/1950   Medicare Important Message Given:  Yes    Lasasha Brophy P Christine Schiefelbein 05/02/2015, 12:23 PM

## 2015-05-02 NOTE — Progress Notes (Signed)
   Impella turned down to P2 earlier this morning.    CO-OX stable at 66.6 %. Can stop heparin. D/C impella later today.   Levaughn Puccinelli NP-C  9:23 AM

## 2015-05-02 NOTE — Progress Notes (Signed)
Pharmacy Antibiotic Note  Wesley DuffelJoseph Tyler is a 65 y.o. male admitted on 04/07/2015 with cardiogenic shock requiring an impella. Pharmacy has been consulted for nafcillin dosing in patient on CRRT (no dosing adjustment needed due to hepatic clearance).  Per CCM note, planned length of therapy of 10 days of antibiotics.  Plan: 1) Nafcillin 2g IV q 6 hrs. 2) D/c nafcillin Sat 4/7?  Height: 6\' 4"  (193 cm) Weight: 195 lb 12.3 oz (88.8 kg) IBW/kg (Calculated) : 86.8  Temp (24hrs), Avg:98.4 F (36.9 C), Min:97.9 F (36.6 C), Max:99 F (37.2 C)   Recent Labs Lab 04/26/15 1433  04/28/15 0332  04/29/15 0411  04/30/15 0400 04/30/15 1714 05/01/15 0407 05/01/15 1600 05/02/15 0400  WBC  --   < > 21.0*  --  16.9*  --  13.5*  --  13.7*  --  13.5*  CREATININE  --   < > 2.48*  < > 2.08*  < > 1.99*  1.98* 2.03* 2.11* 2.15* 2.26*  2.22*  LATICACIDVEN 3.0*  --   --   --   --   --   --   --   --   --   --   < > = values in this interval not displayed.  Estimated Creatinine Clearance: 40.7 mL/min (by C-G formula based on Cr of 2.22).    No Known Allergies  Antimicrobials this admission: 3/30 Vanc>>4/3 3/30 Zosyn>>4/3 4/3 nafcillin> (planned thru Sat 4/7)  Dose adjustments this admission: n/a  Microbiology results: 3/30 urine>>neg 3/30 blood x2>>neg 3/30 resp>> group C strep  Thank you for allowing pharmacy to be a part of this patient's care.  Tad MooreJessica Nethan Caudillo, Pharm D, BCPS  Clinical Pharmacist Pager [phone]  05/02/2015 10:43 AM

## 2015-05-02 NOTE — Progress Notes (Signed)
Advanced Heart Failure Rounding Note  PCP: Recently established with Iran Planas, PA-C - Family medicine (04/14/2015) Primary Cardiologist: New  Subjective:    Admitted 3/28 with cardiogenic shock.  He became critically worse with C.I. of 0.84 despite Impella and Dobutamine 1 on 3/29.  Dobutamine switched to milrinone as he was also getting amiodarone.  De-satted into 50% with profound respiratory fatigue and was intubated.   He developed progressive AKI and was started on CVVH.   Remains intubated and sedated. Off vasopressin, now on milrinone 0.25 and norepi 17 mcg. No urine output. CVVH pulling 75 /hour. WBC leveled off 13.5. I/Os negative and weight down.   Co-ox 67%,  PLTs ok 136. Patient wakes with sedation wean and follows commands.   TEE (3/29):  EF 5-10% diffuse hypokinesis, dilated RV with severely decreased systolic function, moderate AI, moderate MR, severe TR. Spontaneous left to right bubbles but unable to discover source (not ASD or VSD, ?pulmonary AVMs).   Swan numbers 0500 CVP 11-12 PCWP which was PAD 26 Co-ox 67%  Impella P-3   Objective:   Weight Range: 195 lb 12.3 oz (88.8 kg) Body mass index is 23.84 kg/(m^2).   Vital Signs:   Temp:  [97.9 F (36.6 C)-99 F (37.2 C)] 98.4 F (36.9 C) (04/05 0600) Pulse Rate:  [107-108] 107 (04/04 1624) Resp:  [16-23] 18 (04/05 0600) BP: (73-99)/(49-62) 73/49 mmHg (04/04 1624) SpO2:  [98 %-100 %] 100 % (04/05 0600) Arterial Line BP: (73-121)/(47-69) 114/67 mmHg (04/05 0630) FiO2 (%):  [40 %] 40 % (04/05 0412) Weight:  [195 lb 12.3 oz (88.8 kg)] 195 lb 12.3 oz (88.8 kg) (04/05 0400) Last BM Date: 04/29/15  Weight change: Filed Weights   04/30/15 0400 05/01/15 0600 05/02/15 0400  Weight: 206 lb 12.7 oz (93.8 kg) 200 lb 2.8 oz (90.8 kg) 195 lb 12.3 oz (88.8 kg)    Intake/Output:   Intake/Output Summary (Last 24 hours) at 05/02/15 0653 Last data filed at 05/02/15 0600  Gross per 24 hour  Intake 4155.7 ml    Output   6150 ml  Net -1994.3 ml     Physical Exam: CVP 11-12 General:  Intubated, sedated. Eyes closes.  HEENT: normal Neck: supple. Carotids 2+ bilat; no bruits. No thyromegaly or lymphadenopathy.  Cor: PMI displaced laterally. Irregular rate & rhythm. +S3 Lungs: Mechanical breath sounds Abdomen: soft, NT, ND, no HSM. No bruits or masses. +BS  Extremities: warm  LLE. 2+ edema into thighs. Unable to palpate pedal pulses. R groin HD catheter-small amount of bleeding. Left groin ecchymosis stable. Impella sheath ok. LLE immobilizer in place. RLE compression stocking.  Neuro: Intubated, sedated.   Telemetry: Reviewed, Afib 100s   Labs: CBC  Recent Labs  05/01/15 0407 05/02/15 0400  WBC 13.7* 13.5*  HGB 10.5* 11.6*  HCT 31.7* 35.6*  MCV 90.3 91.0  PLT 120* 782*   Basic Metabolic Panel  Recent Labs  05/01/15 0407 05/01/15 1600 05/02/15 0400  NA 131* 134* 135  136  K 4.0 4.5 4.7  4.8  CL 97* 98* 97*  97*  CO2 22 23 24  25   GLUCOSE 177* 203* 137*  142*  BUN 25* 26* 30*  30*  CREATININE 2.11* 2.15* 2.26*  2.22*  CALCIUM 7.4* 7.6* 8.0*  8.0*  MG 2.5*  --  2.7*  PHOS 3.4 4.3 5.2*   Liver Function Tests  Recent Labs  05/01/15 0407 05/01/15 1600 05/02/15 0400  AST 141*  --  94*  ALT  214*  --  185*  ALKPHOS 127*  --  144*  BILITOT 12.7*  --  13.8*  PROT 5.7*  --  6.4*  ALBUMIN 1.9* 2.0* 2.1*  2.1*   No results for input(s): LIPASE, AMYLASE in the last 72 hours. Cardiac Enzymes No results for input(s): CKTOTAL, CKMB, CKMBINDEX, TROPONINI in the last 72 hours.  BNP: BNP (last 3 results)  Recent Labs  04/15/2015 2338  BNP 612.8*    ProBNP (last 3 results) No results for input(s): PROBNP in the last 8760 hours.   D-Dimer No results for input(s): DDIMER in the last 72 hours. Hemoglobin A1C No results for input(s): HGBA1C in the last 72 hours. Fasting Lipid Panel No results for input(s): CHOL, HDL, LDLCALC, TRIG, CHOLHDL, LDLDIRECT in the  last 72 hours. Thyroid Function Tests No results for input(s): TSH, T4TOTAL, T3FREE, THYROIDAB in the last 72 hours.  Invalid input(s): FREET3  Other results:     Imaging/Studies:  Dg Chest Port 1 View  04/30/2015  CLINICAL DATA:  Re-adjustment of Swan-Ganz catheter. EXAM: PORTABLE CHEST 1 VIEW COMPARISON:  Chest radiographs earlier this day. Please note examination is compared to chest radiographs with same time stamp 21:15, confirmed with technologist this exam was performed after previous chest radiograph. FINDINGS: The tip of the right Swan-Ganz catheter appears unchanged in position, located peripherally in the expected location of right lower lobe pulmonary artery. Endotracheal tube 4.3 cm from the carina. Tip and side port of the enteric tube below the diaphragm in the stomach. Cardiomediastinal contours are unchanged with stable cardiomegaly. Bilateral pleural effusions and bibasilar opacities. Pulmonary vasculature is unchanged. No pneumothorax. IMPRESSION: 1. Tip of the right Swan-Ganz catheter appears unchanged in position, peripherally located in the expected location of the right lower lobe pulmonary artery. 2. The exam is otherwise unchanged. Bilateral pleural effusions and bibasilar opacities again seen. Please note the timing of this exam was confirmed with technologist, chronologically out of order in PACS timeline. Electronically Signed   By: Jeb Levering M.D.   On: 04/30/2015 22:50   Dg Chest Port 1 View  04/30/2015  CLINICAL DATA:  Hypoxia EXAM: PORTABLE CHEST 1 VIEW COMPARISON:  Study obtained earlier in the day FINDINGS: Endotracheal tube tip is 3.9 cm above the carina. Swan-Ganz catheter tip is in the right lower lobe pulmonary artery, unchanged from 1 day prior. Nasogastric tube tip and side port are in the stomach. No pneumothorax. There are bilateral pleural effusions with patchy bibasilar atelectasis. Lungs elsewhere clear. Heart is slightly enlarged with pulmonary  vascularity within normal limits. No adenopathy. No bone lesions. IMPRESSION: Tube and catheter positions as described without pneumothorax. Note that the Swan-Ganz catheter tip remains rather peripherally positioned in the right lower lobe pulmonary artery. No pneumothorax. Bilateral effusions with bibasilar atelectasis, stable. Stable cardiac prominence. Electronically Signed   By: Lowella Grip III M.D.   On: 04/30/2015 21:27   Dg Abd Portable 1v  05/02/2015  CLINICAL DATA:  Encounter for orogastric tube placement. EXAM: PORTABLE ABDOMEN - 1 VIEW COMPARISON:  04/27/2015 FINDINGS: Side-port of the enteric tube just beyond the gastroesophageal junction, side-port in the region of the body. Right presumed femoral catheter, tip to the right side of L5-S1. Impella device projects over the cardiac silhouette in the lower thorax. Gaseous distention of bowel loops appears similar to prior exam. This appears to be majority colonic distention. No evidence of free air on portable supine view. IMPRESSION: Tip of the enteric tube in the stomach, side-port just  beyond the gastroesophageal junction. Gaseous distension of bowel loops, primarily colon, similar to prior exams. Electronically Signed   By: Jeb Levering M.D.   On: 05/02/2015 02:33    Latest Echo  Latest Cath   Medications:     Scheduled Medications: . antiseptic oral rinse  7 mL Mouth Rinse 10 times per day  . chlorhexidine gluconate (SAGE KIT)  15 mL Mouth Rinse BID  . feeding supplement (PRO-STAT SUGAR FREE 64)  60 mL Per Tube TID  . feeding supplement (VITAL 1.5 CAL)  1,000 mL Per Tube Q24H  . insulin aspart  0-15 Units Subcutaneous 6 times per day  . nafcillin IV  2 g Intravenous 4 times per day  . pantoprazole sodium  40 mg Per Tube Q24H  . potassium & sodium phosphates  1 packet Oral BID  . sodium chloride flush  10-40 mL Intracatheter Q12H  . sodium chloride flush  3 mL Intravenous Q12H  . sodium chloride flush  3 mL Intravenous  Q12H    Infusions: . amiodarone 30 mg/hr (05/02/15 0024)  . fentaNYL infusion INTRAVENOUS 125 mcg/hr (05/01/15 2000)  . impella catheter heparin 50 unit/mL in dextrose 5%    . heparin 800 Units/hr (05/02/15 0535)  . milrinone 0.25 mcg/kg/min (05/02/15 0345)  . norepinephrine (LEVOPHED) Adult infusion 18 mcg/min (05/02/15 0539)  . dialysis replacement fluid (prismasate) 700 mL/hr at 05/02/15 0554  . dialysis replacement fluid (prismasate) 300 mL/hr at 05/01/15 2009  . dialysate (PRISMASATE) 1,500 mL/hr at 05/02/15 0019    PRN Medications: sodium chloride, sodium chloride, sodium chloride, acetaminophen, heparin, heparin, midazolam, ondansetron (ZOFRAN) IV, sodium chloride, sodium chloride flush, sodium chloride flush, sodium chloride flush   Assessment/Plan   Wesley Tyler is a 66 y.o. male sent to [facility] after PCP labs came back abnormal with mildly elevated troponin. Was found to be in florid HF and transferred to [facility] for further work up. He has no known medical history, as he has not seen an MD in > 40 years.  1. Acute systolic CHF/cardiogenic shock: Steadily worsening dyspnea over the last month, no clear trigger. Markedly worse 3/28, came to ER. EF 10% on bedside echo. Noted to be in atrial fibrillation and hypotensive, started initially on phenylephrine but transitioned over to dobutamine. No chest pain, slight troponin elevation is likely explained by cardiogenic shock/volume overload. Ddx for cardiomyopathy = coronary disease (?chronic) versus myocarditis versus tachy-mediated with atrial fibrillation of uncertain duration. Required Impella placement with low cardiac output on 3/29 (CI 1.3 by thermodilution on dobutamine).  He decompensated and was intubated. Remains on norepinephrine and milrinone, off vasopressin.  - CO-OX 67%. Continue milrinone 0.25 mcg/kg/min.  Wean norepinephrine as able, suspect can bring down today.   - Can remove Swan today, follow co-ox and CVP via  sheath.  - Continue UF via CVVH - pull as BP tolerates.  - Continue Impella support, now down to P3.  Good waveform, no alarms. Will wean to P2 today.  If co-ox remains stable, remove Impella. - If he recovers, will eventually need coronary angiography.  2. Acute hypoxic respiratory failure:  Remains intubated. Settings per CCM. Planning to continue until Impella removed.  3. AKI: Suspect ATN, cardiorenal/related to low output HF and hypotension. Currently on CVVH. 4. Atrial fibrillation: On heparin and amiodarone gtt for rate control.  Uncertain duration of atrial fibrillation, cannot rule out tachy-mediated CMP. Will plan eventually TEE-guided DCCV. 5. Endo: Elevated TSH and mildly elevated free T4. ?Sick euthyroid. Will follow.  6. Elevated LFTs: Suspect shock liver.  LFTs rose prior to amiodarone initiation but LFTs coming down. Suspect shock liver component as well  7. Hemolysis: Bilirubin trending up. Hemoglobin stable and LDH lower. PLTs stable 135 . Impella at P-3.  8. ID: Probable sepsis, fever 3/30, blood/sputm cultures sent. Blood cultures NGTD today. WBC coming down. Group C Strep in sputum cultures, now on nafcillin.  9. Hyponatremia- Na 135  Cut back to P-2. Check CO-OX 2 hours later.  If stable, will remove Impella later today.   The patient is critically ill with multiple organ systems failure and requires high complexity decision making for assessment and support, frequent evaluation and titration of therapies, application of advanced monitoring technologies and extensive interpretation of multiple databases.   Length of Stay: [place] NP-C  05/02/2015, 6:53 AM  Advanced Heart Failure Team Pager [phone] (M-F; 7a - 4p)  Please contact [facility] for night-coverage after hours (4p -7a ) and weekends on [email or web address]  Patient seen with NP, agree with the above note.  He is stable this morning.   - Wean norepinephrine, suspect he has BP room for this.  - Impella to P2  => check co-ox, if ok will hold heparin and remove later today.  - Continue milrinone gtt.  - Continue CVVH, UF as tolerates, he has been net negative.  - Remove Swan, leave in introducer and follow co-ox and CVP. - Nafcillin for Group C Strep PNA.  - Eventual TEE-guided DCCV, continues on amiodarone and in atrial fibrillation.   40 minutes critical care time.   Loralie Champagne 05/02/2015 7:49 AM

## 2015-05-02 NOTE — Progress Notes (Signed)
eLink Physician-Brief Progress Note Patient Name: Wesley Tyler DOB: 06/29/1950 MRN: [id number]   Date of Service  05/02/2015  HPI/Events of Note  NEW TUBE IN STOMACH D/W rn, CAN USE  eICU Interventions       Intervention Category Minor Interventions: Routine modifications to care plan (e.g. PRN medications for pain, fever)  Nelda BucksFEINSTEIN,Kamara Allan J. 05/02/2015, 3:33 AM

## 2015-05-02 NOTE — Progress Notes (Signed)
PULMONARY / CRITICAL CARE MEDICINE   Name: Wesley Tyler MRN: [id number] DOB: 03/13/1950    ADMISSION DATE:  Jun 12, 2015 CONSULTATION DATE:  04/23/2015  REFERRING MD:  EDP  CHIEF COMPLAINT:  SOB  SUBJECTIVE:  Weaning off pressors.  Remains on CRRT.    VITAL SIGNS: BP 73/49 mmHg  Pulse 107  Temp(Src) 98.6 F (37 C) (Core (Comment))  Resp 18  Ht 6\' 4"  (1.93 m)  Wt 195 lb 12.3 oz (88.8 kg)  BMI 23.84 kg/m2  SpO2 100%  HEMODYNAMICS: PAP: (26-50)/(16-32) 35/16 mmHg CVP:  [5 mmHg-17 mmHg] 7 mmHg CO:  [3.9 L/min-4.4 L/min] 3.9 L/min CI:  [1.7 L/min/m2-1.9 L/min/m2] 1.7 L/min/m2  VENTILATOR SETTINGS: Vent Mode:  [-] PRVC FiO2 (%):  [40 %] 40 % Set Rate:  [18 bmp] 18 bmp Vt Set:  [620 mL] 620 mL PEEP:  [5 cmH20] 5 cmH20 Pressure Support:  [5 cmH20] 5 cmH20 Plateau Pressure:  [14 cmH20-18 cmH20] 16 cmH20  INTAKE / OUTPUT: I/O last 3 completed shifts: In: 6063 [I.V.:2660.6; Other:394.2; NG/GT:2408.3; IV Piggyback:600] Out: 8871 [Other:8871]  PHYSICAL EXAMINATION: General: sedated Neuro: RASS 0, follows commands, moves all extremities HEENT: ETT in place Cardiac: regular, 2/6 murmur Chest: better air movement, no wheeze Abd: soft, non tender Ext: no edema Skin: no rashes  LABS:  BMET  Recent Labs Lab 05/01/15 0407 05/01/15 1600 05/02/15 0400  NA 131* 134* 135  136  K 4.0 4.5 4.7  4.8  CL 97* 98* 97*  97*  CO2 22 23 24  25   BUN 25* 26* 30*  30*  CREATININE 2.11* 2.15* 2.26*  2.22*  GLUCOSE 177* 203* 137*  142*    Electrolytes  Recent Labs Lab 04/30/15 0400  05/01/15 0407 05/01/15 1600 05/02/15 0400  CALCIUM 7.7*  7.7*  < > 7.4* 7.6* 8.0*  8.0*  MG 2.5*  --  2.5*  --  2.7*  PHOS 2.0*  2.0*  < > 3.4 4.3 5.2*  < > = values in this interval not displayed.  CBC  Recent Labs Lab 04/30/15 0400 05/01/15 0407 05/02/15 0400  WBC 13.5* 13.7* 13.5*  HGB 10.9* 10.5* 11.6*  HCT 32.8* 31.7* 35.6*  PLT 121* 120* 136*    Coag's  Recent  Labs Lab 04/30/15 0400 05/01/15 0407 05/02/15 0400  APTT 63* 63* 71*  INR  --  1.22  --     Sepsis Markers  Recent Labs Lab 04/26/15 1433  LATICACIDVEN 3.0*    ABG  Recent Labs Lab 04/29/15 0406 04/29/15 0534 04/30/15 0336  PHART 7.358 7.422 7.433  PCO2ART 41.5 38.9 35.5  PO2ART 158* 149.0* 115*    Liver Enzymes  Recent Labs Lab 04/30/15 0400  05/01/15 0407 05/01/15 1600 05/02/15 0400  AST 183*  --  141*  --  94*  ALT 270*  --  214*  --  185*  ALKPHOS 121  --  127*  --  144*  BILITOT 8.2*  --  12.7*  --  13.8*  ALBUMIN 2.0*  2.0*  < > 1.9* 2.0* 2.1*  2.1*  < > = values in this interval not displayed.   Glucose  Recent Labs Lab 05/01/15 1138 05/01/15 1557 05/01/15 2001 05/02/15 0002 05/02/15 0355 05/02/15 0755  GLUCAP 147* 195* 168* 153* 131* 142*    Imaging Dg Chest Port 1 View  05/02/2015  CLINICAL DATA:  Hypoxia EXAM: PORTABLE CHEST 1 VIEW COMPARISON:  April 30, 2015 FINDINGS: Endotracheal tube tip is 5.0 cm above the carina. Swan-Ganz catheter  tip remains in the right lower lobe pulmonary artery. Nasogastric tube tip and side port are in the stomach. There is no appreciable pneumothorax. There is patchy atelectatic change in both lower lobes with small right effusion. Small left effusion has resolved. No new opacity. There is cardiomegaly, stable. The pulmonary vascularity is within normal limits. No adenopathy. IMPRESSION: Tube and catheter positions as described without pneumothorax. Again, the Swan-Ganz catheter tip is somewhat peripheral in location, well within the right lower lobe pulmonary artery. Small right effusion with bibasilar atelectasis. No new opacity. No change in cardiac silhouette. Electronically Signed   By: Bretta Bang III M.D.   On: 05/02/2015 07:03   Dg Abd Portable 1v  05/02/2015  CLINICAL DATA:  Encounter for orogastric tube placement. EXAM: PORTABLE ABDOMEN - 1 VIEW COMPARISON:  04/27/2015 FINDINGS: Side-port of the  enteric tube just beyond the gastroesophageal junction, side-port in the region of the body. Right presumed femoral catheter, tip to the right side of L5-S1. Impella device projects over the cardiac silhouette in the lower thorax. Gaseous distention of bowel loops appears similar to prior exam. This appears to be majority colonic distention. No evidence of free air on portable supine view. IMPRESSION: Tip of the enteric tube in the stomach, side-port just beyond the gastroesophageal junction. Gaseous distension of bowel loops, primarily colon, similar to prior exams. Electronically Signed   By: Rubye Oaks M.D.   On: 05/02/2015 02:33     STUDIES:  3/29 Echo >> EF 20%, severe MR 3/29 RHC >> RA 10, RV 37/10, PA 04/03/14/26, PAOP 19, LV 92/71, CI 1.8 3/30 Renal u/s >> b/ renal cysts  CULTURES: 3/30 Blood >> negative 3/30 Sputum >> Group C Strep 3/30 Urine >> negative  ANTIBIOTICS: 3/30 Vancomycin >> 4/03 3/30 Zosyn >> 4/03 4/03 Nafcillin >>   SIGNIFICANT EVENTS: 3/29 Admit 3/30 cvvh started, IABP   LINES/TUBES: 3/29 ETT >> 3/29 Rt radial aline >>  3/29 PA catheter >> 3/30 Rt femoral HD cath >>    DISCUSSION: 65 yo male presented with progressive dyspnea, edema, orthopnea.  He was found to be in A fib and start on eliquis, beta blocker as outpt.  He developed progressive altered mental status and came to ER.  Found to have acute pulmonary edema from acute systolic CHF, AKI.  ASSESSMENT / PLAN:  PULMONARY A: Acute hypoxic respiratory failure 2nd to acute pulmonary edema and CAP. Former smoker. P:   Full vent support until hemodynamics more stable >> anticipate he will be ready for vent weaning later this week F/u CXR  CARDIOVASCULAR A: Acute systolic CHF. Cardiogenic shock. A fib with RVR. P:  Pressors, inotrope, amiodarone, heparin per cardiology Plan to d/c PA catheter and IABP on 4/05 per cardiology  RENAL A:   AKI 2nd to ATN. P:   CVVH per renal    GASTROINTESTINAL A:   Elevated LFT's 2nd to shock >> improved. Nutrition. P:   Tube feeds while on vent Protonix for SUP F/u LFT's intermittently  HEMATOLOGIC A:   Anemia, thrombocytopenia. P:  F/u CBC  INFECTIOUS A:   Group C Streptococcal pneumonia. P:   Day 7/10 of abx >> changed to nafcillin 4/03  ENDOCRINE A:   Hyperglycemia - no hx DM >> HbA1C from 3/30 was 6.6. Hypothyroidism >> TSH from 3/28 was 6. P:   SSI  NEUROLOGIC A:   Acute metabolic encephalopathy. P:   RASS goal -1   Updated pt's cousin at bedside.  CC time 33 minutes.  Coralyn Helling, MD [facility] Pulmonary/Critical Care 05/02/2015, 8:27 AM Pager:  [phone] After 3pm call: [phone]

## 2015-05-02 NOTE — Progress Notes (Signed)
ANTICOAGULATION CONSULT NOTE - Follow Up Consult  Pharmacy Consult for heparin  Indication: s/p impella pump/afib  No Known Allergies  Patient Measurements: Height: _0  (193 cm) Weight: 195 lb 12.3 oz (88.8 kg) IBW/kg (Calculated) : 86.8   Vital Signs: Temp: 98.2 F (36.8 C) (04/05 0945) Temp Source: Core (Comment) (04/05 0600) Pulse Rate: 105 (04/05 0945)  Labs:  Recent Labs  04/30/15 0400  05/01/15 0407 05/01/15 1600 05/02/15 0400  HGB 10.9*  --  10.5*  --  11.6*  HCT 32.8*  --  31.7*  --  35.6*  PLT 121*  --  120*  --  136*  APTT 63*  --  63*  --  71*  LABPROT  --   --  15.5*  --   --   INR  --   --  1.22  --   --   CREATININE 1.99*  1.98*  < > 2.11* 2.15* 2.26*  2.22*  < > = values in this interval not displayed.  Estimated Creatinine Clearance: 40.7 mL/min (by C-G formula based on Cr of 2.22).   Medications:  Scheduled:  . antiseptic oral rinse  7 mL Mouth Rinse 10 times per day  . chlorhexidine gluconate (SAGE KIT)  15 mL Mouth Rinse BID  . feeding supplement (PRO-STAT SUGAR FREE 64)  60 mL Per Tube TID  . feeding supplement (VITAL 1.5 CAL)  1,000 mL Per Tube Q24H  . insulin aspart  0-15 Units Subcutaneous 6 times per day  . nafcillin IV  2 g Intravenous 4 times per day  . pantoprazole sodium  40 mg Per Tube Q24H  . sodium chloride flush  10-40 mL Intracatheter Q12H  . sodium chloride flush  3 mL Intravenous Q12H  . sodium chloride flush  3 mL Intravenous Q12H    Assessment: 65 yo male with cardiogenic shock s/p RHC. Cardiac index= 1.3 and he is now on an impella pump   Systemic heparin gtt restarted to maintain ACT 160-180sec ACT have been at goal Heparin drip 800 uts/hr aPTT 71 H/H stable PLTC slight drop 150-120s No bleeding noted Watch for hemolysis with impella  Goal of Therapy:  ACT= 160-180 Monitor platelets by anticoagulation protocol: Yes   Plan:  -Hold systemic heparin to pull Impella. -F/u plans for systemic anticoagulation  after Impella pulled.  Will need to resume for afib.  Uvaldo Rising, BCPS  Clinical Pharmacist Pager [phone]  05/02/2015 10:41 AM

## 2015-05-02 NOTE — Progress Notes (Addendum)
RN entered patient's room and found OG tube on patient's floor. OG tube replaced and abdominal xray ordered for confirmation of placement.   0300 - xray result states OG tube in stomach.  Called eLink, spoke with MD to confirm OG tube placement.  0330- MD confirmed OG placement.  Tube feeds restarted

## 2015-05-02 NOTE — Progress Notes (Signed)
Dr. Shirlee LatchMclean at bedside to remove impella. Impella removed at 1330.This RN resumed holding pressure for Dr. Shirlee LatchMclean at 1335. Manual pressure held for ~9145min. Levo increased to 15mcg to give pt more pain and sedation. VSS throughout  procedure. Site level 1 due to previous ecchymosis. No new hematoma palpated. Will continue to monitor and assess pt closely.

## 2015-05-02 NOTE — Progress Notes (Signed)
Impella device removed without complication.  Pressure to be held x 45 minutes.  Will restart heparin gtt 8 hours after Impella out if site stable.   Wesley AnconaDalton Gabrial Tyler 05/02/2015 1:40 PM

## 2015-05-02 NOTE — Progress Notes (Signed)
ANTICOAGULATION CONSULT NOTE - Follow Up Consult  Pharmacy Consult for heparin  Indication: D/c impella; afib  No Known Allergies  Patient Measurements: Height: _0  (193 cm) Weight: 195 lb 12.3 oz (88.8 kg) IBW/kg (Calculated) : 86.8   Vital Signs: Temp: 98.2 F (36.8 C) (04/05 0945) Temp Source: Core (Comment) (04/05 0600) BP: 106/65 mmHg (04/05 1223) Pulse Rate: 113 (04/05 1223)  Labs:  Recent Labs  04/30/15 0400  05/01/15 0407 05/01/15 1600 05/02/15 0400  HGB 10.9*  --  10.5*  --  11.6*  HCT 32.8*  --  31.7*  --  35.6*  PLT 121*  --  120*  --  136*  APTT 63*  --  63*  --  71*  LABPROT  --   --  15.5*  --   --   INR  --   --  1.22  --   --   CREATININE 1.99*  1.98*  < > 2.11* 2.15* 2.26*  2.22*  < > = values in this interval not displayed.  Estimated Creatinine Clearance: 40.7 mL/min (by C-G formula based on Cr of 2.22).   Medications:  Scheduled:  . antiseptic oral rinse  7 mL Mouth Rinse 10 times per day  . chlorhexidine gluconate (SAGE KIT)  15 mL Mouth Rinse BID  . feeding supplement (PRO-STAT SUGAR FREE 64)  60 mL Per Tube TID  . feeding supplement (VITAL 1.5 CAL)  1,000 mL Per Tube Q24H  . insulin aspart  0-15 Units Subcutaneous 6 times per day  . nafcillin IV  2 g Intravenous 4 times per day  . pantoprazole sodium  40 mg Per Tube Q24H  . sodium chloride flush  10-40 mL Intracatheter Q12H  . sodium chloride flush  3 mL Intravenous Q12H  . sodium chloride flush  3 mL Intravenous Q12H    Assessment: 65 yo male with cardiogenic shock s/p RHC. Cardiac index= 1.3 and he is now on an impella pump   Impella pump now removed, heparin currently on hold.  Asked to resume systemic heparin in 8 hrs if site is stable.  Previously with therapeutic ACTs with total heparin ~1300-1400 units/hr (purge solution + systemic heparin).  Pltc low but stable.  Goal of Therapy:  Heparin level 0.3-0.7 Monitor platelets by anticoagulation protocol: Yes   Plan:  -If  Impella groin site looks stable at 2200 pm, will restart systemic IV heparin at rate of 1300 units/hr.   -Check heparin level 6 hrs after heparin resumes. -Daily heparin level and CBC. -F/u plans for oral anticoagulation eventually.  Uvaldo Rising, BCPS  Clinical Pharmacist Pager [phone]  05/02/2015 2:32 PM

## 2015-05-03 ENCOUNTER — Inpatient Hospital Stay (HOSPITAL_COMMUNITY): Payer: Medicare Other

## 2015-05-03 LAB — RENAL FUNCTION PANEL
ALBUMIN: 2.1 g/dL — AB (ref 3.5–5.0)
ALBUMIN: 1.9 g/dL — AB (ref 3.5–5.0)
ANION GAP: 13 (ref 5–15)
Anion gap: 14 (ref 5–15)
BUN: 41 mg/dL — AB (ref 6–20)
BUN: 38 mg/dL — AB (ref 6–20)
CHLORIDE: 97 mmol/L — AB (ref 101–111)
CO2: 24 mmol/L (ref 22–32)
CO2: 21 mmol/L — ABNORMAL LOW (ref 22–32)
Calcium: 8.4 mg/dL — ABNORMAL LOW (ref 8.9–10.3)
Calcium: 8.1 mg/dL — ABNORMAL LOW (ref 8.9–10.3)
Chloride: 100 mmol/L — ABNORMAL LOW (ref 101–111)
Creatinine, Ser: 2.31 mg/dL — ABNORMAL HIGH (ref 0.61–1.24)
Creatinine, Ser: 2.4 mg/dL — ABNORMAL HIGH (ref 0.61–1.24)
GFR calc Af Amer: 32 mL/min — ABNORMAL LOW (ref >60)
GFR calc Af Amer: 31 mL/min — ABNORMAL LOW (ref >60)
GFR calc non Af Amer: 28 mL/min — ABNORMAL LOW (ref >60)
GFR, EST NON AFRICAN AMERICAN: 27 mL/min — AB (ref >60)
GLUCOSE: 146 mg/dL — AB (ref 65–99)
Glucose, Bld: 186 mg/dL — ABNORMAL HIGH (ref 65–99)
PHOSPHORUS: 4.9 mg/dL — AB (ref 2.5–4.6)
POTASSIUM: 5.3 mmol/L — AB (ref 3.5–5.1)
POTASSIUM: 4.6 mmol/L (ref 3.5–5.1)
Phosphorus: 5.7 mg/dL — ABNORMAL HIGH (ref 2.5–4.6)
Sodium: 135 mmol/L (ref 135–145)
Sodium: 134 mmol/L — ABNORMAL LOW (ref 135–145)

## 2015-05-03 LAB — COMPREHENSIVE METABOLIC PANEL
ALBUMIN: 2.1 g/dL — AB (ref 3.5–5.0)
ALK PHOS: 153 U/L — AB (ref 38–126)
ALT: 153 U/L — AB (ref 17–63)
AST: 76 U/L — ABNORMAL HIGH (ref 15–41)
Anion gap: 14 (ref 5–15)
BUN: 40 mg/dL — ABNORMAL HIGH (ref 6–20)
CALCIUM: 8.4 mg/dL — AB (ref 8.9–10.3)
CO2: 24 mmol/L (ref 22–32)
CREATININE: 2.31 mg/dL — AB (ref 0.61–1.24)
Chloride: 97 mmol/L — ABNORMAL LOW (ref 101–111)
GFR calc Af Amer: 32 mL/min — ABNORMAL LOW (ref >60)
GFR calc non Af Amer: 28 mL/min — ABNORMAL LOW (ref >60)
GLUCOSE: 147 mg/dL — AB (ref 65–99)
Potassium: 5.3 mmol/L — ABNORMAL HIGH (ref 3.5–5.1)
SODIUM: 135 mmol/L (ref 135–145)
Total Bilirubin: 12.6 mg/dL — ABNORMAL HIGH (ref 0.3–1.2)
Total Protein: 6.8 g/dL (ref 6.5–8.1)

## 2015-05-03 LAB — GLUCOSE, CAPILLARY
GLUCOSE-CAPILLARY: 163 mg/dL — AB (ref 65–99)
GLUCOSE-CAPILLARY: 124 mg/dL — AB (ref 65–99)
GLUCOSE-CAPILLARY: 141 mg/dL — AB (ref 65–99)
GLUCOSE-CAPILLARY: 106 mg/dL — AB (ref 65–99)
Glucose-Capillary: 176 mg/dL — ABNORMAL HIGH (ref 65–99)
Glucose-Capillary: 168 mg/dL — ABNORMAL HIGH (ref 65–99)
Glucose-Capillary: 114 mg/dL — ABNORMAL HIGH (ref 65–99)

## 2015-05-03 LAB — CBC
HEMATOCRIT: 38.2 % — AB (ref 39.0–52.0)
Hemoglobin: 12.6 g/dL — ABNORMAL LOW (ref 13.0–17.0)
MCH: 31 pg (ref 26.0–34.0)
MCHC: 33 g/dL (ref 30.0–36.0)
MCV: 94.1 fL (ref 78.0–100.0)
Platelets: 169 10*3/uL (ref 150–400)
RBC: 4.06 MIL/uL — ABNORMAL LOW (ref 4.22–5.81)
RDW: 18.1 % — AB (ref 11.5–15.5)
WBC: 14.8 10*3/uL — ABNORMAL HIGH (ref 4.0–10.5)

## 2015-05-03 LAB — HEPARIN LEVEL (UNFRACTIONATED)
HEPARIN UNFRACTIONATED: 0.29 [IU]/mL — AB (ref 0.30–0.70)
Heparin Unfractionated: <0.1 IU/mL — ABNORMAL LOW (ref 0.30–0.70)

## 2015-05-03 LAB — MAGNESIUM: Magnesium: 2.9 mg/dL — ABNORMAL HIGH (ref 1.7–2.4)

## 2015-05-03 LAB — CARBOXYHEMOGLOBIN
Carboxyhemoglobin: 1 % (ref 0.5–1.5)
Methemoglobin: 1 % (ref 0.0–1.5)
O2 Saturation: 61.6 %
Total hemoglobin: 12.4 g/dL — ABNORMAL LOW (ref 13.5–18.0)

## 2015-05-03 LAB — APTT: aPTT: 51 seconds — ABNORMAL HIGH (ref 24–37)

## 2015-05-03 LAB — LACTATE DEHYDROGENASE: LDH: 950 U/L — ABNORMAL HIGH (ref 98–192)

## 2015-05-03 MED ORDER — BISACODYL 10 MG RE SUPP
10.0000 mg | Freq: Once | RECTAL | Status: AC
  Administered 2015-05-03: 10 mg via RECTAL
  Filled 2015-05-03: qty 1

## 2015-05-03 MED ORDER — CETYLPYRIDINIUM CHLORIDE 0.05 % MT LIQD
7.0000 mL | Freq: Two times a day (BID) | OROMUCOSAL | Status: DC
  Administered 2015-05-03 – 2015-05-06 (×6): 7 mL via OROMUCOSAL

## 2015-05-03 MED ORDER — CHLORHEXIDINE GLUCONATE 0.12 % MT SOLN
15.0000 mL | Freq: Two times a day (BID) | OROMUCOSAL | Status: DC
  Administered 2015-05-03 – 2015-05-06 (×6): 15 mL via OROMUCOSAL
  Filled 2015-05-03 (×2): qty 15

## 2015-05-03 MED ORDER — HEPARIN (PORCINE) IN NACL 100-0.45 UNIT/ML-% IJ SOLN
1650.0000 [IU]/h | INTRAMUSCULAR | Status: DC
  Administered 2015-05-03 – 2015-05-04 (×2): 1500 [IU]/h via INTRAVENOUS
  Administered 2015-05-06: 1650 [IU]/h via INTRAVENOUS
  Filled 2015-05-03 (×4): qty 250

## 2015-05-03 MED ORDER — RESOURCE THICKENUP CLEAR PO POWD
ORAL | Status: DC | PRN
  Filled 2015-05-03: qty 125

## 2015-05-03 MED ORDER — MIDODRINE HCL 5 MG PO TABS
5.0000 mg | ORAL_TABLET | Freq: Three times a day (TID) | ORAL | Status: DC
  Administered 2015-05-03 – 2015-05-04 (×3): 5 mg via ORAL
  Filled 2015-05-03 (×4): qty 1

## 2015-05-03 MED ORDER — PANTOPRAZOLE SODIUM 40 MG IV SOLR: 40.0000 mg | INTRAVENOUS | Status: DC

## 2015-05-03 MED ORDER — FENTANYL CITRATE (PF) 100 MCG/2ML IJ SOLN
25.0000 ug | INTRAMUSCULAR | Status: DC | PRN
  Administered 2015-05-06: 25 ug via INTRAVENOUS
  Filled 2015-05-03: qty 2

## 2015-05-03 NOTE — Evaluation (Signed)
Clinical/Bedside Swallow Evaluation Patient Details  Name: Wesley DuffelJoseph Tyler MRN: [id number] Date of Birth: 02/21/1950  Today's Date: 05/03/2015 Time: SLP Start Time (ACUTE ONLY): 1500 SLP Stop Time (ACUTE ONLY): 1515 SLP Time Calculation (min) (ACUTE ONLY): 15 min  Past Medical History: History reviewed. No pertinent past medical history. Past Surgical History:  Past Surgical History  Procedure Laterality Date  . Wisdom tooth extraction    . Cardiac catheterization N/A 04/20/2015    Procedure: Right Heart Cath;  Surgeon: Laurey Moralealton S McLean, MD;  [facility];  Service: Cardiovascular;  Laterality: N/A;   HPI:  [age] yo male presented with progressive dyspnea, edema, orthopnea. He was found to be in A fib and start on eliquis, beta blocker as outpt. He developed progressive altered mental status and came to ER. Found to have acute pulmonary edema from acute systolic CHF, AKI, CVVHD.  Intubated 3/29-05/03/15.     Assessment / Plan / Recommendation Clinical Impression  Pt presents with surprisingly strong swallow function despite eight-day oral intubation: there was adequate mastication, brisk swallow response, mild coughing after 3 oz water test, but adequate toleration of nectar-thick liquids.  Recommend initiating a dysphagia 3 diet with nectar thick liquids; meds whole in puree.  Supervise with meals until pt is able to feed himself safely and independently.  SLP will follow for safety and progression with diet.      Aspiration Risk  Mild aspiration risk    Diet Recommendation   dysphagia 3, nectars  Medication Administration: Whole meds with puree    Other  Recommendations Oral Care Recommendations: Oral care BID Other Recommendations: Order thickener from pharmacy   Follow up Recommendations   (tba)    Frequency and Duration min 2x/week  2 weeks       Prognosis        Swallow Study   General Date of Onset: 04/12/2015 HPI: 65 yo male presented with progressive  dyspnea, edema, orthopnea. He was found to be in A fib and start on eliquis, beta blocker as outpt. He developed progressive altered mental status and came to ER. Found to have acute pulmonary edema from acute systolic CHF, AKI, CVVHD.  Intubated 3/29-05/03/15.   Type of Study: Bedside Swallow Evaluation Previous Swallow Assessment: no Diet Prior to this Study: NPO Temperature Spikes Noted: No Respiratory Status: Nasal cannula History of Recent Intubation: Yes Length of Intubations (days): 8 days Date extubated: 05/03/15 Behavior/Cognition: Alert;Cooperative;Confused Oral Cavity Assessment: Dried secretions Oral Care Completed by SLP: Recent completion by staff Oral Cavity - Dentition: Adequate natural dentition;Missing dentition (loose lower central incisor) Vision: Functional for self-feeding Self-Feeding Abilities: Able to feed self;Needs assist Patient Positioning: Upright in bed Baseline Vocal Quality: Normal Volitional Cough: Strong Volitional Swallow: Able to elicit    Oral/Motor/Sensory Function Overall Oral Motor/Sensory Function: Within functional limits   Ice Chips Ice chips: Within functional limits   Thin Liquid Thin Liquid: Impaired Presentation: Cup;Self Fed Pharyngeal  Phase Impairments: Cough - Delayed    Nectar Thick Nectar Thick Liquid: Within functional limits Presentation: Cup;Self Fed   Honey Thick Honey Thick Liquid: Not tested   Puree Puree: Within functional limits   Solid   GO   Solid: Within functional limits       Ndeye Tenorio L. Samson Fredericouture, [place] CCC/SLP Pager [phone]  Blenda MountsCouture, Deyon Chizek Laurice 05/03/2015,3:37 PM

## 2015-05-03 NOTE — Progress Notes (Signed)
PULMONARY / CRITICAL CARE MEDICINE   Name: Wesley Tyler MRN: [id number] DOB: 02/28/1950    ADMISSION DATE:  04/27/2015 CONSULTATION DATE:  Mar 25, 2015  REFERRING MD:  EDP  CHIEF COMPLAINT:  SOB  SUBJECTIVE:  Tolerating pressure support.  VITAL SIGNS: BP 113/75 mmHg  Pulse 108  Temp(Src) 97.5 F (36.4 C) (Oral)  Resp 14  Ht 6\' 4"  (1.93 m)  Wt 188 lb 0.8 oz (85.3 kg)  BMI 22.90 kg/m2  SpO2 100%  HEMODYNAMICS: PAP: (36-41)/(19-21) 39/20 mmHg CVP:  [7 mmHg-11 mmHg] 8 mmHg  VENTILATOR SETTINGS: Vent Mode:  [-] PSV;CPAP FiO2 (%):  [40 %] 40 % Set Rate:  [14 bmp] 14 bmp Vt Set:  [620 mL] 620 mL PEEP:  [5 cmH20] 5 cmH20 Pressure Support:  [5 cmH20] 5 cmH20 Plateau Pressure:  [10 cmH20-17 cmH20] 10 cmH20  INTAKE / OUTPUT: I/O last 3 completed shifts: In: 5721.4 [I.V.:2550; NWGNF:621.3Other:216.4; YQ/MV:7846; NG/GT:2355; IV Piggyback:600] Out: 9629 [BMWUX:3244]8613 [Other:8613]  PHYSICAL EXAMINATION: General: sedated Neuro: RASS 0, follows commands, moves all extremities HEENT: ETT in place Cardiac: regular, 2/6 murmur Chest: better air movement, no wheeze Abd: mild distention, increased tympany Ext: no edema Skin: no rashes  LABS:  BMET  Recent Labs Lab 05/02/15 0400 05/02/15 1550 05/03/15 0450  NA 135  136 135 135  135  K 4.7  4.8 5.2* 5.3*  5.3*  CL 97*  97* 99* 97*  97*  CO2 24  25 22 24  24   BUN 30*  30* 32* 40*  41*  CREATININE 2.26*  2.22* 2.27* 2.31*  2.31*  GLUCOSE 137*  142* 166* 147*  146*    Electrolytes  Recent Labs Lab 05/01/15 0407  05/02/15 0400 05/02/15 1550 05/03/15 0450  CALCIUM 7.4*  < > 8.0*  8.0* 8.2* 8.4*  8.4*  MG 2.5*  --  2.7*  --  2.9*  PHOS 3.4  < > 5.2* 5.8* 5.7*  < > = values in this interval not displayed.  CBC  Recent Labs Lab 05/01/15 0407 05/02/15 0400 05/03/15 0747  WBC 13.7* 13.5* 14.8*  HGB 10.5* 11.6* 12.6*  HCT 31.7* 35.6* 38.2*  PLT 120* 136* 169    Coag's  Recent Labs Lab 05/01/15 0407 05/02/15 0400  05/03/15 0450  APTT 63* 71* 51*  INR 1.22  --   --     Sepsis Markers  Recent Labs Lab 04/26/15 1433  LATICACIDVEN 3.0*    ABG  Recent Labs Lab 04/29/15 0406 04/29/15 0534 04/30/15 0336  PHART 7.358 7.422 7.433  PCO2ART 41.5 38.9 35.5  PO2ART 158* 149.0* 115*    Liver Enzymes  Recent Labs Lab 05/01/15 0407  05/02/15 0400 05/02/15 1550 05/03/15 0450  AST 141*  --  94*  --  76*  ALT 214*  --  185*  --  153*  ALKPHOS 127*  --  144*  --  153*  BILITOT 12.7*  --  13.8*  --  12.6*  ALBUMIN 1.9*  < > 2.1*  2.1* 2.1* 2.1*  2.1*  < > = values in this interval not displayed.   Glucose  Recent Labs Lab 05/02/15 1112 05/02/15 1548 05/02/15 2003 05/03/15 0103 05/03/15 0452 05/03/15 0740  GLUCAP 147* 142* 134* 163* 124* 176*    Imaging Dg Chest Port 1 View  05/03/2015  CLINICAL DATA:  Respiratory failure. EXAM: PORTABLE CHEST 1 VIEW COMPARISON:  05/02/2015. FINDINGS: Endotracheal tube, NG tube, right IJ sheath in stable position. Swan-Ganz catheter has been removed. Cardiomegaly with pulmonary vascular prominence  and bilateral pulmonary infiltrates consistent with pulmonary edema. Similar finding noted on prior exam. Small right pleural effusion. IMPRESSION: 1. Interim removal Swan-Ganz catheter. Remaining lines and tubes in stable position. 2. Persistent mild cardiomegaly with bilateral mild basilar pulmonary infiltrates and small right pleural effusions suggesting congestive heart failure. No interim change. Electronically Signed   By: Maisie Fus  Register   On: 05/03/2015 07:14   Dg Abd Portable 1v  05/03/2015  CLINICAL DATA:  Abdominal distention EXAM: PORTABLE ABDOMEN - 1 VIEW COMPARISON:  Yesterday FINDINGS: Nasogastric tube tip overlapping the stomach. Right femoral sheath with tip in stable position near the right common femoral vein. Persistent prominent colonic gas without obstructive pattern. IMPRESSION: 1. Nasogastric tube tip overlaps the stomach. 2. Unchanged  prominent colonic gas without obstructive pattern. Electronically Signed   By: Marnee Spring M.D.   On: 05/03/2015 08:11     STUDIES:  3/29 Echo >> EF 20%, severe MR 3/29 RHC >> RA 10, RV 37/10, PA 04/03/14/26, PAOP 19, LV 92/71, CI 1.8 3/30 Renal u/s >> b/ renal cysts  CULTURES: 3/30 Blood >> negative 3/30 Sputum >> Group C Strep 3/30 Urine >> negative  ANTIBIOTICS: 3/30 Vancomycin >> 4/03 3/30 Zosyn >> 4/03 4/03 Nafcillin >>   SIGNIFICANT EVENTS: 3/29 Admit 3/30 cvvh started, IABP  4/05 PA catheter, IABP out  LINES/TUBES: 3/29 ETT >> 3/29 Rt radial aline >>  3/29 PA catheter >> 4/05 3/30 Rt femoral HD cath >>    DISCUSSION: 65 yo male presented with progressive dyspnea, edema, orthopnea.  He was found to be in A fib and start on eliquis, beta blocker as outpt.  He developed progressive altered mental status and came to ER.  Found to have acute pulmonary edema from acute systolic CHF, AKI.  ASSESSMENT / PLAN:  PULMONARY A: Acute hypoxic respiratory failure 2nd to acute pulmonary edema and CAP. Former smoker. P:   Pressure support wean >> might be ready for extubation trial soon F/u CXR  CARDIOVASCULAR A: Acute systolic CHF. Cardiogenic shock. A fib with RVR. P:  Pressors, inotrope, amiodarone, heparin per cardiology  RENAL A:   AKI 2nd to ATN. P:   CVVH per renal   GASTROINTESTINAL A:   Elevated LFT's 2nd to shock >> improved. Nutrition. Abdominal distention noted 4/06. P:   Limit narcotics Speech to assess swallowing after extubation Bowel regimen Protonix for SUP F/u LFT's intermittently  HEMATOLOGIC A:   Anemia, thrombocytopenia. P:  F/u CBC  INFECTIOUS A:   Group C Streptococcal pneumonia. P:   Day 8/10 of abx >> changed to nafcillin 4/03  ENDOCRINE A:   Hyperglycemia - no hx DM >> HbA1C from 3/30 was 6.6. Hypothyroidism >> TSH from 3/28 was 6. P:   SSI  NEUROLOGIC A:   Acute metabolic encephalopathy >> improved. P:    RASS goal 0   Updated pt's cousin at bedside.  CC time 32 minutes.  Coralyn Helling, MD [facility] Pulmonary/Critical Care 05/03/2015, 9:10 AM Pager:  [phone] After 3pm call: [phone]

## 2015-05-03 NOTE — Procedures (Signed)
Extubation Procedure Note  Patient Details:   Name: Wesley DuffelJoseph Tyler DOB: 03/25/1950 MRN: [id number]   Pt extubated to 4L [place] per MD order, VS WNL. Pt able to vocalize, no stridor noted. Pt tolerating well at this time. RT will continue to monitor.     Evaluation  O2 sats: stable throughout Complications: No apparent complications Patient did tolerate procedure well. Bilateral Breath Sounds: Clear, Diminished   Yes  [name], Wesley Tyler Wesley Tyler 05/03/2015, 9:29 AM

## 2015-05-03 NOTE — Progress Notes (Signed)
CVVHD Management. Metabolically stable.  Remains on levo and milrinone.  K 5.3 but will cont with 4K for now.  Pulling 75-100cc/hr.  ? Repeating echo and see if EF still as poor as if so then prognosis grim.

## 2015-05-03 NOTE — Progress Notes (Signed)
Fentanyl gtt- 50 ml wasted in sink by Rocco Paulsara Jeda Pardue RN and Chapman MossSamantha Rouse RN

## 2015-05-03 NOTE — Progress Notes (Signed)
ANTICOAGULATION CONSULT NOTE - Follow Up Consult  Pharmacy Consult for heparin  Indication: afib  No Known Allergies  Patient Measurements: Height: 6\' 4"  (193 cm) Weight: 188 lb 0.8 oz (85.3 kg) IBW/kg (Calculated) : 86.8   Vital Signs: Temp: 97.5 F (36.4 C) (04/06 0800) Temp Source: Oral (04/06 0800) BP: 94/75 mmHg (04/06 1600) Pulse Rate: 106 (04/06 0927)  Labs:  Recent Labs  05/01/15 0407  05/02/15 0400 05/02/15 1550 05/03/15 0450 05/03/15 0747 05/03/15 1500  HGB 10.5*  --  11.6*  --   --  12.6*  --   HCT 31.7*  --  35.6*  --   --  38.2*  --   PLT 120*  --  136*  --   --  169  --   APTT 63*  --  71*  --  51*  --   --   LABPROT 15.5*  --   --   --   --   --   --   INR 1.22  --   --   --   --   --   --   HEPARINUNFRC  --   --   --   --  <0.10*  --  0.29*  CREATININE 2.11*  < > 2.26*  2.22* 2.27* 2.31*  2.31*  --   --   < > = values in this interval not displayed.  Estimated Creatinine Clearance: 38.5 mL/min (by C-G formula based on Cr of 2.31).   Medications:  Scheduled:  . antiseptic oral rinse  7 mL Mouth Rinse q12n4p  . chlorhexidine  15 mL Mouth Rinse BID  . insulin aspart  0-15 Units Subcutaneous 6 times per day  . midodrine  5 mg Oral 3 times per day  . nafcillin IV  2 g Intravenous 4 times per day  . pantoprazole (PROTONIX) IV  40 mg Intravenous Q24H  . sodium chloride flush  10-40 mL Intracatheter Q12H    Assessment: 65 yo male with cardiogenic shock s/p RHC. Impella placed until 4/5;  now removed. Previously with therapeutic ACTs with total heparin ~1300-1400 units/hr (purge solution + systemic heparin).   Pharmacy asked to resume systemic heparin, heparin level 0.29, slightly below goal on 1450 units/hr.   Goal of Therapy:  Heparin level 0.3-0.7 Monitor platelets by anticoagulation protocol: Yes   Plan:  -Increase IV heparin rate slightly to 1500 units/hr -Recheck heparin level in AM -Daily heparin level and CBC. -F/u plans for oral  anticoagulation eventually.  Bayard HuggerMei Dontrelle Mazon, PharmD, BCPS  Clinical Pharmacist  Pager: [phone]    05/03/2015 4:12 PM

## 2015-05-03 NOTE — Progress Notes (Signed)
Advanced Heart Failure Rounding Note  PCP: Recently established with Iran Planas, PA-C - Family medicine (04/23/2015) Primary Cardiologist: New  Subjective:    Admitted 3/28 with cardiogenic shock.  He became critically worse with C.I. of 0.84 despite Impella and Dobutamine 1 on 3/29.  Dobutamine switched to milrinone as he was also getting amiodarone.  De-satted into 50% with profound respiratory fatigue and was intubated.   He developed progressive AKI and was started on CVVH.   Impella and Swan removed 4/5. Remains intubated and sedated. Off vasopressin, now on milrinone 0.25 and norepi  91mg. No urine output. CVVH pulling 75 /hour. No BM in 4 days.   Co-ox 62%, CVP 8.  Patient wakes with sedation wean and follows commands.   TEE (3/29):  EF 5-10% diffuse hypokinesis, dilated RV with severely decreased systolic function, moderate AI, moderate MR, severe TR. Spontaneous left to right bubbles but unable to discover source (not ASD or VSD, ?pulmonary AVMs).     Objective:   Weight Range: 188 lb 0.8 oz (85.3 kg) Body mass index is 22.9 kg/(m^2).   Vital Signs:   Temp:  [97.8 F (36.6 C)-98.6 F (37 C)] 98.2 F (36.8 C) (04/06 0300) Pulse Rate:  [98-113] 104 (04/06 0338) Resp:  [13-26] 15 (04/06 0700) BP: (81-116)/(60-91) 104/67 mmHg (04/06 0338) SpO2:  [95 %-100 %] 100 % (04/06 0700) Arterial Line BP: (71-127)/(49-79) 96/61 mmHg (04/06 0700) FiO2 (%):  [40 %] 40 % (04/06 0338) Weight:  [188 lb 0.8 oz (85.3 kg)] 188 lb 0.8 oz (85.3 kg) (04/06 0500) Last BM Date: 04/29/15  Weight change: Filed Weights   05/01/15 0600 05/02/15 0400 05/03/15 0500  Weight: 200 lb 2.8 oz (90.8 kg) 195 lb 12.3 oz (88.8 kg) 188 lb 0.8 oz (85.3 kg)    Intake/Output:   Intake/Output Summary (Last 24 hours) at 05/03/15 0707 Last data filed at 05/03/15 05885 Gross per 24 hour  Intake 3656.78 ml  Output   5465 ml  Net -1808.22 ml     Physical Exam: CVP 7-8  General:  Intubated,  sedated. Eyes closes.  HEENT: normal Neck: supple. Carotids 2+ bilat; no bruits. No thyromegaly or lymphadenopathy.  Cor: PMI displaced laterally. Regular rate & rhythm. +S3 Lungs: Mechanical breath sounds Abdomen: soft, NT, distended. No HSM. No bruits or masses. +BS  Extremities: warm  R and LLE trace edema. Ted hose on.  R groin HD catheter-small amount of bleeding. Left groin ecchymosis stable.  Neuro: Intubated, sedated.   Telemetry: Reviewed, Sinus Tach  100s   Labs: CBC  Recent Labs  05/01/15 0407 05/02/15 0400  WBC 13.7* 13.5*  HGB 10.5* 11.6*  HCT 31.7* 35.6*  MCV 90.3 91.0  PLT 120* 1027   Basic Metabolic Panel  Recent Labs  05/02/15 0400 05/02/15 1550 05/03/15 0450  NA 135  136 135 135  135  K 4.7  4.8 5.2* 5.3*  5.3*  CL 97*  97* 99* 97*  97*  CO2 24  25 22 24  24   GLUCOSE 137*  142* 166* 147*  146*  BUN 30*  30* 32* 40*  41*  CREATININE 2.26*  2.22* 2.27* 2.31*  2.31*  CALCIUM 8.0*  8.0* 8.2* 8.4*  8.4*  MG 2.7*  --  2.9*  PHOS 5.2* 5.8* 5.7*   Liver Function Tests  Recent Labs  05/02/15 0400 05/02/15 1550 05/03/15 0450  AST 94*  --  76*  ALT 185*  --  153*  ALKPHOS 144*  --  153*  BILITOT 13.8*  --  12.6*  PROT 6.4*  --  6.8  ALBUMIN 2.1*  2.1* 2.1* 2.1*  2.1*   No results for input(s): LIPASE, AMYLASE in the last 72 hours. Cardiac Enzymes No results for input(s): CKTOTAL, CKMB, CKMBINDEX, TROPONINI in the last 72 hours.  BNP: BNP (last 3 results)  Recent Labs  04/13/2015 2338  BNP 612.8*    ProBNP (last 3 results) No results for input(s): PROBNP in the last 8760 hours.   D-Dimer No results for input(s): DDIMER in the last 72 hours. Hemoglobin A1C No results for input(s): HGBA1C in the last 72 hours. Fasting Lipid Panel No results for input(s): CHOL, HDL, LDLCALC, TRIG, CHOLHDL, LDLDIRECT in the last 72 hours. Thyroid Function Tests No results for input(s): TSH, T4TOTAL, T3FREE, THYROIDAB in the last 72  hours.  Invalid input(s): FREET3  Other results:     Imaging/Studies:  Dg Chest Port 1 View  05/02/2015  CLINICAL DATA:  Hypoxia EXAM: PORTABLE CHEST 1 VIEW COMPARISON:  April 30, 2015 FINDINGS: Endotracheal tube tip is 5.0 cm above the carina. Swan-Ganz catheter tip remains in the right lower lobe pulmonary artery. Nasogastric tube tip and side port are in the stomach. There is no appreciable pneumothorax. There is patchy atelectatic change in both lower lobes with small right effusion. Small left effusion has resolved. No new opacity. There is cardiomegaly, stable. The pulmonary vascularity is within normal limits. No adenopathy. IMPRESSION: Tube and catheter positions as described without pneumothorax. Again, the Swan-Ganz catheter tip is somewhat peripheral in location, well within the right lower lobe pulmonary artery. Small right effusion with bibasilar atelectasis. No new opacity. No change in cardiac silhouette. Electronically Signed   By: Lowella Grip III M.D.   On: 05/02/2015 07:03   Dg Abd Portable 1v  05/02/2015  CLINICAL DATA:  Encounter for orogastric tube placement. EXAM: PORTABLE ABDOMEN - 1 VIEW COMPARISON:  04/27/2015 FINDINGS: Side-port of the enteric tube just beyond the gastroesophageal junction, side-port in the region of the body. Right presumed femoral catheter, tip to the right side of L5-S1. Impella device projects over the cardiac silhouette in the lower thorax. Gaseous distention of bowel loops appears similar to prior exam. This appears to be majority colonic distention. No evidence of free air on portable supine view. IMPRESSION: Tip of the enteric tube in the stomach, side-port just beyond the gastroesophageal junction. Gaseous distension of bowel loops, primarily colon, similar to prior exams. Electronically Signed   By: Jeb Levering M.D.   On: 05/02/2015 02:33    Latest Echo  Latest Cath   Medications:     Scheduled Medications: . antiseptic oral  rinse  7 mL Mouth Rinse 10 times per day  . chlorhexidine gluconate (SAGE KIT)  15 mL Mouth Rinse BID  . feeding supplement (PRO-STAT SUGAR FREE 64)  60 mL Per Tube TID  . feeding supplement (VITAL 1.5 CAL)  1,000 mL Per Tube Q24H  . insulin aspart  0-15 Units Subcutaneous 6 times per day  . nafcillin IV  2 g Intravenous 4 times per day  . pantoprazole sodium  40 mg Per Tube Q24H  . sodium chloride flush  10-40 mL Intracatheter Q12H  . sodium chloride flush  3 mL Intravenous Q12H  . sodium chloride flush  3 mL Intravenous Q12H    Infusions: . amiodarone 30 mg/hr (05/03/15 0148)  . fentaNYL infusion INTRAVENOUS 150 mcg/hr (05/03/15 0357)  . heparin 1,300 Units/hr (05/02/15 2235)  . milrinone  0.25 mcg/kg/min (05/03/15 0432)  . norepinephrine (LEVOPHED) Adult infusion 17 mcg/min (05/03/15 0500)  . dialysis replacement fluid (prismasate) 700 mL/hr at 05/03/15 0346  . dialysis replacement fluid (prismasate) 300 mL/hr at 05/03/15 2426  . dialysate (PRISMASATE) 1,500 mL/hr at 05/03/15 0346    PRN Medications: sodium chloride, sodium chloride, sodium chloride, acetaminophen, heparin, heparin, midazolam, ondansetron (ZOFRAN) IV, sodium chloride, sodium chloride flush, sodium chloride flush, sodium chloride flush   Assessment/Plan   Wesley Tyler is a 66 y.o. male sent to [facility] after PCP labs came back abnormal with mildly elevated troponin. Was found to be in florid HF and transferred to [facility] for further work up. He has no known medical history, as he has not seen an MD in > 40 years.  1. Acute systolic CHF/cardiogenic shock: Steadily worsening dyspnea over the last month, no clear trigger. Markedly worse 3/28, came to ER. EF 10% on bedside echo. Noted to be in atrial fibrillation and hypotensive, started initially on phenylephrine but transitioned over to dobutamine. No chest pain, slight troponin elevation is likely explained by cardiogenic shock/volume overload. Ddx for cardiomyopathy =  coronary disease (?chronic) versus myocarditis versus tachy-mediated with atrial fibrillation of uncertain duration. Required Impella placement with low cardiac output on 3/29 (CI 1.3 by thermodilution on dobutamine).  He decompensated and was intubated. Remains on norepinephrine and milrinone, off vasopressin. Impella removed 4/5. CVP 8 now with CVVH and weight down.  - CO-OX 62%. Continue milrinone 0.25 mcg/kg/min.  Wean norepinephrine as able. Add midodrine 5 mg tid to aide norepinephrine wean.  Can aim for SBP > 85.     - Continue UF via CVVH, CVP down to 8.  - If he recovers, will eventually need coronary angiography.  2. Acute hypoxic respiratory failure:  Remains intubated. Settings per CCM. Hopefully can wean soon with Impella out.  3. AKI: Suspect ATN, cardiorenal/related to low output HF and hypotension. Currently on CVVH. 4. Atrial fibrillation: On heparin and amiodarone gtt for rate control.  Uncertain duration of atrial fibrillation, cannot rule out tachy-mediated CMP. Now suspect atrial flutter, will get ECG to confirm.  Eventual TEE-guided DCCV.   5. Endo: Elevated TSH and mildly elevated free T4. ?Sick euthyroid. Will follow.  6. Elevated LFTs: Suspect shock liver.  LFTs rose prior to amiodarone initiation but LFTs coming down now.  7. Hemolysis: Bilirubin coming down.  LDH coming down. Check CBC today.  8. ID: Probable sepsis, fever 3/30, blood/sputm cultures sent. Blood cultures NGTD today. WBC coming down. Group C Strep in sputum cultures, now on nafcillin.  9. Hyponatremia- Na 135  10. Abdominal Distention- KUB now. No BM 4 days  The patient is critically ill with multiple organ systems failure and requires high complexity decision making for assessment and support, frequent evaluation and titration of therapies, application of advanced monitoring technologies and extensive interpretation of multiple databases.   Length of Stay: [place] NP-C  05/03/2015, 7:07  AM  Advanced Heart Failure Team Pager [phone] (M-F; 7a - 4p)  Please contact [facility] for night-coverage after hours (4p -7a ) and weekends on [email or web address]  Patient seen with NP, agree with the above note.  Impella and Swan out yesterday. Co-ox 62% with CVP 8 today.  CVVH continues.  Will work on weaning norepinephrine today, can add midodrine 5 mg tid to aide this.  ECG for rhythm, suspect atrial flutter.  Eventually would aim for cardioversion.  Vent weaning per CCM.  35 minutes critical care time.  Loralie Champagne 05/03/2015 7:36 AM

## 2015-05-03 NOTE — Progress Notes (Signed)
ANTICOAGULATION CONSULT NOTE - Follow Up Consult  Pharmacy Consult for heparin  Indication: afib  No Known Allergies  Patient Measurements: Height: 6' 4"  (193 cm) Weight: 188 lb 0.8 oz (85.3 kg) IBW/kg (Calculated) : 86.8   Vital Signs: Temp: 97.5 F (36.4 C) (04/06 0800) Temp Source: Oral (04/06 0800) BP: 107/63 mmHg (04/06 0927) Pulse Rate: 106 (04/06 0927)  Labs:  Recent Labs  05/01/15 0407  05/02/15 0400 05/02/15 1550 05/03/15 0450 05/03/15 0747  HGB 10.5*  --  11.6*  --   --  12.6*  HCT 31.7*  --  35.6*  --   --  38.2*  PLT 120*  --  136*  --   --  169  APTT 63*  --  71*  --  51*  --   LABPROT 15.5*  --   --   --   --   --   INR 1.22  --   --   --   --   --   HEPARINUNFRC  --   --   --   --  <0.10*  --   CREATININE 2.11*  < > 2.26*  2.22* 2.27* 2.31*  2.31*  --   < > = values in this interval not displayed.  Estimated Creatinine Clearance: 38.5 mL/min (by C-G formula based on Cr of 2.31).   Medications:  Scheduled:  . antiseptic oral rinse  7 mL Mouth Rinse 10 times per day  . bisacodyl  10 mg Rectal Once  . chlorhexidine gluconate (SAGE KIT)  15 mL Mouth Rinse BID  . insulin aspart  0-15 Units Subcutaneous 6 times per day  . midodrine  5 mg Oral 3 times per day  . nafcillin IV  2 g Intravenous 4 times per day  . pantoprazole (PROTONIX) IV  40 mg Intravenous Q24H  . sodium chloride flush  10-40 mL Intracatheter Q12H    Assessment: 65 yo male with cardiogenic shock s/p RHC. Impella placed until 4/5;  now removed. Previously with therapeutic ACTs with total heparin ~1300-1400 units/hr (purge solution + systemic heparin).   Pharmacy asked to resume systemic heparin, was restarted last night at 1030 PM.  Undetectable heparin level this AM, per RN no trouble with IV.  CBC stable.  Per RN some old bruising at Impella site, but looks stable.  Goal of Therapy:  Heparin level 0.3-0.7 Monitor platelets by anticoagulation protocol: Yes   Plan:  -Increase  IV heparin to 1450 units/hr -Recheck heparin level in 6 hrs. -Daily heparin level and CBC. -F/u plans for oral anticoagulation eventually.  Uvaldo Rising, BCPS  Clinical Pharmacist Pager [phone]  05/03/2015 11:12 AM

## 2015-05-03 NOTE — Progress Notes (Signed)
Wasted 150 cc of Fentanyl gtt with Dub AmisSarah Tonny Isensee RN

## 2015-05-04 ENCOUNTER — Inpatient Hospital Stay (HOSPITAL_COMMUNITY): Payer: Medicare Other

## 2015-05-04 DIAGNOSIS — I361 Nonrheumatic tricuspid (valve) insufficiency: Secondary | ICD-10-CM

## 2015-05-04 LAB — RENAL FUNCTION PANEL
Albumin: 2 g/dL — ABNORMAL LOW (ref 3.5–5.0)
Anion gap: 17 — ABNORMAL HIGH (ref 5–15)
BUN: 30 mg/dL — ABNORMAL HIGH (ref 6–20)
CALCIUM: 8 mg/dL — AB (ref 8.9–10.3)
CHLORIDE: 96 mmol/L — AB (ref 101–111)
CO2: 19 mmol/L — AB (ref 22–32)
CREATININE: 2.23 mg/dL — AB (ref 0.61–1.24)
GFR calc non Af Amer: 29 mL/min — ABNORMAL LOW (ref >60)
GFR, EST AFRICAN AMERICAN: 34 mL/min — AB (ref >60)
GLUCOSE: 116 mg/dL — AB (ref 65–99)
Phosphorus: 4.9 mg/dL — ABNORMAL HIGH (ref 2.5–4.6)
Potassium: 4.1 mmol/L (ref 3.5–5.1)
SODIUM: 132 mmol/L — AB (ref 135–145)

## 2015-05-04 LAB — ECHOCARDIOGRAM COMPLETE
HEIGHTINCHES: 76 in
WEIGHTICAEL: 2913.6 [oz_av]

## 2015-05-04 LAB — CARBOXYHEMOGLOBIN
CARBOXYHEMOGLOBIN: 1.5 % (ref 0.5–1.5)
METHEMOGLOBIN: 0.8 % (ref 0.0–1.5)
O2 SAT: 66.2 %
TOTAL HEMOGLOBIN: 12.7 g/dL — AB (ref 13.5–18.0)

## 2015-05-04 LAB — COMPREHENSIVE METABOLIC PANEL
ALT: 126 U/L — ABNORMAL HIGH (ref 17–63)
ANION GAP: 14 (ref 5–15)
AST: 79 U/L — AB (ref 15–41)
Albumin: 2 g/dL — ABNORMAL LOW (ref 3.5–5.0)
Alkaline Phosphatase: 138 U/L — ABNORMAL HIGH (ref 38–126)
BILIRUBIN TOTAL: 13.2 mg/dL — AB (ref 0.3–1.2)
BUN: 34 mg/dL — ABNORMAL HIGH (ref 6–20)
CHLORIDE: 97 mmol/L — AB (ref 101–111)
CO2: 22 mmol/L (ref 22–32)
Calcium: 8.1 mg/dL — ABNORMAL LOW (ref 8.9–10.3)
Creatinine, Ser: 2.53 mg/dL — ABNORMAL HIGH (ref 0.61–1.24)
GFR, EST AFRICAN AMERICAN: 29 mL/min — AB (ref >60)
GFR, EST NON AFRICAN AMERICAN: 25 mL/min — AB (ref >60)
Glucose, Bld: 143 mg/dL — ABNORMAL HIGH (ref 65–99)
POTASSIUM: 4.3 mmol/L (ref 3.5–5.1)
Sodium: 133 mmol/L — ABNORMAL LOW (ref 135–145)
TOTAL PROTEIN: 6.2 g/dL — AB (ref 6.5–8.1)

## 2015-05-04 LAB — GLUCOSE, CAPILLARY
GLUCOSE-CAPILLARY: 119 mg/dL — AB (ref 65–99)
GLUCOSE-CAPILLARY: 103 mg/dL — AB (ref 65–99)
GLUCOSE-CAPILLARY: 110 mg/dL — AB (ref 65–99)
Glucose-Capillary: 142 mg/dL — ABNORMAL HIGH (ref 65–99)
Glucose-Capillary: 213 mg/dL — ABNORMAL HIGH (ref 65–99)
Glucose-Capillary: 120 mg/dL — ABNORMAL HIGH (ref 65–99)

## 2015-05-04 LAB — PHOSPHORUS: PHOSPHORUS: 5.2 mg/dL — AB (ref 2.5–4.6)

## 2015-05-04 LAB — CBC
HEMATOCRIT: 36.2 % — AB (ref 39.0–52.0)
Hemoglobin: 12.1 g/dL — ABNORMAL LOW (ref 13.0–17.0)
MCH: 30.6 pg (ref 26.0–34.0)
MCHC: 33.4 g/dL (ref 30.0–36.0)
MCV: 91.6 fL (ref 78.0–100.0)
PLATELETS: 206 10*3/uL (ref 150–400)
RBC: 3.95 MIL/uL — ABNORMAL LOW (ref 4.22–5.81)
RDW: 18.2 % — AB (ref 11.5–15.5)
WBC: 15.7 10*3/uL — AB (ref 4.0–10.5)

## 2015-05-04 LAB — HEPARIN LEVEL (UNFRACTIONATED): HEPARIN UNFRACTIONATED: 0.44 [IU]/mL (ref 0.30–0.70)

## 2015-05-04 LAB — MAGNESIUM: MAGNESIUM: 3 mg/dL — AB (ref 1.7–2.4)

## 2015-05-04 LAB — APTT: APTT: 105 s — AB (ref 24–37)

## 2015-05-04 MED ORDER — PANTOPRAZOLE SODIUM 40 MG PO TBEC
40.0000 mg | DELAYED_RELEASE_TABLET | Freq: Every day | ORAL | Status: DC
  Administered 2015-05-04 – 2015-05-06 (×3): 40 mg via ORAL
  Filled 2015-05-04 (×3): qty 1

## 2015-05-04 MED ORDER — PERFLUTREN LIPID MICROSPHERE
1.0000 mL | INTRAVENOUS | Status: AC | PRN
  Administered 2015-05-04: 2 mL via INTRAVENOUS
  Filled 2015-05-04: qty 10

## 2015-05-04 MED ORDER — AMIODARONE HCL 200 MG PO TABS
200.0000 mg | ORAL_TABLET | Freq: Two times a day (BID) | ORAL | Status: DC
  Administered 2015-05-04 – 2015-05-06 (×6): 200 mg via ORAL
  Filled 2015-05-04 (×6): qty 1

## 2015-05-04 MED ORDER — ENSURE ENLIVE PO LIQD: 237.0000 mL | Freq: Two times a day (BID) | ORAL | Status: DC

## 2015-05-04 MED ORDER — MILRINONE IN DEXTROSE 20 MG/100ML IV SOLN
0.2500 ug/kg/min | INTRAVENOUS | Status: DC
  Administered 2015-05-04 – 2015-05-06 (×4): 0.25 ug/kg/min via INTRAVENOUS
  Filled 2015-05-04 (×4): qty 100

## 2015-05-04 MED ORDER — PERFLUTREN LIPID MICROSPHERE
INTRAVENOUS | Status: AC
  Filled 2015-05-04: qty 10

## 2015-05-04 MED ORDER — MIDODRINE HCL 5 MG PO TABS
10.0000 mg | ORAL_TABLET | Freq: Three times a day (TID) | ORAL | Status: DC
  Administered 2015-05-04 – 2015-05-06 (×8): 10 mg via ORAL
  Filled 2015-05-04 (×10): qty 2

## 2015-05-04 NOTE — Progress Notes (Signed)
Speech Language Pathology Treatment: Dysphagia  Patient Details Name: Wesley DuffelJoseph Levins MRN: [id number] DOB: 12/19/1950 Today's Date: 05/04/2015 Time: [id number] SLP Time Calculation (min) (ACUTE ONLY): 20 min  Assessment / Plan / Recommendation Clinical Impression  Patient seen to assess diet toleration and determine readiness for upgrade beyond current diet of Dys 3, Nectar thick liquids. Patient's voice remained clear throughout session. Patient appeared to tolerate thin liquids via successive cup sips and straw sips, and although patient was impulsive with intake of liquids, he only exhibited intermittent, mild delayed cough after thin liquid intake. SLP spoke with patient's RN who said that he had been "doing awesome" with his diet and she was confident that he would be ready to upgrade.    HPI HPI: 65 yo male presented with progressive dyspnea, edema, orthopnea. He was found to be in A fib and start on eliquis, beta blocker as outpt. He developed progressive altered mental status and came to ER. Found to have acute pulmonary edema from acute systolic CHF, AKI, CVVHD.  Intubated 3/29-05/03/15.        SLP Plan  Continue with current plan of care     Recommendations  Diet recommendations: Dysphagia 3 (mechanical soft);Thin liquid Liquids provided via: Cup;Straw Medication Administration: Whole meds with puree Supervision: Patient able to self feed;Intermittent supervision to cue for compensatory strategies Compensations: Minimize environmental distractions;Slow rate;Small sips/bites             Oral Care Recommendations: Oral care BID Plan: Continue with current plan of care     GO                Pablo Lawrencereston, Marlean Mortell Tarrell 05/04/2015, 3:29 PM  Angela NevinJohn T. Swathi Dauphin, MA, CCC-SLP 05/04/2015 3:33 PM

## 2015-05-04 NOTE — Progress Notes (Signed)
Echocardiogram 2D Echocardiogram with Definity has been performed.  Nolon RodBrown, Tony 05/04/2015, 3:37 PM

## 2015-05-04 NOTE — Progress Notes (Signed)
CVVHD management. Remains on levophed and milrinone.  Extubated.  Metabolically stable.  Will need temp/PC in neck at some point.

## 2015-05-04 NOTE — Progress Notes (Signed)
Nutrition Follow-up  INTERVENTION:   Ensure Enlive po BID, each supplement provides 350 kcal and 20 grams of protein  NUTRITION DIAGNOSIS:   Inadequate oral intake related to dysphagia as evidenced by meal completion < 50%. Ongoing.  GOAL:   Patient will meet greater than or equal to 90% of their needs Progressing.   MONITOR:   PO intake, Supplement acceptance, I & O's, Labs   ASSESSMENT:   65 yo male presented with progressive dyspnea, edema, orthopnea. He was found to be in A fib and start on eliquis, beta blocker as outpt. He developed progressive altered mental status and came to ER. Found to have acute pulmonary edema from acute systolic CHF, AKI.  4/5 IABP out 4/6 extubated CVVHD continues Pt receiving test Per RN pt is drinking well now that on thin liquids but only eating </= 50% of his meals.   Labs reviewed: sodium low 133, phosphorus elevated 5.2, magnesium elevated 3.0  Diet Order:  DIET DYS 3 Room service appropriate?: Yes; Fluid consistency:: Thin  Skin:  Reviewed, no issues  Last BM:  4/6  Height:   Ht Readings from Last 1 Encounters:  05/02/15 6\' 4"  (1.93 m)    Weight:   Wt Readings from Last 1 Encounters:  05/04/15 182 lb 1.6 oz (82.6 kg)    Ideal Body Weight:  91.81 kg  BMI:  Body mass index is 22.18 kg/(m^2).  Estimated Nutritional Needs:   Kcal:  2100-2300  Protein:  125-140 grams  Fluid:  >/= 2L  EDUCATION NEEDS:   No education needs identified at this time  Kendell BaneHeather Reinette Cuneo RD, LDN, CNSC [phone] Pager [phone] After Hours Pager

## 2015-05-04 NOTE — Progress Notes (Signed)
ANTICOAGULATION CONSULT NOTE - Follow Up Consult  Pharmacy Consult for Heparin  Indication: afib  No Known Allergies  Patient Measurements: Height: 6\' 4"  (193 cm) Weight: 182 lb 1.6 oz (82.6 kg) IBW/kg (Calculated) : 86.8   Vital Signs: Temp: 98.2 F (36.8 C) (04/07 0800) Temp Source: Axillary (04/07 0800) BP: 96/76 mmHg (04/07 0800)  Labs:  Recent Labs  05/02/15 0400  05/03/15 0450 05/03/15 0747 05/03/15 1500 05/03/15 1611 05/04/15 0358  HGB 11.6*  --   --  12.6*  --   --  12.1*  HCT 35.6*  --   --  38.2*  --   --  36.2*  PLT 136*  --   --  169  --   --  206  APTT 71*  --  51*  --   --   --  105*  HEPARINUNFRC  --   --  <0.10*  --  0.29*  --  0.44  CREATININE 2.26*  2.22*  < > 2.31*  2.31*  --   --  2.40* 2.53*  < > = values in this interval not displayed.  Estimated Creatinine Clearance: 34 mL/min (by C-G formula based on Cr of 2.53).  Medications: Heparin @ 1500 units/hr  Assessment: 64yom s/p cardiogenic shock and Impella (removed 4/5) continues on heparin for afib. Heparin level is therapeutic today at 0.44. CBC is stable. No bleeding reported.  Goal of Therapy:  Heparin level 0.3-0.7 Monitor platelets by anticoagulation protocol: Yes   Plan:  1) Continue heparin at 1500 units/hr 2) Daily heparin level and CBC 3) Will need oral AC eventually  Louie CasaJennifer Alisandra Son, PharmD, BCPS  05/04/2015 10:25 AM

## 2015-05-04 NOTE — Progress Notes (Addendum)
Advanced Heart Failure Rounding Note  PCP: Recently established with Tandy Gaw, PA-C - Family medicine (Apr 25, 2015) Primary Cardiologist: New  Subjective:    Admitted 3/28 with cardiogenic shock.  He became critically worse with C.I. of 0.84 despite Impella and Dobutamine 1 on 3/29.  Dobutamine switched to milrinone as he was also getting amiodarone.  De-satted into 50% with profound respiratory fatigue and was intubated.   He developed progressive AKI and was started on CVVH.   Impella and Swan removed 4/5.  Extubated 4/6. On room air. Started on midodrine. Remains milrinone 0.25 mcg and norepi 16 mcg. Passed swallow evaluation.     Co-ox 66%, CVP 4-5   TEE (3/29):  EF 5-10% diffuse hypokinesis, dilated RV with severely decreased systolic function, moderate AI, moderate MR, severe TR. Spontaneous left to right bubbles but unable to discover source (not ASD or VSD, ?pulmonary AVMs).     Objective:   Weight Range: 182 lb 1.6 oz (82.6 kg) Body mass index is 22.18 kg/(m^2).   Vital Signs:   Temp:  [97.5 F (36.4 C)-98 F (36.7 C)] 97.9 F (36.6 C) (04/07 0400) Pulse Rate:  [106-108] 106 (04/06 0927) Resp:  [10-21] 13 (04/07 0635) BP: (92-113)/(63-90) 92/71 mmHg (04/07 0000) SpO2:  [94 %-100 %] 100 % (04/07 1308) Arterial Line BP: (76-122)/(47-76) 78/50 mmHg (04/07 0635) FiO2 (%):  [40 %] 40 % (04/06 0906) Weight:  [182 lb 1.6 oz (82.6 kg)] 182 lb 1.6 oz (82.6 kg) (04/07 0500) Last BM Date: 04/29/15  Weight change: Filed Weights   05/02/15 0400 05/03/15 0500 05/04/15 0500  Weight: 195 lb 12.3 oz (88.8 kg) 188 lb 0.8 oz (85.3 kg) 182 lb 1.6 oz (82.6 kg)    Intake/Output:   Intake/Output Summary (Last 24 hours) at 05/04/15 0704 Last data filed at 05/04/15 0700  Gross per 24 hour  Intake 2184.32 ml  Output   3994 ml  Net -1809.68 ml     Physical Exam: CVP 4-5  General: NAD   HEENT: normal Neck: supple. Carotids 2+ bilat; no bruits. No thyromegaly or  lymphadenopathy.  Cor: PMI displaced laterally. Regular rate & rhythm. +S3 Lungs: Clear on room air.  Abdomen: soft, NT, distended. No HSM. No bruits or masses. +BS  Extremities: warm  R and LLE no edema. Ted hose on.  R groin HD catheter-small amount of bleeding. Left groin ecchymosis stable.  Neuro: Alert and Oriented x3. Follows commands.    Telemetry: Reviewed, A flutter 100s   Labs: CBC  Recent Labs  05/03/15 0747 05/04/15 0358  WBC 14.8* 15.7*  HGB 12.6* 12.1*  HCT 38.2* 36.2*  MCV 94.1 91.6  PLT 169 206   Basic Metabolic Panel  Recent Labs  05/03/15 0450 05/03/15 1611 05/04/15 0358  NA 135  135 134* 133*  K 5.3*  5.3* 4.6 4.3  CL 97*  97* 100* 97*  CO2 24  24 21* 22  GLUCOSE 147*  146* 186* 143*  BUN 40*  41* 38* 34*  CREATININE 2.31*  2.31* 2.40* 2.53*  CALCIUM 8.4*  8.4* 8.1* 8.1*  MG 2.9*  --  3.0*  PHOS 5.7* 4.9* 5.2*   Liver Function Tests  Recent Labs  05/03/15 0450 05/03/15 1611 05/04/15 0358  AST 76*  --  79*  ALT 153*  --  126*  ALKPHOS 153*  --  138*  BILITOT 12.6*  --  13.2*  PROT 6.8  --  6.2*  ALBUMIN 2.1*  2.1* 1.9* 2.0*  No results for input(s): LIPASE, AMYLASE in the last 72 hours. Cardiac Enzymes No results for input(s): CKTOTAL, CKMB, CKMBINDEX, TROPONINI in the last 72 hours.  BNP: BNP (last 3 results)  Recent Labs  11/28/2015 2338  BNP 612.8*    ProBNP (last 3 results) No results for input(s): PROBNP in the last 8760 hours.   D-Dimer No results for input(s): DDIMER in the last 72 hours. Hemoglobin A1C No results for input(s): HGBA1C in the last 72 hours. Fasting Lipid Panel No results for input(s): CHOL, HDL, LDLCALC, TRIG, CHOLHDL, LDLDIRECT in the last 72 hours. Thyroid Function Tests No results for input(s): TSH, T4TOTAL, T3FREE, THYROIDAB in the last 72 hours.  Invalid input(s): FREET3  Other results:     Imaging/Studies:  Dg Chest Port 1 View  05/03/2015  CLINICAL DATA:  Respiratory  failure. EXAM: PORTABLE CHEST 1 VIEW COMPARISON:  05/02/2015. FINDINGS: Endotracheal tube, NG tube, right IJ sheath in stable position. Swan-Ganz catheter has been removed. Cardiomegaly with pulmonary vascular prominence and bilateral pulmonary infiltrates consistent with pulmonary edema. Similar finding noted on prior exam. Small right pleural effusion. IMPRESSION: 1. Interim removal Swan-Ganz catheter. Remaining lines and tubes in stable position. 2. Persistent mild cardiomegaly with bilateral mild basilar pulmonary infiltrates and small right pleural effusions suggesting congestive heart failure. No interim change. Electronically Signed   By: Maisie Fushomas  Register   On: 05/03/2015 07:14   Dg Abd Portable 1v  05/03/2015  CLINICAL DATA:  Abdominal distention EXAM: PORTABLE ABDOMEN - 1 VIEW COMPARISON:  Yesterday FINDINGS: Nasogastric tube tip overlapping the stomach. Right femoral sheath with tip in stable position near the right common femoral vein. Persistent prominent colonic gas without obstructive pattern. IMPRESSION: 1. Nasogastric tube tip overlaps the stomach. 2. Unchanged prominent colonic gas without obstructive pattern. Electronically Signed   By: Marnee SpringJonathon  Watts M.D.   On: 05/03/2015 08:11    Latest Echo  Latest Cath   Medications:     Scheduled Medications: . antiseptic oral rinse  7 mL Mouth Rinse q12n4p  . chlorhexidine  15 mL Mouth Rinse BID  . insulin aspart  0-15 Units Subcutaneous 6 times per day  . midodrine  5 mg Oral 3 times per day  . nafcillin IV  2 g Intravenous 4 times per day  . pantoprazole (PROTONIX) IV  40 mg Intravenous Q24H  . sodium chloride flush  10-40 mL Intracatheter Q12H    Infusions: . amiodarone 30 mg/hr (05/03/15 2357)  . heparin 1,500 Units/hr (05/03/15 1642)  . milrinone 0.25 mcg/kg/min (05/04/15 0627)  . norepinephrine (LEVOPHED) Adult infusion 16 mcg/min (05/04/15 [id number])  . dialysis replacement fluid (prismasate) 700 mL/hr at 05/04/15 0209  .  dialysis replacement fluid (prismasate) 300 mL/hr at 05/03/15 2357  . dialysate (PRISMASATE) 1,500 mL/hr at 05/04/15 0033    PRN Medications: sodium chloride, acetaminophen, fentaNYL (SUBLIMAZE) injection, heparin, heparin, ondansetron (ZOFRAN) IV, RESOURCE THICKENUP CLEAR, sodium chloride, sodium chloride flush   Assessment/Plan   Wesley Tyler is a 65 y.o. [facility]CP labs came back abnormal with mildly elevated troponin. Was found to be in florid HF and [facility]. He has no known medical history, as he has not seen an MD in > 40 years.  1. Acute systolic CHF/cardiogenic shock: Steadily worsening dyspnea over the last month, no clear trigger. Markedly worse 3/28, came to ER. EF 10% on bedside echo. Noted to be in atrial fibrillation and hypotensive, started initially on phenylephrine but transitioned over  to dobutamine. No chest pain, slight troponin elevation is likely explained by cardiogenic shock/volume overload. Ddx for cardiomyopathy = coronary disease (?chronic) versus myocarditis versus tachy-mediated with atrial fibrillation of uncertain duration. Required Impella placement with low cardiac output on 3/29 (CI 1.3 by thermodilution on dobutamine).  He decompensated and was intubated. Remains on norepinephrine and milrinone, off vasopressin. Impella removed 4/5. CVP 4-5 now with CVVH and weight down.  - CO-OX 68%. Continue milrinone 0.25 mcg/kg/min.  Wean norepinephrine as able. Increase midodrine 10 mg tid to aide norepinephrine wean.  Can aim for SBP > 85.     - Continue UF via CVVH, CVP down to 4-5  - If he recovers, will eventually need coronary angiography.  2. Acute hypoxic respiratory failure:  Now extubated.  3. AKI: Suspect ATN, cardiorenal/related to low output HF and hypotension. Currently on CVVH. 4. Atrial fibrillation: On heparin and amiodarone gtt for rate control.  Uncertain duration of atrial fibrillation, cannot rule  out tachy-mediated CMP. In A flutter. Eventual TEE-guided DCCV.  Stop amio drip and start amio 200 mg twice a day. 5. Endo: Elevated TSH and mildly elevated free T4. ?Sick euthyroid. Will follow.  6. Elevated LFTs: Suspect shock liver.  LFTs rose prior to amiodarone initiation but LFTs coming down now.  7. Hemolysis: Bilirubin coming down.  LDH coming down. Check CBC today.  8. ID: Probable sepsis, fever 3/30, blood/sputm cultures sent. Blood cultures NGTD today. WBC coming down. Group C Strep in sputum cultures, now on nafcillin.  9. Hyponatremia- Na 133  10. Abdominal Distention- KUB ok.   The patient is critically ill with multiple organ systems failure and requires high complexity decision making for assessment and support, frequent evaluation and titration of therapies, application of advanced monitoring technologies and extensive interpretation of multiple databases.   Length of Stay: 9  Amy Clegg NP-C  05/04/2015, 7:04 AM  Advanced Heart Failure Team Pager [phone] (M-F; 7a - 4p)  Please contact [facility] for night-coverage after hours (4p -7a ) and weekends on [email or web address]  Patient seen with NP, agree with the above note.  Extubated yesterday, awake and alert.  Still on norepinephrine, difficult to wean.  Good Co-ox and CVP.  Plan to increase midodrine to try to wean, go to 10 mg tid today.  Can wean for SBP >/= 85. Will need HD catheter moved to neck for more mobility, patient wants to hold off on this until tomorrow.  He remains in atrial fibrillation on amiodarone and heparin gtt.  Will need eventual DCCV, but would like to get off norepinephrine first.   35 minutes critical care time.   Marca Ancona 05/04/2015 2:43 PM

## 2015-05-04 NOTE — Progress Notes (Addendum)
PULMONARY / CRITICAL CARE MEDICINE   Name: Wesley DuffelJoseph Tyler MRN: [id number] DOB: 09/04/1950    ADMISSION DATE:  25-Aug-2015 CONSULTATION DATE:  04/01/2015  REFERRING MD:  EDP  CHIEF COMPLAINT:  SOB  SUBJECTIVE:  Doesn't remember what happened over last few days.  Denies chest pain.  Had several BM's since yesterday.  VITAL SIGNS: BP 96/76 mmHg  Pulse 106  Temp(Src) 98.2 F (36.8 C) (Axillary)  Resp 18  Ht 6\' 4"  (1.93 m)  Wt 182 lb 1.6 oz (82.6 kg)  BMI 22.18 kg/m2  SpO2 100%  HEMODYNAMICS: CVP:  [4 mmHg-8 mmHg] 7 mmHg  INTAKE / OUTPUT: I/O last 3 completed shifts: In: 3949 [P.O.:120; I.V.:2379; NG/GT:850; IV Piggyback:600] Out: 6710 [Other:6710]  PHYSICAL EXAMINATION: General: alert Neuro: moves all extremities HEENT: no stridor Cardiac: regular, 2/6 murmur Chest: no wheeze Abd: soft, non tender Ext: no edema Skin: no rashes  LABS:  BMET  Recent Labs Lab 05/03/15 0450 05/03/15 1611 05/04/15 0358  NA 135  135 134* 133*  K 5.3*  5.3* 4.6 4.3  CL 97*  97* 100* 97*  CO2 24  24 21* 22  BUN 40*  41* 38* 34*  CREATININE 2.31*  2.31* 2.40* 2.53*  GLUCOSE 147*  146* 186* 143*    Electrolytes  Recent Labs Lab 05/02/15 0400  05/03/15 0450 05/03/15 1611 05/04/15 0358  CALCIUM 8.0*  8.0*  < > 8.4*  8.4* 8.1* 8.1*  MG 2.7*  --  2.9*  --  3.0*  PHOS 5.2*  < > 5.7* 4.9* 5.2*  < > = values in this interval not displayed.  CBC  Recent Labs Lab 05/02/15 0400 05/03/15 0747 05/04/15 0358  WBC 13.5* 14.8* 15.7*  HGB 11.6* 12.6* 12.1*  HCT 35.6* 38.2* 36.2*  PLT 136* 169 206    Coag's  Recent Labs Lab 05/01/15 0407 05/02/15 0400 05/03/15 0450 05/04/15 0358  APTT 63* 71* 51* 105*  INR 1.22  --   --   --     Sepsis Markers No results for input(s): LATICACIDVEN, PROCALCITON, O2SATVEN in the last 168 hours.  ABG  Recent Labs Lab 04/29/15 0406 04/29/15 0534 04/30/15 0336  PHART 7.358 7.422 7.433  PCO2ART 41.5 38.9 35.5  PO2ART 158*  149.0* 115*    Liver Enzymes  Recent Labs Lab 05/02/15 0400  05/03/15 0450 05/03/15 1611 05/04/15 0358  AST 94*  --  76*  --  79*  ALT 185*  --  153*  --  126*  ALKPHOS 144*  --  153*  --  138*  BILITOT 13.8*  --  12.6*  --  13.2*  ALBUMIN 2.1*  2.1*  < > 2.1*  2.1* 1.9* 2.0*  < > = values in this interval not displayed.   Glucose  Recent Labs Lab 05/03/15 1117 05/03/15 1558 05/03/15 1930 05/03/15 2317 05/04/15 0356 05/04/15 0726  GLUCAP 141* 168* 106* 114* 119* 142*    Imaging No results found.   STUDIES:  3/29 Echo >> EF 20%, severe MR 3/29 RHC >> RA 10, RV 37/10, PA 04/03/14/26, PAOP 19, LV 92/71, CI 1.8 3/30 Renal u/s >> b/ renal cysts  CULTURES: 3/30 Blood >> negative 3/30 Sputum >> Group C Strep 3/30 Urine >> negative  ANTIBIOTICS: 3/30 Vancomycin >> 4/03 3/30 Zosyn >> 4/03 4/03 Nafcillin >>   SIGNIFICANT EVENTS: 3/29 Admit 3/30 cvvh started, IABP  4/05 PA catheter, IABP out  LINES/TUBES: 3/29 ETT >> 4/06 3/29 Rt radial aline >>  3/29 PA catheter >>  4/05 3/30 Rt femoral HD cath >>    DISCUSSION: 65 yo male presented with progressive dyspnea, edema, orthopnea.  He was found to be in A fib and start on eliquis, beta blocker as outpt.  He developed progressive altered mental status and came to ER.  Found to have acute pulmonary edema from acute systolic CHF, AKI.  ASSESSMENT / PLAN:  PULMONARY A: Acute hypoxic respiratory failure 2nd to acute pulmonary edema and CAP. Former smoker. P:   Bronchial hygiene F/u CXR as needed  CARDIOVASCULAR A: Acute systolic CHF. Cardiogenic shock. A fib with RVR. P:  Pressors, inotrope, amiodarone, heparin per cardiology  RENAL A:   AKI 2nd to ATN. P:   CVVH per renal   GASTROINTESTINAL A:   Elevated LFT's 2nd to shock >> improved. Nutrition. Abdominal distention noted 4/06 >> resolved 4/07. Dysphagia. P:   D3 diet >> f/u with speech therapy Protonix for SUP F/u LFT's  intermittently  HEMATOLOGIC A:   Anemia, thrombocytopenia. P:  F/u CBC  INFECTIOUS A:   Group C Streptococcal pneumonia. P:   Day 9/10 of abx >> changed to nafcillin 4/03  ENDOCRINE A:   Hyperglycemia - no hx DM >> HbA1C from 3/30 was 6.6. Hypothyroidism >> TSH from 3/28 was 6. P:   SSI  NEUROLOGIC A:   Acute metabolic encephalopathy >> resolved. P:   Monitor mental status  D/w cardiology >> will transfer to heart failure team service, and PCCM sign off.    Coralyn Helling, MD [facility] Pulmonary/Critical Care 05/04/2015, 9:48 AM Pager:  [phone] After 3pm call: [phone]

## 2015-05-05 LAB — RENAL FUNCTION PANEL
ALBUMIN: 1.9 g/dL — AB (ref 3.5–5.0)
ALBUMIN: 2.1 g/dL — AB (ref 3.5–5.0)
ANION GAP: 14 (ref 5–15)
Anion gap: 17 — ABNORMAL HIGH (ref 5–15)
BUN: 26 mg/dL — ABNORMAL HIGH (ref 6–20)
BUN: 27 mg/dL — AB (ref 6–20)
CALCIUM: 8.1 mg/dL — AB (ref 8.9–10.3)
CO2: 22 mmol/L (ref 22–32)
CO2: 23 mmol/L (ref 22–32)
CREATININE: 2.25 mg/dL — AB (ref 0.61–1.24)
Calcium: 8.6 mg/dL — ABNORMAL LOW (ref 8.9–10.3)
Chloride: 96 mmol/L — ABNORMAL LOW (ref 101–111)
Chloride: 96 mmol/L — ABNORMAL LOW (ref 101–111)
Creatinine, Ser: 2.09 mg/dL — ABNORMAL HIGH (ref 0.61–1.24)
GFR calc Af Amer: 33 mL/min — ABNORMAL LOW (ref >60)
GFR, EST AFRICAN AMERICAN: 37 mL/min — AB (ref >60)
GFR, EST NON AFRICAN AMERICAN: 32 mL/min — AB (ref >60)
GFR, EST NON AFRICAN AMERICAN: 29 mL/min — AB (ref >60)
Glucose, Bld: 138 mg/dL — ABNORMAL HIGH (ref 65–99)
Glucose, Bld: 124 mg/dL — ABNORMAL HIGH (ref 65–99)
PHOSPHORUS: 4.5 mg/dL (ref 2.5–4.6)
PHOSPHORUS: 6.8 mg/dL — AB (ref 2.5–4.6)
POTASSIUM: 4 mmol/L (ref 3.5–5.1)
Potassium: 3.5 mmol/L (ref 3.5–5.1)
SODIUM: 132 mmol/L — AB (ref 135–145)
Sodium: 136 mmol/L (ref 135–145)

## 2015-05-05 LAB — BILIRUBIN, FRACTIONATED(TOT/DIR/INDIR)
Bilirubin, Direct: 6.2 mg/dL — ABNORMAL HIGH (ref 0.1–0.5)
Indirect Bilirubin: 7.3 mg/dL — ABNORMAL HIGH (ref 0.3–0.9)
Total Bilirubin: 13.5 mg/dL — ABNORMAL HIGH (ref 0.3–1.2)

## 2015-05-05 LAB — CARBOXYHEMOGLOBIN
CARBOXYHEMOGLOBIN: 2 % — AB (ref 0.5–1.5)
METHEMOGLOBIN: 0.7 % (ref 0.0–1.5)
O2 Saturation: 67.1 %
TOTAL HEMOGLOBIN: 14 g/dL (ref 13.5–18.0)

## 2015-05-05 LAB — CBC
HEMATOCRIT: 37.6 % — AB (ref 39.0–52.0)
HEMOGLOBIN: 12.6 g/dL — AB (ref 13.0–17.0)
MCH: 30.6 pg (ref 26.0–34.0)
MCHC: 33.5 g/dL (ref 30.0–36.0)
MCV: 91.3 fL (ref 78.0–100.0)
Platelets: 271 10*3/uL (ref 150–400)
RBC: 4.12 MIL/uL — AB (ref 4.22–5.81)
RDW: 19 % — ABNORMAL HIGH (ref 11.5–15.5)
WBC: 13.9 10*3/uL — AB (ref 4.0–10.5)

## 2015-05-05 LAB — GLUCOSE, CAPILLARY
GLUCOSE-CAPILLARY: 132 mg/dL — AB (ref 65–99)
GLUCOSE-CAPILLARY: 84 mg/dL (ref 65–99)
GLUCOSE-CAPILLARY: 134 mg/dL — AB (ref 65–99)
Glucose-Capillary: 112 mg/dL — ABNORMAL HIGH (ref 65–99)
Glucose-Capillary: 162 mg/dL — ABNORMAL HIGH (ref 65–99)
Glucose-Capillary: 151 mg/dL — ABNORMAL HIGH (ref 65–99)

## 2015-05-05 LAB — HEPARIN LEVEL (UNFRACTIONATED)
Heparin Unfractionated: <0.1 IU/mL — ABNORMAL LOW (ref 0.30–0.70)
Heparin Unfractionated: 0.44 IU/mL (ref 0.30–0.70)

## 2015-05-05 LAB — MAGNESIUM: MAGNESIUM: 2.9 mg/dL — AB (ref 1.7–2.4)

## 2015-05-05 MED ORDER — NEPRO/CARBSTEADY PO LIQD
237.0000 mL | Freq: Two times a day (BID) | ORAL | Status: DC
  Administered 2015-05-05 – 2015-05-06 (×3): 237 mL via ORAL
  Filled 2015-05-05 (×7): qty 237

## 2015-05-05 NOTE — Progress Notes (Addendum)
Advanced Heart Failure Rounding Note  PCP: Recently established with Tandy Gaw, PA-C - Family medicine (04/27/2015) Primary Cardiologist: New  Subjective:    Admitted 3/28 with cardiogenic shock.  He became critically worse with C.I. of 0.84 despite Impella and Dobutamine 1 on 3/29.  Dobutamine switched to milrinone as he was also getting amiodarone.  De-satted into 50% with profound respiratory fatigue and was intubated.   He developed progressive AKI and was started on CVVH.   Impella and Swan removed 4/5.  Extubated 4/6. On room air. Now on midodrine 10 TID. Remains on milrinone 0.25 mcg and norepi 16 mcg. Passed swallow evaluation. Made 25 cc urine yesterday.  Co-ox 67%, CVP 4-5. Denies SOB.    TEE (3/29):  EF 5-10% diffuse hypokinesis, dilated RV with severely decreased systolic function, moderate AI, moderate MR, severe TR. Spontaneous left to right bubbles but unable to discover source (not ASD or VSD, ?pulmonary AVMs).   Echo 4/7: LV 10% RV moderately depressed (reviewed personally)    Objective:   Weight Range: 81.5 kg (179 lb 10.8 oz) Body mass index is 21.88 kg/(m^2).   Vital Signs:   Temp:  [97.5 F (36.4 C)-98.3 F (36.8 C)] 97.6 F (36.4 C) (04/08 1200) Resp:  [11-28] 22 (04/08 1300) BP: (99-104)/(74-83) 104/77 mmHg (04/08 0800) SpO2:  [90 %-100 %] 90 % (04/08 1300) Arterial Line BP: (83-109)/(51-68) 89/60 mmHg (04/08 1300) Weight:  [81.5 kg (179 lb 10.8 oz)] 81.5 kg (179 lb 10.8 oz) (04/08 0500) Last BM Date: 05/04/15  Weight change: Filed Weights   05/03/15 0500 05/04/15 0500 05/05/15 0500  Weight: 85.3 kg (188 lb 0.8 oz) 82.6 kg (182 lb 1.6 oz) 81.5 kg (179 lb 10.8 oz)    Intake/Output:   Intake/Output Summary (Last 24 hours) at 05/05/15 1315 Last data filed at 05/05/15 1300  Gross per 24 hour  Intake 2548.36 ml  Output   4884 ml  Net -2335.64 ml     Physical Exam: CVP 4-5  General: NAD   HEENT: normal Neck: supple. Carotids 2+  bilat; no bruits. No thyromegaly or lymphadenopathy.  Cor: PMI displaced laterally. Regular rate & rhythm. +S3 Lungs: Clear on room air.  Abdomen: soft, NT, distended. No HSM. No bruits or masses. +BS  Extremities: warm  R and LLE no edema. Ted hose on.  R groin HD catheter-small amount of bleeding. Left groin ecchymosis stable.  Neuro: Alert and Oriented x3. Follows commands.    Telemetry: Reviewed, A flutter 100s   Labs: CBC  Recent Labs  05/04/15 0358 05/05/15 0427  WBC 15.7* 13.9*  HGB 12.1* 12.6*  HCT 36.2* 37.6*  MCV 91.6 91.3  PLT 206 271   Basic Metabolic Panel  Recent Labs  05/04/15 0358 05/04/15 1653 05/05/15 0427  NA 133* 132* 132*  K 4.3 4.1 4.0  CL 97* 96* 96*  CO2 22 19* 22  GLUCOSE 143* 116* 138*  BUN 34* 30* 26*  CREATININE 2.53* 2.23* 2.09*  CALCIUM 8.1* 8.0* 8.1*  MG 3.0*  --  2.9*  PHOS 5.2* 4.9* 4.5   Liver Function Tests  Recent Labs  05/03/15 0450  05/04/15 0358 05/04/15 1653 05/05/15 0427  AST 76*  --  79*  --   --   ALT 153*  --  126*  --   --   ALKPHOS 153*  --  138*  --   --   BILITOT 12.6*  --  13.2*  --   --   PROT  6.8  --  6.2*  --   --   ALBUMIN 2.1*  2.1*  < > 2.0* 2.0* 1.9*  < > = values in this interval not displayed. No results for input(s): LIPASE, AMYLASE in the last 72 hours. Cardiac Enzymes No results for input(s): CKTOTAL, CKMB, CKMBINDEX, TROPONINI in the last 72 hours.  BNP: BNP (last 3 results)  Recent Labs  04/02/2015 2338  BNP 612.8*    ProBNP (last 3 results) No results for input(s): PROBNP in the last 8760 hours.   D-Dimer No results for input(s): DDIMER in the last 72 hours. Hemoglobin A1C No results for input(s): HGBA1C in the last 72 hours. Fasting Lipid Panel No results for input(s): CHOL, HDL, LDLCALC, TRIG, CHOLHDL, LDLDIRECT in the last 72 hours. Thyroid Function Tests No results for input(s): TSH, T4TOTAL, T3FREE, THYROIDAB in the last 72 hours.  Invalid input(s): FREET3  Other  results:     Imaging/Studies:  No results found.  Latest Echo  Latest Cath   Medications:     Scheduled Medications: . amiodarone  200 mg Oral BID  . antiseptic oral rinse  7 mL Mouth Rinse q12n4p  . chlorhexidine  15 mL Mouth Rinse BID  . feeding supplement (NEPRO CARB STEADY)  237 mL Oral BID BM  . insulin aspart  0-15 Units Subcutaneous 6 times per day  . midodrine  10 mg Oral 3 times per day  . nafcillin IV  2 g Intravenous 4 times per day  . pantoprazole  40 mg Oral Daily  . sodium chloride flush  10-40 mL Intracatheter Q12H    Infusions: . heparin 1,650 Units/hr (05/05/15 1300)  . milrinone 0.25 mcg/kg/min (05/05/15 1300)  . norepinephrine (LEVOPHED) Adult infusion 12.053 mcg/min (05/05/15 1300)  . dialysis replacement fluid (prismasate) 700 mL/hr at 05/05/15 0818  . dialysis replacement fluid (prismasate) 300 mL/hr at 05/05/15 1106  . dialysate (PRISMASATE) 1,500 mL/hr at 05/05/15 0813    PRN Medications: sodium chloride, acetaminophen, fentaNYL (SUBLIMAZE) injection, heparin, heparin, ondansetron (ZOFRAN) IV, RESOURCE THICKENUP CLEAR, sodium chloride, sodium chloride flush   Assessment/Plan   Pamala DuffelJoseph Cortner is a 65 y.[facility]. [facility] labs came back abnormal with mildly elevated troponin. Was found to be in florid HF and [facility]. He has no known medical history, as he has not seen an MD in > 40 years.  1. Acute systolic CHF/cardiogenic shock: Steadily worsening dyspnea over the last month, no clear trigger. Markedly worse 3/28, came to ER. EF 10% on bedside echo. Noted to be in atrial fibrillation and hypotensive, started initially on phenylephrine but transitioned over to dobutamine. No chest pain, slight troponin elevation is likely explained by cardiogenic shock/volume overload. Ddx for cardiomyopathy = coronary disease (?chronic) versus myocarditis versus tachy-mediated with atrial fibrillation of uncertain  duration. Required Impella placement with low cardiac output on 3/29 (CI 1.3 by thermodilution on dobutamine).  He decompensated and was intubated. Remains on norepinephrine and milrinone, off vasopressin. Impella removed 4/5. CVP 4-5 now with CVVH and weight down.  - CO-OX 67%. Continue milrinone 0.25 mcg/kg/min.  Wean norepinephrine as able. Continue midodrine 10 mg tid to aid norepinephrine wean.  Can aim for SBP > 85.     - Continue UF via CVVH, CVP down to 4-5. Pending PC placement - If he recovers, will eventually need coronary angiography.  2. Acute hypoxic respiratory failure:  Now extubated.  3. AKI: Suspect ATN, cardiorenal/related to low output HF  and hypotension. Currently on CVVH. 4. Atrial fibrillation: On heparin and amiodarone gtt for rate control.  Uncertain duration of atrial fibrillation, cannot rule out tachy-mediated CMP. In A flutter. Eventual TEE-guided DCCV. On amio 200 mg twice a day. 5. Endo: Elevated TSH and mildly elevated free T4. ?Sick euthyroid. Will follow.  6. Elevated LFTs: Suspect shock liver.  LFTs rose prior to amiodarone initiation but LFTs coming down now.  7. Hyperbilirubinemia - Bili up to 13. Will fractionate. Check RUQ u/s. Suspect RV failure.   8. ID: Probable sepsis, fever 3/30,. Blood cultures negative.. Group C Strep in sputum cultures, now on nafcillin.  9. Hyponatremia- Na 132  10. Abdominal Distention- KUB ok.   He has persistent biventricular failure with concomitant renal and liver failure. There has been no ventricular recovery on echo. I think prognosis is extremely poor unless he has some cardiac or renal recovery. Will plan TEE/DC-CV early this week to see if restoration of NSR might aid in ventricular recovery. I spoke with Dr. Briant Cedar and he does not think he will tolerate PC well. Will ask CCM to place LIJ trialysis catheter so we can ambulate him.  Check RUQ u/s. Plan TEE/DC-CV this week.    The patient is critically ill with  multiple organ systems failure and requires high complexity decision making for assessment and support, frequent evaluation and titration of therapies, application of advanced monitoring technologies and extensive interpretation of multiple databases.   Critical Care Time devoted to patient care services described in this note is 45 Minutes.    Length of Stay: 10  Arvilla Meres MD 05/05/2015, 1:15 PM  Advanced Heart Failure Team Pager [phone] (M-F; 7a - 4p)  Please contact [facility] for night-coverage after hours (4p -7a ) and weekends on [email or web address]

## 2015-05-05 NOTE — Progress Notes (Signed)
ANTICOAGULATION CONSULT NOTE - Follow Up Consult  Pharmacy Consult for Heparin  Indication: afib  No Known Allergies  Patient Measurements: Height: 6\' 4"  (193 cm) Weight: 179 lb 10.8 oz (81.5 kg) IBW/kg (Calculated) : 86.8   Vital Signs: Temp: 97.6 F (36.4 C) (04/08 1200) Temp Source: Axillary (04/08 1200) BP: 104/77 mmHg (04/08 0800)  Labs:  Recent Labs  05/03/15 0450  05/03/15 0747  05/04/15 0358 05/04/15 1653 05/05/15 0427 05/05/15 1344  HGB  --   < > 12.6*  --  12.1*  --  12.6*  --   HCT  --   --  38.2*  --  36.2*  --  37.6*  --   PLT  --   --  169  --  206  --  271  --   APTT 51*  --   --   --  105*  --   --   --   HEPARINUNFRC <0.10*  --   --   < > 0.44  --  <0.10* 0.44  CREATININE 2.31*  2.31*  --   --   < > 2.53* 2.23* 2.09*  --   < > = values in this interval not displayed.  Estimated Creatinine Clearance: 40.6 mL/min (by C-G formula based on Cr of 2.09).   Assessment: 64yom s/p cardiogenic shock and Impella (removed 4/5) continues on heparin for afib. Heparin level this a.m. Undetectable.  Heparin drip was increased to 1650 uts/hr HL now 0.44. RN reports no issues with line or bleeding.  CBC stable.  Goal of Therapy:  Heparin level 0.3-0.7 Monitor platelets by anticoagulation protocol: Yes   Plan:  Continue heparin to 1650 units/hr daily CBC, HL,   Leota SauersLisa Gad Aymond Pharm.D. CPP, BCPS Clinical Pharmacist [phone] 05/05/2015 3:12 PM  '

## 2015-05-05 NOTE — Progress Notes (Signed)
ANTICOAGULATION CONSULT NOTE - Follow Up Consult  Pharmacy Consult for Heparin  Indication: afib  No Known Allergies  Patient Measurements: Height: 6\' 4"  (193 cm) Weight: 182 lb 1.6 oz (82.6 kg) IBW/kg (Calculated) : 86.8   Vital Signs: Temp: 98.3 F (36.8 C) (04/08 0400) Temp Source: Axillary (04/08 0400) BP: 99/74 mmHg (04/08 0000)  Labs:  Recent Labs  05/03/15 0450  05/03/15 0747 05/03/15 1500  05/04/15 0358 05/04/15 1653 05/05/15 0427  HGB  --   < > 12.6*  --   --  12.1*  --  12.6*  HCT  --   --  38.2*  --   --  36.2*  --  37.6*  PLT  --   --  169  --   --  206  --  271  APTT 51*  --   --   --   --  105*  --   --   HEPARINUNFRC <0.10*  --   --  0.29*  --  0.44  --  <0.10*  CREATININE 2.31*  2.31*  --   --   --   < > 2.53* 2.23* 2.09*  < > = values in this interval not displayed.  Estimated Creatinine Clearance: 41.2 mL/min (by C-G formula based on Cr of 2.09).  Medications: Heparin @ 1500 units/hr  Assessment: 64yom s/p cardiogenic shock and Impella (removed 4/5) continues on heparin for afib. Heparin level this a.m. undetectable. RN reports no issues with line or bleeding. Unusual that level went down so much. CBC stable.  Goal of Therapy:  Heparin level 0.3-0.7 Monitor platelets by anticoagulation protocol: Yes   Plan:  Increase heparin to 1650 units/hr F/u 8 hr heparin level  Christoper Fabianaron Krish Bailly, PharmD, BCPS Clinical pharmacist, pager [phone]  05/05/2015 5:18 AM

## 2015-05-05 NOTE — Progress Notes (Signed)
CVVHD management. Remains on levophed and milrinone.   Metabolically stable.  Will need temp/PC in neck at some point.  Made small amt urine yest.  Repeat echo shows EF 15%.  If makes a little more urine today then may consider starting lasix tomorrow.

## 2015-05-05 NOTE — Progress Notes (Signed)
Spoke with ultrasound personnel about when they could do Wesley Tyler's ultrasound. They were unsure when they could get to him due to being busy in the ED. They will make him first one tomorrow morning and he will be NPO at midnight.

## 2015-05-06 ENCOUNTER — Inpatient Hospital Stay (HOSPITAL_COMMUNITY): Payer: Medicare Other

## 2015-05-06 LAB — RENAL FUNCTION PANEL
ALBUMIN: 2 g/dL — AB (ref 3.5–5.0)
ALBUMIN: 2.1 g/dL — AB (ref 3.5–5.0)
ANION GAP: 14 (ref 5–15)
Anion gap: 16 — ABNORMAL HIGH (ref 5–15)
BUN: 27 mg/dL — ABNORMAL HIGH (ref 6–20)
BUN: 27 mg/dL — ABNORMAL HIGH (ref 6–20)
CALCIUM: 8.7 mg/dL — AB (ref 8.9–10.3)
CHLORIDE: 97 mmol/L — AB (ref 101–111)
CO2: 22 mmol/L (ref 22–32)
CO2: 22 mmol/L (ref 22–32)
CREATININE: 2.07 mg/dL — AB (ref 0.61–1.24)
Calcium: 8.4 mg/dL — ABNORMAL LOW (ref 8.9–10.3)
Chloride: 98 mmol/L — ABNORMAL LOW (ref 101–111)
Creatinine, Ser: 2.19 mg/dL — ABNORMAL HIGH (ref 0.61–1.24)
GFR calc Af Amer: 35 mL/min — ABNORMAL LOW (ref >60)
GFR calc non Af Amer: 30 mL/min — ABNORMAL LOW (ref >60)
GFR, EST AFRICAN AMERICAN: 37 mL/min — AB (ref >60)
GFR, EST NON AFRICAN AMERICAN: 32 mL/min — AB (ref >60)
GLUCOSE: 103 mg/dL — AB (ref 65–99)
Glucose, Bld: 147 mg/dL — ABNORMAL HIGH (ref 65–99)
PHOSPHORUS: 5.1 mg/dL — AB (ref 2.5–4.6)
PHOSPHORUS: 4.2 mg/dL (ref 2.5–4.6)
POTASSIUM: 3.6 mmol/L (ref 3.5–5.1)
Potassium: 3.4 mmol/L — ABNORMAL LOW (ref 3.5–5.1)
SODIUM: 135 mmol/L (ref 135–145)
Sodium: 134 mmol/L — ABNORMAL LOW (ref 135–145)

## 2015-05-06 LAB — CBC
HEMATOCRIT: 40.2 % (ref 39.0–52.0)
Hemoglobin: 13.9 g/dL (ref 13.0–17.0)
MCH: 31.7 pg (ref 26.0–34.0)
MCHC: 34.6 g/dL (ref 30.0–36.0)
MCV: 91.8 fL (ref 78.0–100.0)
Platelets: 362 10*3/uL (ref 150–400)
RBC: 4.38 MIL/uL (ref 4.22–5.81)
RDW: 19.7 % — AB (ref 11.5–15.5)
WBC: 19 10*3/uL — ABNORMAL HIGH (ref 4.0–10.5)

## 2015-05-06 LAB — CARBOXYHEMOGLOBIN
Carboxyhemoglobin: 1.3 % (ref 0.5–1.5)
Methemoglobin: 0.7 % (ref 0.0–1.5)
O2 SAT: 52.7 %
Total hemoglobin: 14.3 g/dL (ref 13.5–18.0)

## 2015-05-06 LAB — GLUCOSE, CAPILLARY
GLUCOSE-CAPILLARY: 92 mg/dL (ref 65–99)
Glucose-Capillary: 203 mg/dL — ABNORMAL HIGH (ref 65–99)
Glucose-Capillary: 115 mg/dL — ABNORMAL HIGH (ref 65–99)
Glucose-Capillary: 191 mg/dL — ABNORMAL HIGH (ref 65–99)
Glucose-Capillary: 117 mg/dL — ABNORMAL HIGH (ref 65–99)

## 2015-05-06 LAB — HEPARIN LEVEL (UNFRACTIONATED)
HEPARIN UNFRACTIONATED: 0.97 [IU]/mL — AB (ref 0.30–0.70)
Heparin Unfractionated: 1.26 IU/mL — ABNORMAL HIGH (ref 0.30–0.70)

## 2015-05-06 LAB — MAGNESIUM: MAGNESIUM: 3 mg/dL — AB (ref 1.7–2.4)

## 2015-05-06 MED ORDER — CETYLPYRIDINIUM CHLORIDE 0.05 % MT LIQD
7.0000 mL | Freq: Two times a day (BID) | OROMUCOSAL | Status: DC
  Administered 2015-05-06: 7 mL via OROMUCOSAL

## 2015-05-06 MED ORDER — POTASSIUM CHLORIDE 20 MEQ/15ML (10%) PO SOLN
20.0000 meq | Freq: Two times a day (BID) | ORAL | Status: AC
  Administered 2015-05-06 (×2): 20 meq via ORAL
  Filled 2015-05-06 (×2): qty 15

## 2015-05-06 MED ORDER — HEPARIN (PORCINE) IN NACL 100-0.45 UNIT/ML-% IJ SOLN
1350.0000 [IU]/h | INTRAMUSCULAR | Status: DC
  Administered 2015-05-06: 1350 [IU]/h via INTRAVENOUS

## 2015-05-06 MED ORDER — HEPARIN BOLUS VIA INFUSION
1000.0000 [IU] | Freq: Once | INTRAVENOUS | Status: AC
  Administered 2015-05-06: 1000 [IU] via INTRAVENOUS
  Filled 2015-05-06: qty 1000

## 2015-05-06 MED ORDER — HEPARIN (PORCINE) IN NACL 100-0.45 UNIT/ML-% IJ SOLN
1500.0000 [IU]/h | INTRAMUSCULAR | Status: DC
  Administered 2015-05-06: 1500 [IU]/h via INTRAVENOUS
  Filled 2015-05-06: qty 250

## 2015-05-06 NOTE — Procedures (Signed)
Central Venous Catheter Insertion Procedure Note Pamala DuffelJoseph Tencza [id number] 11/27/1950  Procedure: Insertion of Central Venous Catheter Indications: Need for CVVH, freq. blood sampling.  Procedure Details Consent: Risks of procedure as well as the alternatives and risks of each were explained to the (patient/caregiver).  Consent for procedure obtained. Time Out: Verified patient identification, verified procedure, site/side was marked, verified correct patient position, special equipment/implants available, medications/allergies/relevent history reviewed, required imaging and test results available.  Performed  Maximum sterile technique was used including antiseptics, cap, gloves, gown, hand hygiene, mask and sheet. Skin prep: Chlorhexidine; local anesthetic administered A triple lumen (HD catheter plus additional auxillary access port -  "trialysis") catheter was placed in the left internal jugular vein using the Seldinger technique under ultrasound guidance.  Evaluation Blood flow good Complications: No apparent complications Patient did tolerate procedure well. Chest X-ray ordered to verify placement.  CXR: normal.  Rece Zechman 05/06/2015, 12:58 AM

## 2015-05-06 NOTE — Progress Notes (Addendum)
Advanced Heart Failure Rounding Note  PCP: Recently established with Tandy GawJade Breeback, PA-C - Family medicine (04/17/2015) Primary Cardiologist: New  Subjective:    Admitted 3/28 with cardiogenic shock.  He became critically worse with C.I. of 0.84 despite Impella and Dobutamine 1 on 3/29.  Dobutamine switched to milrinone as he was also getting amiodarone.  De-satted into 50% with profound respiratory fatigue and was intubated.   He developed progressive AKI and was started on CVVH.   Impella and Swan removed 4/5.  Extubated 4/6. On room air. Now on midodrine 10 TID. Remains on milrinone 0.25 mcg and norepi. Passed swallow evaluation. Remains anuric. Trialysis catheter moved to LIJ.    Co-ox down to 53%, CVP 4-5. Awake. Alert. Denies SOB.   Bilirubin: Total 13.5  Direct 6.2/Indirect7.3. WBC up to 19. Finished Nafcillin this am for Group C strep in sputum. CXR cleared.   TEE (3/29):  EF 5-10% diffuse hypokinesis, dilated RV with severely decreased systolic function, moderate AI, moderate MR, severe TR. Spontaneous left to right bubbles but unable to discover source (not ASD or VSD, ?pulmonary AVMs).   Echo 4/7: LV 10% RV moderately depressed (reviewed personally)  RUQ u/s: GB sludge    Objective:   Weight Range: 77.3 kg (170 lb 6.7 oz) Body mass index is 20.75 kg/(m^2).   Vital Signs:   Temp:  [97.9 F (36.6 C)-98.4 F (36.9 C)] 98.4 F (36.9 C) (04/09 0800) Resp:  [14-30] 22 (04/09 1215) BP: (71-153)/(29-112) 83/64 mmHg (04/09 1215) SpO2:  [98 %-100 %] 100 % (04/09 1200) Arterial Line BP: (90-105)/(56-66) 102/66 mmHg (04/08 2015) Weight:  [77.3 kg (170 lb 6.7 oz)] 77.3 kg (170 lb 6.7 oz) (04/09 0335) Last BM Date: 05/06/15  Weight change: Filed Weights   05/04/15 0500 05/05/15 0500 05/06/15 0335  Weight: 82.6 kg (182 lb 1.6 oz) 81.5 kg (179 lb 10.8 oz) 77.3 kg (170 lb 6.7 oz)    Intake/Output:   Intake/Output Summary (Last 24 hours) at 05/06/15 1409 Last data  filed at 05/06/15 1200  Gross per 24 hour  Intake 1640.65 ml  Output   4105 ml  Net -2464.35 ml     Physical Exam: CVP 4-5  General: NAD   HEENT: normal Neck: supple. Carotids 2+ bilat; no bruits. No thyromegaly or lymphadenopathy. LIJ trailysis cath Cor: PMI displaced laterally. Regular rate & rhythm. +S3 Lungs: Clear on room air.  Abdomen: soft, NT, distended. No HSM. No bruits or masses. +BS  Extremities: warm  R and LLE no edema. Ted hose on.  R groin HD catheter-small amount of bleeding. Left groin ecchymosis stable.  Neuro: Alert and Oriented x3. Follows commands.    Telemetry: Reviewed, A flutter 100s   Labs: CBC  Recent Labs  05/05/15 0427 05/06/15 0402  WBC 13.9* 19.0*  HGB 12.6* 13.9  HCT 37.6* 40.2  MCV 91.3 91.8  PLT 271 362   Basic Metabolic Panel  Recent Labs  05/05/15 0427 05/05/15 2030 05/06/15 0402  NA 132* 136 134*  K 4.0 3.5 3.6  CL 96* 96* 98*  CO2 22 23 22   GLUCOSE 138* 124* 103*  BUN 26* 27* 27*  CREATININE 2.09* 2.25* 2.19*  CALCIUM 8.1* 8.6* 8.4*  MG 2.9*  --  3.0*  PHOS 4.5 6.8* 5.1*   Liver Function Tests  Recent Labs  05/04/15 0358  05/05/15 1606 05/05/15 2030 05/06/15 0402  AST 79*  --   --   --   --   ALT 126*  --   --   --   --  ALKPHOS 138*  --   --   --   --   BILITOT 13.2*  --  13.5*  --   --   PROT 6.2*  --   --   --   --   ALBUMIN 2.0*  < >  --  2.1* 2.0*  < > = values in this interval not displayed. No results for input(s): LIPASE, AMYLASE in the last 72 hours. Cardiac Enzymes No results for input(s): CKTOTAL, CKMB, CKMBINDEX, TROPONINI in the last 72 hours.  BNP: BNP (last 3 results)  Recent Labs  04/15/2015 2338  BNP 612.8*    ProBNP (last 3 results) No results for input(s): PROBNP in the last 8760 hours.   D-Dimer No results for input(s): DDIMER in the last 72 hours. Hemoglobin A1C No results for input(s): HGBA1C in the last 72 hours. Fasting Lipid Panel No results for input(s): CHOL, HDL,  LDLCALC, TRIG, CHOLHDL, LDLDIRECT in the last 72 hours. Thyroid Function Tests No results for input(s): TSH, T4TOTAL, T3FREE, THYROIDAB in the last 72 hours.  Invalid input(s): FREET3  Other results:     Imaging/Studies:  Dg Chest Port 1 View  05/06/2015  CLINICAL DATA:  Central line placement EXAM: PORTABLE CHEST 1 VIEW COMPARISON:  05/03/2015 FINDINGS: There is a new left jugular central line with tip extending into the SVC 2 cm below the azygos vein junction. Right jugular sheath remains. Endotracheal tube and nasogastric tube have been removed. There is no pneumothorax. There are mild residual linear basilar opacities, with significant clearance of the basilar opacities since 05/03/2015. IMPRESSION: Support equipment appears satisfactorily positioned. Improved, with near complete clearance of basilar opacities. Electronically Signed   By: Ellery Plunk M.D.   On: 05/06/2015 01:22   US Abdomen Limited Ruq  05/06/2015  CLINICAL DATA:  Liver failure. EXAM: US ABDOMEN LIMITED - RIGHT UPPER QUADRANT COMPARISON:  None. FINDINGS: The study is a limited due to patient's inability to cooperate and shadowing bowel gas. There is sludge in the gallbladder with no cholelithiasis or Murphy's sign. No pericholecystic fluid. The gallbladder wall is borderline measuring 3.2 mm. The common bile duct measures approximately 2.5 mm on today's study but is poorly visualized. Limited views of the portal vein demonstrates normal directional blood flow. There is a hypoechoic mass in the liver adjacent to the gallbladder fossa measuring 1 x 1.2 x 1.3 cm. Portions of the liver could not be visualized due to bowel gas. Multiple renal cysts are seen with the largest measuring 6.9 cm. A right pleural fluid effusion is identified. IMPRESSION: 1. Sludge in the gallbladder. The gallbladder wall is borderline. No pericholecystic fluid or Murphy's sign. If there is concern for cholecystitis, a HIDA scan could further evaluate.  2. Evaluation of the liver is limited. A 1.3 cm hypoechoic mass is nonspecific but could represent a complicated cyst. 3. Multiple renal cysts. 4. Right pleural effusion. Electronically Signed   By: Gerome Sam III M.D   On: 05/06/2015 08:47    Latest Echo  Latest Cath   Medications:     Scheduled Medications: . amiodarone  200 mg Oral BID  . antiseptic oral rinse  7 mL Mouth Rinse q12n4p  . chlorhexidine  15 mL Mouth Rinse BID  . feeding supplement (NEPRO CARB STEADY)  237 mL Oral BID BM  . insulin aspart  0-15 Units Subcutaneous 6 times per day  . midodrine  10 mg Oral 3 times per day  . nafcillin IV  2 g Intravenous 4  times per day  . pantoprazole  40 mg Oral Daily  . potassium chloride  20 mEq Oral BID  . sodium chloride flush  10-40 mL Intracatheter Q12H    Infusions: . heparin 1,500 Units/hr (05/06/15 1312)  . milrinone 0.25 mcg/kg/min (05/06/15 0357)  . norepinephrine (LEVOPHED) Adult infusion 12 mcg/min (05/06/15 0305)  . dialysis replacement fluid (prismasate) 700 mL/hr at 05/06/15 0656  . dialysis replacement fluid (prismasate) 300 mL/hr at 05/05/15 1106  . dialysate (PRISMASATE) 1,500 mL/hr at 05/06/15 1311    PRN Medications: sodium chloride, acetaminophen, fentaNYL (SUBLIMAZE) injection, heparin, heparin, ondansetron (ZOFRAN) IV, RESOURCE THICKENUP CLEAR, sodium chloride, sodium chloride flush   Assessment/Plan   Wesley Tyler is a 66 y.o. male sent to [facility] after PCP labs came back abnormal with mildly elevated troponin. Was found to be in florid HF and transferred to [facility] for further work up. He has no known medical history, as he has not seen an MD in > 40 years.  1. Acute systolic CHF/cardiogenic shock: Steadily worsening dyspnea over the last month, no clear trigger. Markedly worse 3/28, came to ER. EF 10% on bedside echo. Noted to be in atrial fibrillation and hypotensive, started initially on phenylephrine but transitioned over to dobutamine. No  chest pain, slight troponin elevation is likely explained by cardiogenic shock/volume overload. Ddx for cardiomyopathy = coronary disease (?chronic) versus myocarditis versus tachy-mediated with atrial fibrillation of uncertain duration. Required Impella placement with low cardiac output on 3/29 (CI 1.3 by thermodilution on dobutamine).  He decompensated and was intubated. Remains on norepinephrine and milrinone, off vasopressin. Impella removed 4/5. CVP 4-5 now with CVVH and weight down.  - CO-OX 67%-> 53%. Continue milrinone 0.25 mcg/kg/min.  Wean norepinephrine as able. Continue midodrine 10 mg tid to aid norepinephrine wean.   - Continue UF via CVVH, CVP down to 4-5. Trialysis cath now in L IJ. Can pull RFV trialysis - If he recovers, will eventually need coronary angiography.  2. Acute hypoxic respiratory failure:  Now extubated.  3. AKI: Suspect ATN, cardiorenal/related to low output HF and hypotension. Currently on CVVH. Remains anuric.  4. Atrial fibrillation: On heparin and amiodarone gtt for rate control.  Uncertain duration of atrial fibrillation, cannot rule out tachy-mediated CMP. In A flutter. Eventual TEE-guided DCCV. On amio 200 mg twice a day. 5. Endo: Elevated TSH and mildly elevated free T4. ?Sick euthyroid. Will follow.  6. Elevated LFTs: Suspect shock liver.  LFTs rose prior to amiodarone initiation but LFTs coming down now. Bili up to 13. Half direct/indirect. RUQ u/s 4/9 with sludge.  Suspect RV failure major issue. .   7. ID: Probable sepsis, fever 3/30,. Blood cultures negative.. Group C Strep in sputum cultures, finished nafcillin. WBC climbing. Will reculture today. CXR ok.  9. Hyponatremia- Na 134  He has persistent biventricular failure with concomitant renal and liver failure. There has been no ventricular recovery on echo. I think prognosis is extremely poor unless he has some cardiac or renal recovery. Will plan TEE/DC-CV early this week to see if restoration of NSR  might aid in ventricular recovery. Will continue CVVHD for now. Pull RFV catheter and then try to ambulate with PT.   WBC climbing. Now off nafcillin. Recheck BCX in am. Pull RFV catheter.    Plan TEE/DC-CV this week likely Tuesday   Critical Care Time devoted to patient care services described in this note is 35 Minutes.   The patient is critically ill with multiple organ systems failure and requires  high complexity decision making for assessment and support, frequent evaluation and titration of therapies, application of advanced monitoring technologies and extensive interpretation of multiple databases.   Length of Stay: 11  Arvilla Meres MD 05/06/2015, 2:09 PM  Advanced Heart Failure Team Pager [phone] (M-F; 7a - 4p)  Please contact [facility] for night-coverage after hours (4p -7a ) and weekends on [email or web address]

## 2015-05-06 NOTE — Progress Notes (Signed)
CVVHD management.  Metabolically stable.  Still on milrinone and levophed.  Not eating much.  EF 15%.  Will give some oral K.  Overall prognosis is poor.

## 2015-05-06 NOTE — Progress Notes (Addendum)
ANTICOAGULATION CONSULT NOTE - Follow Up Consult  Pharmacy Consult for Heparin  Indication: afib  No Known Allergies  Patient Measurements: Height: 6\' 4"  (193 cm) Weight: 170 lb 6.7 oz (77.3 kg) IBW/kg (Calculated) : 86.8   Vital Signs: Temp: 98.4 F (36.9 C) (04/09 0800) Temp Source: Oral (04/09 0800) BP: 94/59 mmHg (04/09 0900)  Labs:  Recent Labs  05/04/15 0358  05/05/15 0427 05/05/15 1344 05/05/15 2030 05/06/15 0402 05/06/15 0933  HGB 12.1*  --  12.6*  --   --  13.9  --   HCT 36.2*  --  37.6*  --   --  40.2  --   PLT 206  --  271  --   --  362  --   APTT 105*  --   --   --   --   --   --   HEPARINUNFRC 0.44  --  <0.10* 0.44  --   --  0.97*  CREATININE 2.53*  < > 2.09*  --  2.25* 2.19*  --   < > = values in this interval not displayed.  Estimated Creatinine Clearance: 36.8 mL/min (by C-G formula based on Cr of 2.19).   Assessment: 64yom s/p cardiogenic shock and Impella (removed 4/5) continues on heparin for afib. Heparin drip 1650 uts/hr heparin level this a.m 0.94 at same rate as yesterday which gave HL 0.44.  Level drawn from CL in opposite arm as peripheral IV where heparin is infusiing. RN reports no issues with line or bleeding.  CBC stable.  Goal of Therapy:  Heparin level 0.3-0.7 Monitor platelets by anticoagulation protocol: Yes   Plan:  Hold heparin 1hr  Decrease heparin to 1500 units/hr Recheck HL in 6hr  daily CBC, HL,   Leota SauersLisa Curran Pharm.D. CPP, BCPS Clinical Pharmacist [phone] 05/06/2015 11:12 AM  '     Addendum: Heparin level still remains elevated and has increased to 1.26. Hold infusion for ~1.5 hours (communicated this to the nurse) and resume at 1350 units/hr. Follow up AM labs.  Louie CasaJennifer Armand Preast, PharmD, BCPS 05/06/2015, 7:49 PM

## 2015-05-07 ENCOUNTER — Inpatient Hospital Stay (HOSPITAL_COMMUNITY): Payer: Medicare Other

## 2015-05-07 LAB — RENAL FUNCTION PANEL
Albumin: 2.2 g/dL — ABNORMAL LOW (ref 3.5–5.0)
Anion gap: 21 — ABNORMAL HIGH (ref 5–15)
BUN: 22 mg/dL — ABNORMAL HIGH (ref 6–20)
CO2: 19 mmol/L — ABNORMAL LOW (ref 22–32)
Calcium: 9.1 mg/dL (ref 8.9–10.3)
Chloride: 98 mmol/L — ABNORMAL LOW (ref 101–111)
Creatinine, Ser: 1.87 mg/dL — ABNORMAL HIGH (ref 0.61–1.24)
GFR calc Af Amer: 42 mL/min — ABNORMAL LOW (ref >60)
GFR calc non Af Amer: 36 mL/min — ABNORMAL LOW (ref >60)
Glucose, Bld: 133 mg/dL — ABNORMAL HIGH (ref 65–99)
Phosphorus: 4.9 mg/dL — ABNORMAL HIGH (ref 2.5–4.6)
Potassium: 3.9 mmol/L (ref 3.5–5.1)
Sodium: 138 mmol/L (ref 135–145)

## 2015-05-07 LAB — CARBOXYHEMOGLOBIN
Carboxyhemoglobin: 1.2 % (ref 0.5–1.5)
Carboxyhemoglobin: 0.8 % (ref 0.5–1.5)
Methemoglobin: 0.7 % (ref 0.0–1.5)
Methemoglobin: 0.8 % (ref 0.0–1.5)
O2 Saturation: 36.5 %
O2 Saturation: 29.5 %
Total hemoglobin: 15.3 g/dL (ref 13.5–18.0)
Total hemoglobin: 15.4 g/dL (ref 13.5–18.0)

## 2015-05-07 LAB — CBC
HEMATOCRIT: 43.5 % (ref 39.0–52.0)
HEMOGLOBIN: 15.1 g/dL (ref 13.0–17.0)
MCH: 32.1 pg (ref 26.0–34.0)
MCHC: 34.7 g/dL (ref 30.0–36.0)
MCV: 92.4 fL (ref 78.0–100.0)
Platelets: 419 10*3/uL — ABNORMAL HIGH (ref 150–400)
RBC: 4.71 MIL/uL (ref 4.22–5.81)
RDW: 20.6 % — ABNORMAL HIGH (ref 11.5–15.5)
WBC: 31 10*3/uL — ABNORMAL HIGH (ref 4.0–10.5)

## 2015-05-07 LAB — MAGNESIUM: Magnesium: 3.1 mg/dL — ABNORMAL HIGH (ref 1.7–2.4)

## 2015-05-07 LAB — GLUCOSE, CAPILLARY
Glucose-Capillary: 189 mg/dL — ABNORMAL HIGH (ref 65–99)
Glucose-Capillary: 134 mg/dL — ABNORMAL HIGH (ref 65–99)

## 2015-05-07 LAB — HEPARIN LEVEL (UNFRACTIONATED): HEPARIN UNFRACTIONATED: 1.2 [IU]/mL — AB (ref 0.30–0.70)

## 2015-05-07 MED ORDER — MIDAZOLAM HCL 2 MG/2ML IJ SOLN
INTRAMUSCULAR | Status: AC
  Administered 2015-05-07: 4 mg
  Filled 2015-05-07: qty 4

## 2015-05-07 MED ORDER — DEXTROSE 5 % IV SOLN
5.0000 mg/h | INTRAVENOUS | Status: DC
  Filled 2015-05-07: qty 10

## 2015-05-07 MED ORDER — SODIUM CHLORIDE 0.9 % IV SOLN
2.0000 mg/h | INTRAVENOUS | Status: DC
  Filled 2015-05-07: qty 10

## 2015-05-07 MED ORDER — MORPHINE SULFATE (PF) 4 MG/ML IV SOLN
INTRAVENOUS | Status: AC
  Administered 2015-05-07: 4 mg
  Filled 2015-05-07: qty 1

## 2015-05-07 MED ORDER — HEPARIN (PORCINE) IN NACL 100-0.45 UNIT/ML-% IJ SOLN: 1000.0000 [IU]/h | INTRAMUSCULAR | Status: DC

## 2015-05-11 LAB — CULTURE, BLOOD (ROUTINE X 2)
Culture: NO GROWTH
Culture: NO GROWTH

## 2015-05-28 NOTE — Progress Notes (Signed)
Subjective:  CRRT running- removed another 3 liters - CVP less than 5- still on milrinone and norepi- cards at bedside - pt not doing well-  Objective Vital signs in last 24 hours: Filed Vitals:   05/04/2015 0500 05/14/2015 0530 05/27/2015 0600 05/19/2015 0655  BP: 94/81 104/61 111/96   Pulse:    98  Temp:      TempSrc:      Resp: 33 32 34 35  Height:      Weight:      SpO2: 98%   74%   Weight change: -3.7 kg (-8 lb 2.5 oz)  Intake/Output Summary (Last 24 hours) at 05/04/2015 0720 Last data filed at 05/01/2015 0700  Gross per 24 hour  Intake 1210.7 ml  Output   4712 ml  Net -3501.3 ml    Assessment/ Plan: Pt is a 65 y.o. yo male who was admitted on 12/25/15 with biventricular failure and AKI  Assessment/Plan: 1. Renal- on CRRT since 3/30- no UOP continues- pt not doing well clinically right now according to cards- they are going to proceed with limiting aggressive management and will stop CRRT- renal will sign off   Mahira Petras A    Labs: Basic Metabolic Panel:  Recent Labs Lab 05/06/15 0402 05/06/15 1705 05/24/2015 0430  NA 134* 135 138  K 3.6 3.4* 3.9  CL 98* 97* 98*  CO2 22 22 19*  GLUCOSE 103* 147* 133*  BUN 27* 27* 22*  CREATININE 2.19* 2.07* 1.87*  CALCIUM 8.4* 8.7* 9.1  PHOS 5.1* 4.2 4.9*   Liver Function Tests:  Recent Labs Lab 05/02/15 0400  05/03/15 0450  05/04/15 0358  05/05/15 1606  05/06/15 0402 05/06/15 1705 05/17/2015 0430  AST 94*  --  76*  --  79*  --   --   --   --   --   --   ALT 185*  --  153*  --  126*  --   --   --   --   --   --   ALKPHOS 144*  --  153*  --  138*  --   --   --   --   --   --   BILITOT 13.8*  --  12.6*  --  13.2*  --  13.5*  --   --   --   --   PROT 6.4*  --  6.8  --  6.2*  --   --   --   --   --   --   ALBUMIN 2.1*  2.1*  < > 2.1*  2.1*  < > 2.0*  < >  --   < > 2.0* 2.1* 2.2*  < > = values in this interval not displayed. No results for input(s): LIPASE, AMYLASE in the last 168 hours. No results for input(s):  AMMONIA in the last 168 hours. CBC:  Recent Labs Lab 05/03/15 0747 05/04/15 0358 05/05/15 0427 05/06/15 0402 05/04/2015 0430  WBC 14.8* 15.7* 13.9* 19.0* 31.0*  HGB 12.6* 12.1* 12.6* 13.9 15.1  HCT 38.2* 36.2* 37.6* 40.2 43.5  MCV 94.1 91.6 91.3 91.8 92.4  PLT 169 206 271 362 419*   Cardiac Enzymes: No results for input(s): CKTOTAL, CKMB, CKMBINDEX, TROPONINI in the last 168 hours. CBG:  Recent Labs Lab 05/06/15 1216 05/06/15 1652 05/06/15 1947 05/06/15 2358 04/30/2015 0441  GLUCAP 115* 191* 117* 189* 134*    Iron Studies: No results for input(s): IRON, TIBC, TRANSFERRIN, FERRITIN in the last 72 hours.  Studies/Results: Dg Chest Port 1 View  05/06/2015  CLINICAL DATA:  Central line placement EXAM: PORTABLE CHEST 1 VIEW COMPARISON:  05/03/2015 FINDINGS: There is a new left jugular central line with tip extending into the SVC 2 cm below the azygos vein junction. Right jugular sheath remains. Endotracheal tube and nasogastric tube have been removed. There is no pneumothorax. There are mild residual linear basilar opacities, with significant clearance of the basilar opacities since 05/03/2015. IMPRESSION: Support equipment appears satisfactorily positioned. Improved, with near complete clearance of basilar opacities. Electronically Signed   By: Ellery Plunk M.D.   On: 05/06/2015 01:22   US Abdomen Limited Ruq  05/06/2015  CLINICAL DATA:  Liver failure. EXAM: US ABDOMEN LIMITED - RIGHT UPPER QUADRANT COMPARISON:  None. FINDINGS: The study is a limited due to patient's inability to cooperate and shadowing bowel gas. There is sludge in the gallbladder with no cholelithiasis or Murphy's sign. No pericholecystic fluid. The gallbladder wall is borderline measuring 3.2 mm. The common bile duct measures approximately 2.5 mm on today's study but is poorly visualized. Limited views of the portal vein demonstrates normal directional blood flow. There is a hypoechoic mass in the liver adjacent to  the gallbladder fossa measuring 1 x 1.2 x 1.3 cm. Portions of the liver could not be visualized due to bowel gas. Multiple renal cysts are seen with the largest measuring 6.9 cm. A right pleural fluid effusion is identified. IMPRESSION: 1. Sludge in the gallbladder. The gallbladder wall is borderline. No pericholecystic fluid or Murphy's sign. If there is concern for cholecystitis, a HIDA scan could further evaluate. 2. Evaluation of the liver is limited. A 1.3 cm hypoechoic mass is nonspecific but could represent a complicated cyst. 3. Multiple renal cysts. 4. Right pleural effusion. Electronically Signed   By: Gerome Sam III M.D   On: 05/06/2015 08:47   Medications: Infusions: . heparin    . milrinone 0.25 mcg/kg/min (05/06/15 1926)  . norepinephrine (LEVOPHED) Adult infusion 12 mcg/min (2015-05-11 0553)  . dialysis replacement fluid (prismasate) 700 mL/hr at 2015-05-11 0453  . dialysis replacement fluid (prismasate) 300 mL/hr at 05/06/15 2211  . dialysate (PRISMASATE) 1,500 mL/hr at 05/11/2015 0539    Scheduled Medications: . amiodarone  200 mg Oral BID  . antiseptic oral rinse  7 mL Mouth Rinse BID  . feeding supplement (NEPRO CARB STEADY)  237 mL Oral BID BM  . insulin aspart  0-15 Units Subcutaneous 6 times per day  . midodrine  10 mg Oral 3 times per day  . pantoprazole  40 mg Oral Daily  . sodium chloride flush  10-40 mL Intracatheter Q12H    have reviewed scheduled and prn medications.  Physical Exam: General:     PE not done due to clinical situation Heart: Lungs: Abdomen: Extremities:    May 11, 2015,7:20 AM  LOS: 12 days

## 2015-05-28 NOTE — Discharge Summary (Signed)
Advanced Heart Failure Death Summary  Death Summary   Patient ID: Wesley Tyler MRN: [id number], DOB/AGE: 66/07/1950 66 y.o. Admit date: 04/14/2015 D/C date:     05/20/2015   Primary Discharge Diagnoses:  1. Acute systolic CHF with severe biventricular failure and cardiogenic shock -Echo 05/04/15 - LV 10% with moderately depressed RV. 2. Acute hypoxic respiratory failure requiring intubation 3. AKI 2/2 ATN with low output HF/cardiorenal syndrome  - required CVVH 4. Atrial fibrillation, rate controlled 5. Acute liver failure due to biventricular HF 6. Pneumonia - Group C Strep, finished nafcillin 7. Hyponatremia - nadir at 131 8. Severe protein calorie malnutrition 9. Moderate to severe valvular disease    --Moderate MR/Severe TR 10. Sepsis   Hospital Course:   Wesley Tyler is a 65 y.o. With no known medical history, as he has not seen an MD in > 40 years. [facility] with cardiogenic shock and evaluated by Dr. Antoine PocheHochrein and the CCM service. He was [facility] and followed by the Advanced HF Service.  On [facility] he was found to be in profound shock with multi-system organ failure despite inotropic support. He was taking urgently for placement of an Impella CP device. Coronary angiography not performed due to renal failure. He was also maintained on dual pressors and amiodarone for AF with RVR.   On the afternoon of 29-Nov-2015, he developed progressive  respiratory failure and required intubation. Prior to intubation, pt verbalized that he did not wish to have chest compressions, but was willing to undergo a trial period of intubation. His cousin (his only known living relative) witnessed this conversation and agreed with the plan. He was found to have PNA and was started on broad spectrum abx. TEE done to evaluate Impella function (and air bubbles in LV) showed EF 5-10% with diffuse hypokinesis and severely decreased RV function, multiple valvular  disease.   He developed renal failure due to shock/ATN and femoral trialysis catheter placed on 04/26/15 for CVVH access and CRT started. Over the course of admission, CRT removed a total of 16.5 L and his weight was down 60 lbs. His renal function never recovered despite full support.   With full support he began to make a moderate recovery. Impella weaned down with removal on 05/02/15. He continued to require pressor support with milrinone and levophed with soft BPs. Midodrine added for extra BP support in continued attempts to wean. Pt was extubated 05/03/15 and was stable, though tenuous, for a time on room air. He passed swallow evaluation and was awake and alert, able to converse with staff and with his cousin.  Unfortunately on 4/9 his condition again began to deteriorate. Patient felt to have combination of worsening HF and sepsis. On the morning of 05/01/2015, his mixed venous saturation was noted to be severely low at 36.5%. Levophed increased but mixed venous saturation continued to decline to 29%. Patient decompensated with oxygen saturation into the 50s with agonal respiration and bradycardia.  Given lack of recovery in LV function and renal function. Prognosis for a meaningful outcome was felt to be poor. Family was contacted and decision to withdraw treatment and start comfort care was made.  Pt given bolus of morphine x 2 and versed x 1 with improvement of symptoms.  Once taken off BiPAP, pts breathing slowed down to eventual apnea. He progressed to PEA arrest, then to VFIB, and finally asystole, confirmed by telemetry.  He was pronounced at 0802 with family and  providers at bedside with chaplain support for his family.  Duration of Discharge Encounter: Greater than 35 minutes   Signed, Graciella Freer PA-C 05/28/15, 11:08 AM   Agree with above. I have edited note with my changes.  Chaquita Basques,MD 9:19 PM

## 2015-05-28 NOTE — Progress Notes (Signed)
Pt expired at 0802 with family and MD at bedside.  Chaplain also at bedside for the cousin.  2 RN's listened to confirm TOD.  Belongings sent home with next of kin, the cousin.  Eliane DecreeSmith, Lizet Kelso Moore, RN

## 2015-05-28 NOTE — Progress Notes (Signed)
CRITICAL VALUE ALERT  Critical value received:  Coox= 36.5 Date of notification:  08-29-15  Time of notification:  0505  Critical value read back:yes  Nurse who received alert: Anselm LisMelvin kuffour  MD notified (1st page):  Bensimhon  Time of first page:  0511  MD notified (2nd page):  Time of second page:  Responding MD:  Bensimhon  Time MD responded:  [id number]

## 2015-05-28 NOTE — Progress Notes (Signed)
Advanced Heart Failure Rounding Note  PCP: Recently established with Tandy Gaw, PA-C - Family medicine (04/01/2015) Primary Cardiologist: New  Subjective:    Admitted 3/28 with cardiogenic shock.  He became critically worse with C.I. of 0.84 despite Impella and Dobutamine 1 on 3/29.  Dobutamine switched to milrinone as he was also getting amiodarone.  De-satted into 50% with profound respiratory fatigue and was intubated.   He developed progressive AKI and was started on CVVH.   TEE (3/29):  EF 5-10% diffuse hypokinesis, dilated RV with severely decreased systolic function, moderate AI, moderate MR, severe TR. Spontaneous left to right bubbles but unable to discover source (not ASD or VSD, ?pulmonary AVMs).   Impella and Swan removed 4/5.  Extubated 4/6. Passed swallow evaluation. Trialysis catheter moved to LIJ.   Echo 4/7: LV 10% RV moderately depressed (reviewed personally) Bilirubin 05/05/15 Total 13.5  Direct 6.2/Indirect7.3.  05/06/15 RUQ u/s: GB sludge   Co-ox 36.5% this am. Stat repeat 29.5% despite increase in levophed. Remain on milrinone 0.25. Remains on CVVH.  WBC up to 31. Afebrile. BCx re-sent 05/06/15 Finished Nafcillin 05/06/15 for Group C strep in sputum.  Just prior to my examination patient began de-satting into 59s and we were called into room. Currently on BiPAP.  Pt unresponsive to verbal or painful stimuli.  Family notified and on their way.    Objective:   Weight Range: 162 lb 4.1 oz (73.6 kg) Body mass index is 19.76 kg/(m^2).   Vital Signs:   Temp:  [96.8 F (36 C)-98.4 F (36.9 C)] 97 F (36.1 C) (04/10 0436) Pulse Rate:  [98] 98 (04/10 0655) Resp:  [16-35] 32 (04/10 0703) BP: (80-153)/(31-100) 105/94 mmHg (04/10 0703) SpO2:  [74 %-100 %] 85 % (04/10 0703) Weight:  [162 lb 4.1 oz (73.6 kg)] 162 lb 4.1 oz (73.6 kg) (04/10 0436) Last BM Date: 05/06/15  Weight change: Filed Weights   05/05/15 0500 05/06/15 0335 05/15/2015 0436  Weight: 179 lb 10.8 oz  (81.5 kg) 170 lb 6.7 oz (77.3 kg) 162 lb 4.1 oz (73.6 kg)    Intake/Output:   Intake/Output Summary (Last 24 hours) at 05/06/2015 0735 Last data filed at 04/28/2015 0700  Gross per 24 hour  Intake 1210.7 ml  Output   4712 ml  Net -3501.3 ml     Physical Exam:  General: Non responsive, chronically ill appearing HEENT: BiPAP in place. Neck: supple. Carotids 2+ bilat; no bruits. No thyromegaly or nodule noted. LIJ trailysis cath Cor: PMI displaced laterally. Irregularly irregular. +S3 Lungs: Coarse throughout Abdomen: soft, NT, distended. No HSM. No bruits or masses. +BS  Extremities: warm  R and LLE no edema. Ted hose on.   Left groin ecchymosis stable.  Neuro: Unresponsive.   Telemetry: Reviewed, A flutter 90-100s initially, now brady down into 50s.  Labs: CBC  Recent Labs  05/06/15 0402 05/12/2015 0430  WBC 19.0* 31.0*  HGB 13.9 15.1  HCT 40.2 43.5  MCV 91.8 92.4  PLT 362 419*   Basic Metabolic Panel  Recent Labs  05/06/15 0402 05/06/15 1705 05/08/2015 0430  NA 134* 135 138  K 3.6 3.4* 3.9  CL 98* 97* 98*  CO2 22 22 19*  GLUCOSE 103* 147* 133*  BUN 27* 27* 22*  CREATININE 2.19* 2.07* 1.87*  CALCIUM 8.4* 8.7* 9.1  MG 3.0*  --  3.1*  PHOS 5.1* 4.2 4.9*   Liver Function Tests  Recent Labs  05/05/15 1606  05/06/15 1705 04/29/2015 0430  BILITOT 13.5*  --   --   --  ALBUMIN  --   < > 2.1* 2.2*  < > = values in this interval not displayed. No results for input(s): LIPASE, AMYLASE in the last 72 hours. Cardiac Enzymes No results for input(s): CKTOTAL, CKMB, CKMBINDEX, TROPONINI in the last 72 hours.  BNP: BNP (last 3 results)  Recent Labs  04/22/2015 2338  BNP 612.8*    ProBNP (last 3 results) No results for input(s): PROBNP in the last 8760 hours.   D-Dimer No results for input(s): DDIMER in the last 72 hours. Hemoglobin A1C No results for input(s): HGBA1C in the last 72 hours. Fasting Lipid Panel No results for input(s): CHOL, HDL, LDLCALC, TRIG,  CHOLHDL, LDLDIRECT in the last 72 hours. Thyroid Function Tests No results for input(s): TSH, T4TOTAL, T3FREE, THYROIDAB in the last 72 hours.  Invalid input(s): FREET3  Other results:     Imaging/Studies:  Dg Chest Port 1 View  May 10, 2015  CLINICAL DATA:  Desaturation.  Shortness of breath EXAM: PORTABLE CHEST 1 VIEW COMPARISON:  05/06/2015 FINDINGS: Bilateral internal jugular central lines remain in place, unchanged. Heart is normal size. No confluent opacities or effusions. No acute bony abnormality. IMPRESSION: No active disease. Electronically Signed   By: Charlett Nose M.D.   On: May 10, 2015 07:33   Dg Chest Port 1 View  05/06/2015  CLINICAL DATA:  Central line placement EXAM: PORTABLE CHEST 1 VIEW COMPARISON:  05/03/2015 FINDINGS: There is a new left jugular central line with tip extending into the SVC 2 cm below the azygos vein junction. Right jugular sheath remains. Endotracheal tube and nasogastric tube have been removed. There is no pneumothorax. There are mild residual linear basilar opacities, with significant clearance of the basilar opacities since 05/03/2015. IMPRESSION: Support equipment appears satisfactorily positioned. Improved, with near complete clearance of basilar opacities. Electronically Signed   By: Ellery Plunk M.D.   On: 05/06/2015 01:22   US Abdomen Limited Ruq  05/06/2015  CLINICAL DATA:  Liver failure. EXAM: US ABDOMEN LIMITED - RIGHT UPPER QUADRANT COMPARISON:  None. FINDINGS: The study is a limited due to patient's inability to cooperate and shadowing bowel gas. There is sludge in the gallbladder with no cholelithiasis or Murphy's sign. No pericholecystic fluid. The gallbladder wall is borderline measuring 3.2 mm. The common bile duct measures approximately 2.5 mm on today's study but is poorly visualized. Limited views of the portal vein demonstrates normal directional blood flow. There is a hypoechoic mass in the liver adjacent to the gallbladder fossa  measuring 1 x 1.2 x 1.3 cm. Portions of the liver could not be visualized due to bowel gas. Multiple renal cysts are seen with the largest measuring 6.9 cm. A right pleural fluid effusion is identified. IMPRESSION: 1. Sludge in the gallbladder. The gallbladder wall is borderline. No pericholecystic fluid or Murphy's sign. If there is concern for cholecystitis, a HIDA scan could further evaluate. 2. Evaluation of the liver is limited. A 1.3 cm hypoechoic mass is nonspecific but could represent a complicated cyst. 3. Multiple renal cysts. 4. Right pleural effusion. Electronically Signed   By: Gerome Sam III M.D   On: 05/06/2015 08:47    Latest Echo  Latest Cath   Medications:     Scheduled Medications: . amiodarone  200 mg Oral BID  . antiseptic oral rinse  7 mL Mouth Rinse BID  . feeding supplement (NEPRO CARB STEADY)  237 mL Oral BID BM  . insulin aspart  0-15 Units Subcutaneous 6 times per day  . midodrine  10 mg  Oral 3 times per day  . morphine      . pantoprazole  40 mg Oral Daily  . sodium chloride flush  10-40 mL Intracatheter Q12H    Infusions: . heparin    . midazolam (VERSED) infusion    . milrinone 0.25 mcg/kg/min (05/06/15 1926)  . morphine    . norepinephrine (LEVOPHED) Adult infusion 12 mcg/min (2015/06/05 0553)  . dialysis replacement fluid (prismasate) 700 mL/hr at 06/05/15 0453  . dialysis replacement fluid (prismasate) 300 mL/hr at 05/06/15 2211  . dialysate (PRISMASATE) 1,500 mL/hr at 06/05/2015 0539    PRN Medications: sodium chloride, acetaminophen, fentaNYL (SUBLIMAZE) injection, heparin, heparin, ondansetron (ZOFRAN) IV, RESOURCE THICKENUP CLEAR, sodium chloride, sodium chloride flush   Assessment/Plan   Wesley Tyler is a 65 y.o. male sent to [facility] after PCP labs came back abnormal with mildly elevated troponin. Was found to be in florid HF and transferred to [facility] for further work up. He has no known medical history, as he has not seen an MD in > 40  years.  1. Acute systolic CHF/cardiogenic shock: Steadily worsening dyspnea over the last month, no clear trigger. Markedly worse 3/28, came to ER. EF 10% on bedside echo. Noted to be in atrial fibrillation and hypotensive, started initially on phenylephrine but transitioned over to dobutamine. No chest pain, slight troponin elevation is likely explained by cardiogenic shock/volume overload. Ddx for cardiomyopathy = coronary disease (?chronic) versus myocarditis versus tachy-mediated with atrial fibrillation of uncertain duration. Required Impella placement with low cardiac output on 3/29 (CI 1.3 by thermodilution on dobutamine).  He decompensated and was intubated. Remains on norepinephrine and milrinone, off vasopressin. Impella removed 4/5. CVP 4-5 now with CVVH and weight down.  - CO-OX 67%-> 53% 36% -> 29% despite milrinone 0.25 mcg/kg/min.   - He acutely worsened. Suspect his passing is imminent.  2. Acute hypoxic respiratory failure:  Desatting into 50-60s. Currently on BiPAP. No re-intubation.  3. AKI: Suspect ATN, cardiorenal/related to low output HF and hypotension. Currently on CVVH. Remains anuric.  4. Atrial fibrillation: On heparin and amiodarone gtt for rate control.  Uncertain duration of atrial fibrillation, cannot rule out tachy-mediated CMP. On amio 200 mg twice a day. 5. Endo: Elevated TSH and mildly elevated free T4. ?Sick euthyroid.  6. Elevated LFTs: Suspect shock liver.  LFTs rose prior to amiodarone initiation but LFTs coming down now. Bili up to 13. Half direct/indirect. RUQ u/s 4/9 with sludge.  Suspect RV failure major issue. .   7. ID: Probable sepsis, fever 3/30,. Blood cultures negative.. Group C Strep in sputum cultures, finished nafcillin. WBC climbing. Repeat BCx pending. Suspect he is septic again. .  8. Hyponatremia- Na 138  Patient is actively dying from severe biventricular failure. Will discuss with family.   Length of Stay: [address]  Graciella Freer  PA-C 2015-06-05, 7:35 AM  Advanced Heart Failure Team Pager [phone] (M-F; 7a - 4p)  Please contact [facility] for night-coverage after hours (4p -7a ) and weekends on [email or web address]   Patient seen and examined with Otilio Saber, PA-C. We discussed all aspects of the encounter. I agree with the assessment and plan as stated above.   Patient actively dying with recurrent shock/respiratory failure. Discussed with his cousin. Given multi-system organ failure, have decided to priceed with comfort care. Will start morphine and versed gtts.   The patient is critically ill with multiple organ systems failure and requires high complexity decision making for assessment and support, frequent evaluation and titration of  therapies, application of advanced monitoring technologies and extensive interpretation of multiple databases.   Critical Care Time devoted to patient care services described in this note is 35 Minutes.   Shay Bartoli,MD 7:41 AM

## 2015-05-28 NOTE — Progress Notes (Signed)
Patient started labored breathing this morning but was able to follow commands .Md informed about low coox of 36.5 and gave order to increase levophed gtt. CCM called regarding labored breathing and order was received to place patient on bipap and stat chest xray was obtained. Respiratory tried but couldn't not get an abg on this patient. Dr Gala RomneyBensimhon came to bedside and called the family. Decision was made to withdraw care and keep patient comfortable. Family member, Aurther Lofterry, at bedside to support patient. Morphine and versed given to patient to keep him comfortable.

## 2015-05-28 NOTE — Progress Notes (Signed)
Chaplain paged, informed that pt was declining after extended stay in [facility] ICU.  Chaplain first informed unit of shift change and asked them to page again if pt changed to actively dying.  Upon second page, chaplain reported to unit.  Pt laying in bed in bi-pap mask, spouse at bedside grieving appropriately.  Spouse shares that pt never had children since pt's father had ALS, pt did not want potential for his children to experience what pt had experienced with his father's illness.  Pt left [place] job in [place] to move to [place] and take care of his mother until she passed.  It was at this time that pt stopped going to church regularly.  Spouse shares that pt still considered self a [place] and faith was important to pt.  Spouse requested chaplain to pray and upon conclusion of prayer, pt had passed.  Spiritual and emotional care provided through supportive presence, affirmation of spouse's faith, prayer and story as well as empathetic and reflective listening.  Chaplain will report to chaplain coming on shift for possible follow-up spiritual care needs.  Erroll Lunavercash, Rayanne Padmanabhan A, Chaplain 05/27/2015 8:18 AM

## 2015-05-28 NOTE — Progress Notes (Signed)
ANTICOAGULATION CONSULT NOTE - Follow Up Consult  Pharmacy Consult for heparin Indication: atrial fibrillation  Labs:  Recent Labs  05/05/15 0427  05/06/15 0402 05/06/15 0933 05/06/15 1705 05/06/15 1839 05/12/2015 0430  HGB 12.6*  --  13.9  --   --   --  15.1  HCT 37.6*  --  40.2  --   --   --  43.5  PLT 271  --  362  --   --   --  419*  HEPARINUNFRC <0.10*  < >  --  0.97*  --  1.26* 1.20*  CREATININE 2.09*  < > 2.19*  --  2.07*  --  1.87*  < > = values in this interval not displayed.   Assessment: 65yo male remains supratherapeutic on heparin with very little change in level despite holding last night and resuming at lower rate.  Goal of Therapy:  Heparin level 0.3-0.7 units/ml   Plan:  Will hold heparin gtt until 0800 then resume heparin at significantly lower rate of 1000 units/hr and check level in 8hr.  Vernard GamblesVeronda Jevan Gaunt, PharmD, BCPS  05/10/2015,6:51 AM

## 2015-05-28 DEATH — deceased

## 2015-10-27 LAB — ECHO TEE
HEIGHTINCHES: 76 in
WEIGHTICAEL: 3559.11 [oz_av]

## 2017-09-07 IMAGING — CR DG CHEST 1V PORT
2 series · 2 of 2 positions shown · non-contrast
Comparison: Chest radiographs earlier this day.

CLINICAL DATA: Re-adjustment of Swan-Ganz catheter.

EXAM:
PORTABLE CHEST 1 VIEW

[AP (1 of 2)]
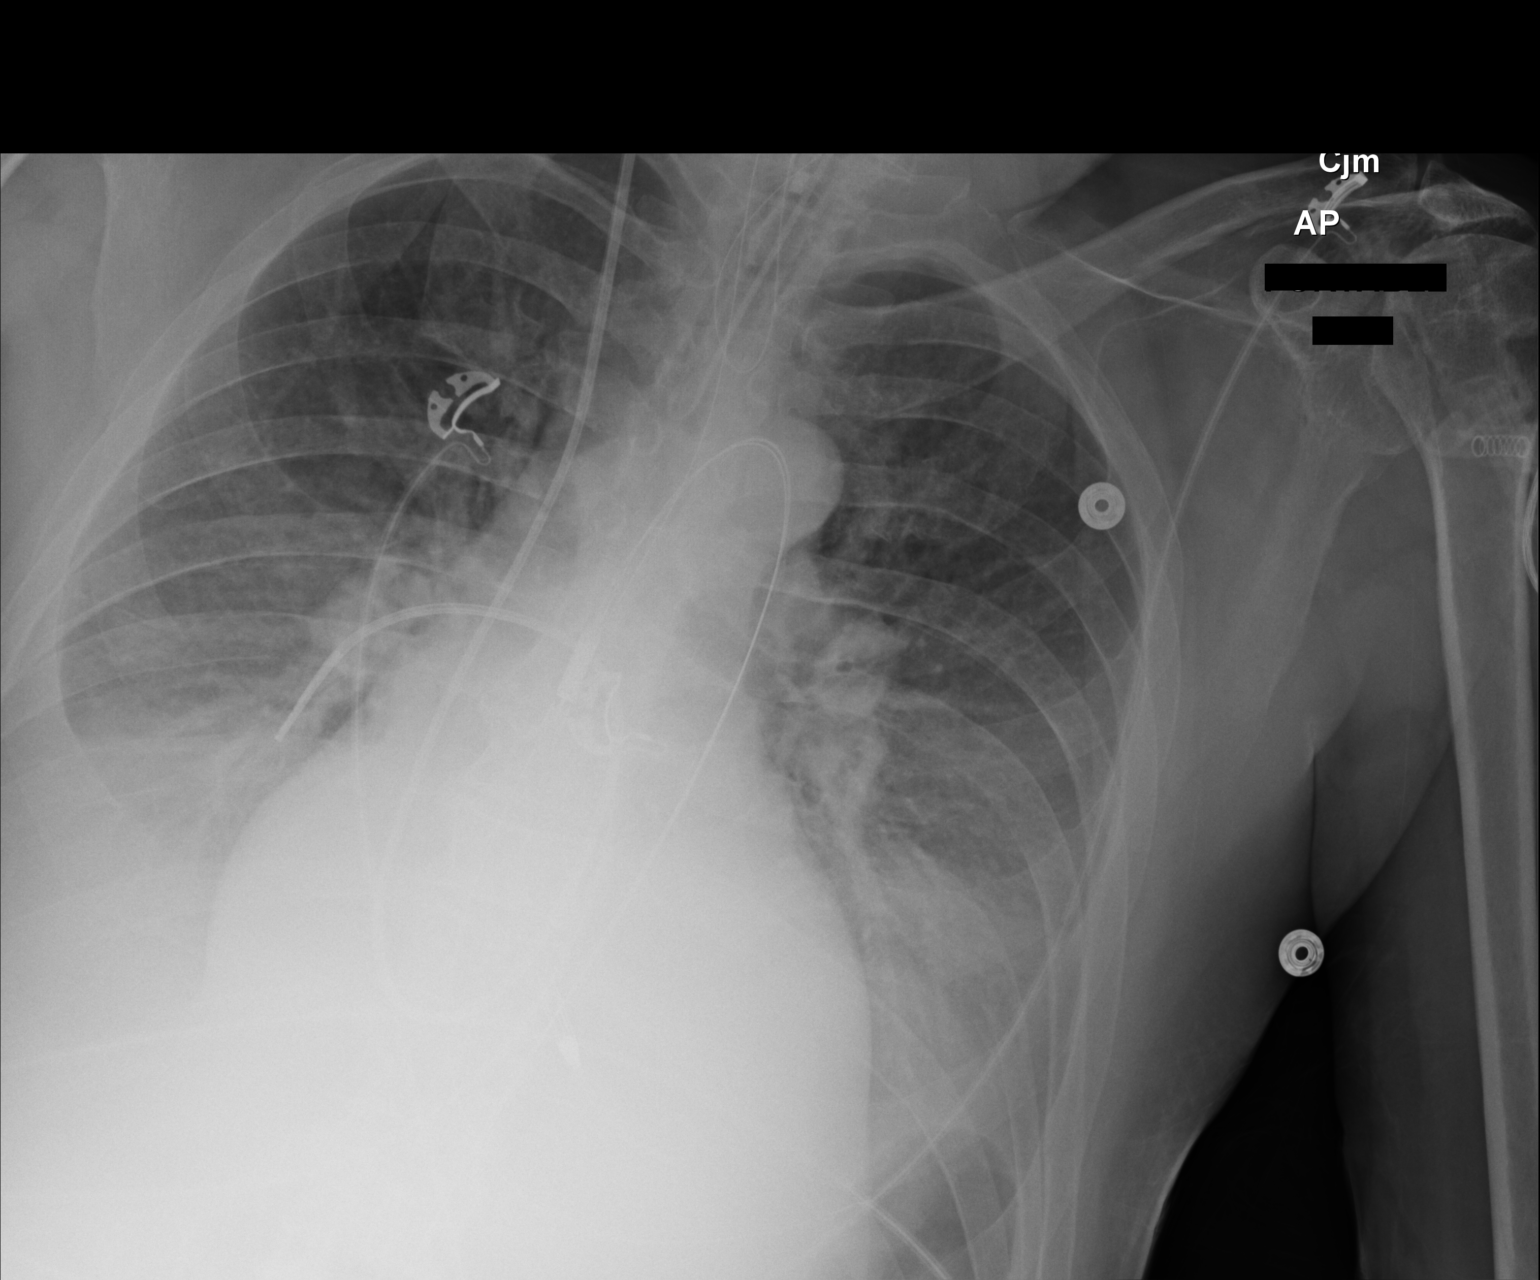

[AP (2 of 2)]
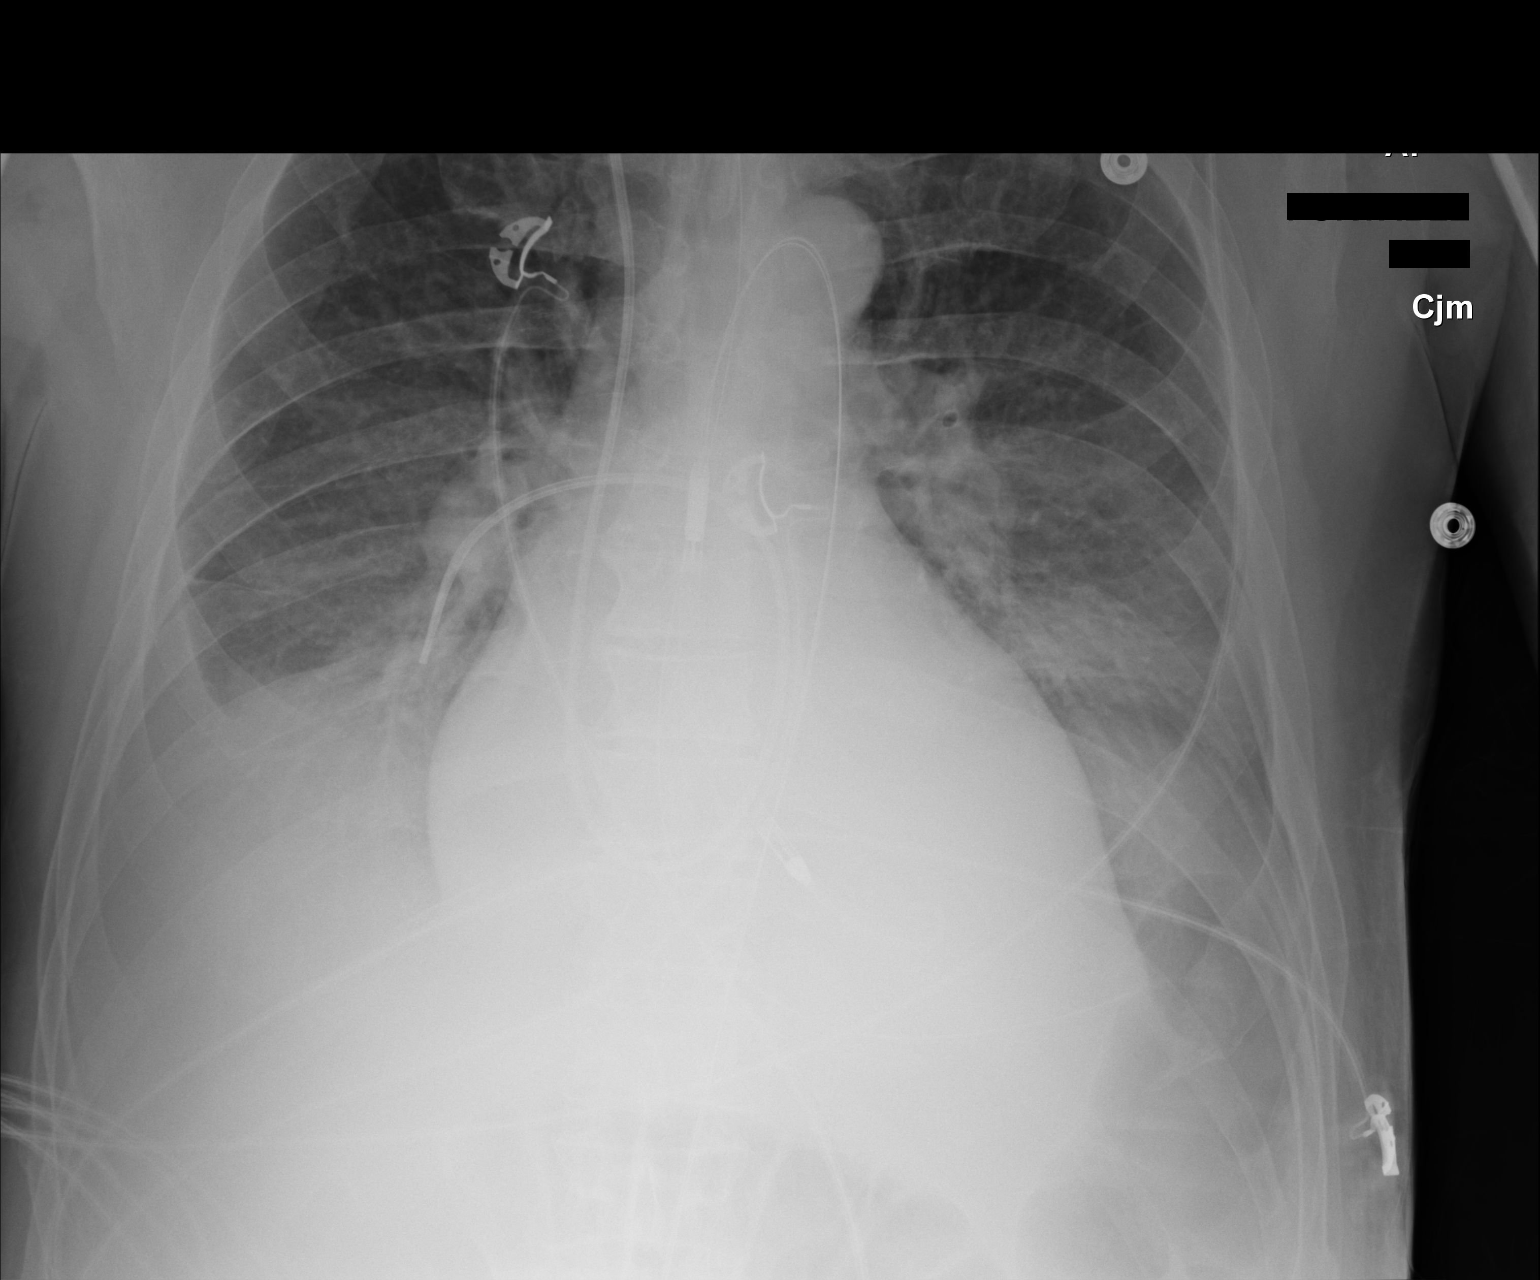

[2 of 2 positions shown; findings below may reference images not displayed]

Please note
examination is compared to chest radiographs with same time stamp
[DATE], confirmed with technologist this exam was performed after
previous chest radiograph.
FINDINGS: The tip of the right Swan-Ganz catheter appears unchanged in
position, located peripherally in the expected location of right
lower lobe pulmonary artery. Endotracheal tube 4.3 cm from the
carina. Tip and side port of the enteric tube below the diaphragm in
the stomach. Cardiomediastinal contours are unchanged with stable
cardiomegaly. Bilateral pleural effusions and bibasilar opacities.
Pulmonary vasculature is unchanged. No pneumothorax.
IMPRESSION: 1. Tip of the right Swan-Ganz catheter appears unchanged in
position, peripherally located in the expected location of the right
lower lobe pulmonary artery.
2. The exam is otherwise unchanged. Bilateral pleural effusions and
bibasilar opacities again seen.
Please note the timing of this exam was confirmed with technologist,
chronologically out of order in PACS timeline.

## 2017-09-14 IMAGING — CR DG CHEST 1V PORT
1 series · 1 of 1 positions shown · non-contrast
Comparison: 05/06/2015

CLINICAL DATA: Desaturation.  Shortness of breath

EXAM:
PORTABLE CHEST 1 VIEW

[AP]
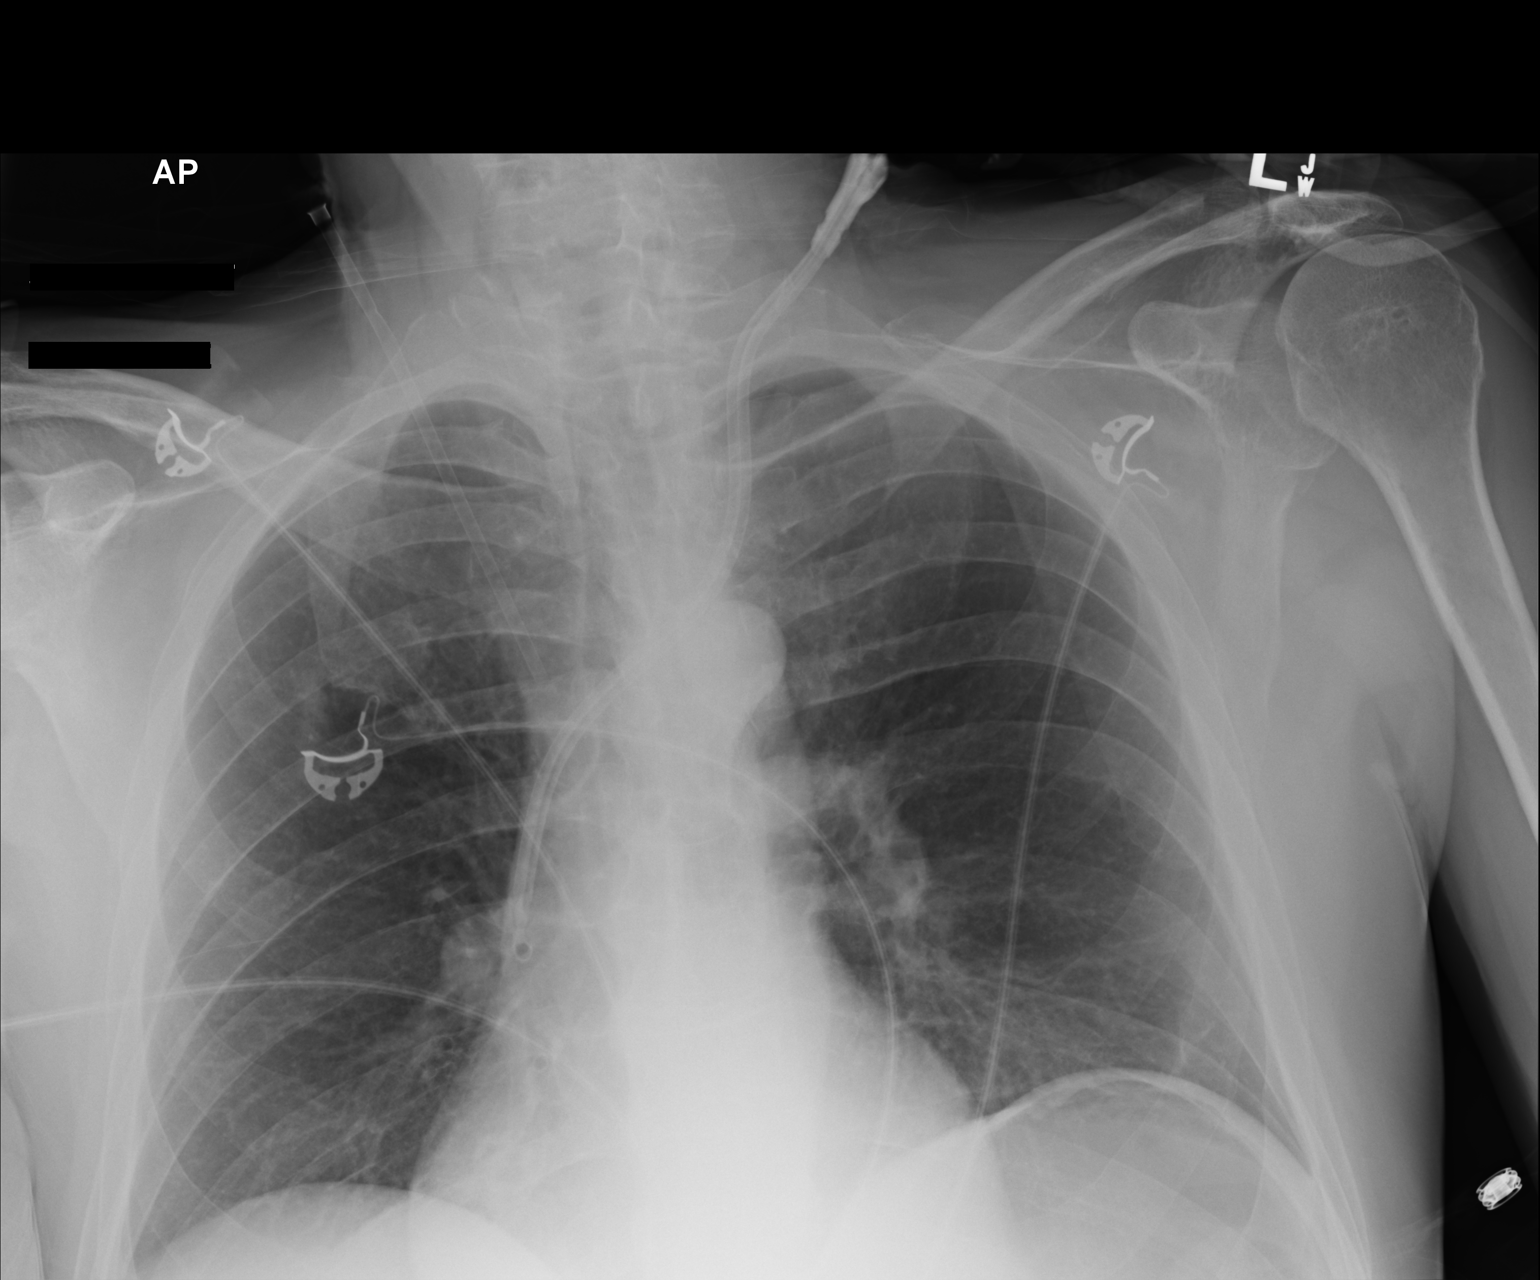

[1 of 1 positions shown; findings below may reference images not displayed]

FINDINGS: Bilateral internal jugular central lines remain in place, unchanged.
Heart is normal size. No confluent opacities or effusions. No acute
bony abnormality.
IMPRESSION: No active disease.
